# Patient Record
Sex: Female | Born: 1955
Health system: Southern US, Community
[De-identification: ages and names within clinical notes are randomized; demographics above are authoritative.]

## PROBLEM LIST (undated history)

## (undated) DIAGNOSIS — E119 Type 2 diabetes mellitus without complications: Secondary | ICD-10-CM

## (undated) DIAGNOSIS — I10 Essential (primary) hypertension: Secondary | ICD-10-CM

## (undated) DIAGNOSIS — M5431 Sciatica, right side: Secondary | ICD-10-CM

## (undated) DIAGNOSIS — N3090 Cystitis, unspecified without hematuria: Secondary | ICD-10-CM

## (undated) HISTORY — DX: Cystitis, unspecified without hematuria: N30.90

## (undated) HISTORY — DX: Sciatica, right side: M54.31

## (undated) HISTORY — DX: Essential (primary) hypertension: I10

## (undated) HISTORY — PX: CATARACT EXTRACTION, BILATERAL: SHX1313

## (undated) HISTORY — PX: TONSILLECTOMY AND ADENOIDECTOMY: SUR1326

---
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
## 1998-06-25 HISTORY — PX: APPENDECTOMY: SHX54

## 1998-06-25 HISTORY — PX: CHOLECYSTECTOMY: SHX55

## 2012-09-04 DIAGNOSIS — Z9102 Food additives allergy status: Secondary | ICD-10-CM

## 2012-09-04 DIAGNOSIS — Z91010 Allergy to peanuts: Secondary | ICD-10-CM | POA: Insufficient documentation

## 2012-09-04 DIAGNOSIS — K9049 Malabsorption due to intolerance, not elsewhere classified: Secondary | ICD-10-CM | POA: Insufficient documentation

## 2012-09-04 HISTORY — DX: Food additives allergy status: Z91.02

## 2012-09-04 HISTORY — DX: Allergy to peanuts: Z91.010

## 2012-10-16 DIAGNOSIS — E559 Vitamin D deficiency, unspecified: Secondary | ICD-10-CM | POA: Insufficient documentation

## 2013-10-28 DIAGNOSIS — M2550 Pain in unspecified joint: Secondary | ICD-10-CM | POA: Insufficient documentation

## 2013-10-28 DIAGNOSIS — I6529 Occlusion and stenosis of unspecified carotid artery: Secondary | ICD-10-CM | POA: Insufficient documentation

## 2013-10-28 HISTORY — DX: Occlusion and stenosis of unspecified carotid artery: I65.29

## 2014-05-12 DIAGNOSIS — G629 Polyneuropathy, unspecified: Secondary | ICD-10-CM | POA: Insufficient documentation

## 2014-05-12 DIAGNOSIS — T452X1A Poisoning by vitamins, accidental (unintentional), initial encounter: Secondary | ICD-10-CM | POA: Insufficient documentation

## 2014-05-12 HISTORY — DX: Polyneuropathy, unspecified: G62.9

## 2014-05-12 HISTORY — DX: Poisoning by vitamins, accidental (unintentional), initial encounter: T45.2X1A

## 2016-08-04 DIAGNOSIS — J209 Acute bronchitis, unspecified: Secondary | ICD-10-CM | POA: Diagnosis not present

## 2016-08-14 DIAGNOSIS — F0789 Other personality and behavioral disorders due to known physiological condition: Secondary | ICD-10-CM | POA: Diagnosis not present

## 2016-08-14 DIAGNOSIS — E782 Mixed hyperlipidemia: Secondary | ICD-10-CM | POA: Diagnosis not present

## 2016-08-14 DIAGNOSIS — I10 Essential (primary) hypertension: Secondary | ICD-10-CM | POA: Diagnosis not present

## 2016-08-14 DIAGNOSIS — E109 Type 1 diabetes mellitus without complications: Secondary | ICD-10-CM | POA: Diagnosis not present

## 2016-08-17 DIAGNOSIS — E559 Vitamin D deficiency, unspecified: Secondary | ICD-10-CM | POA: Diagnosis not present

## 2016-08-17 DIAGNOSIS — I10 Essential (primary) hypertension: Secondary | ICD-10-CM | POA: Diagnosis not present

## 2016-08-17 DIAGNOSIS — E119 Type 2 diabetes mellitus without complications: Secondary | ICD-10-CM | POA: Diagnosis not present

## 2016-08-17 DIAGNOSIS — E782 Mixed hyperlipidemia: Secondary | ICD-10-CM | POA: Diagnosis not present

## 2016-08-27 DIAGNOSIS — E876 Hypokalemia: Secondary | ICD-10-CM | POA: Diagnosis not present

## 2016-08-27 DIAGNOSIS — E119 Type 2 diabetes mellitus without complications: Secondary | ICD-10-CM | POA: Diagnosis not present

## 2016-08-27 DIAGNOSIS — J018 Other acute sinusitis: Secondary | ICD-10-CM | POA: Diagnosis not present

## 2016-08-27 DIAGNOSIS — J4541 Moderate persistent asthma with (acute) exacerbation: Secondary | ICD-10-CM | POA: Diagnosis not present

## 2016-08-28 DIAGNOSIS — J4541 Moderate persistent asthma with (acute) exacerbation: Secondary | ICD-10-CM | POA: Diagnosis not present

## 2016-10-01 DIAGNOSIS — G243 Spasmodic torticollis: Secondary | ICD-10-CM | POA: Diagnosis not present

## 2016-12-14 DIAGNOSIS — I10 Essential (primary) hypertension: Secondary | ICD-10-CM | POA: Diagnosis not present

## 2016-12-14 DIAGNOSIS — E119 Type 2 diabetes mellitus without complications: Secondary | ICD-10-CM | POA: Diagnosis not present

## 2016-12-14 DIAGNOSIS — Y800 Diagnostic and monitoring physical medicine devices associated with adverse incidents: Secondary | ICD-10-CM | POA: Diagnosis not present

## 2016-12-14 DIAGNOSIS — E782 Mixed hyperlipidemia: Secondary | ICD-10-CM | POA: Diagnosis not present

## 2017-01-07 DIAGNOSIS — Z882 Allergy status to sulfonamides status: Secondary | ICD-10-CM | POA: Diagnosis not present

## 2017-01-07 DIAGNOSIS — E119 Type 2 diabetes mellitus without complications: Secondary | ICD-10-CM | POA: Diagnosis not present

## 2017-01-07 DIAGNOSIS — G243 Spasmodic torticollis: Secondary | ICD-10-CM | POA: Diagnosis not present

## 2017-01-07 DIAGNOSIS — G248 Other dystonia: Secondary | ICD-10-CM | POA: Diagnosis not present

## 2017-04-03 DIAGNOSIS — G243 Spasmodic torticollis: Secondary | ICD-10-CM | POA: Insufficient documentation

## 2017-04-03 HISTORY — DX: Spasmodic torticollis: G24.3

## 2017-04-15 DIAGNOSIS — G243 Spasmodic torticollis: Secondary | ICD-10-CM | POA: Diagnosis not present

## 2017-04-15 DIAGNOSIS — Z885 Allergy status to narcotic agent status: Secondary | ICD-10-CM | POA: Diagnosis not present

## 2017-04-15 DIAGNOSIS — E119 Type 2 diabetes mellitus without complications: Secondary | ICD-10-CM | POA: Diagnosis not present

## 2017-04-15 DIAGNOSIS — Z888 Allergy status to other drugs, medicaments and biological substances status: Secondary | ICD-10-CM | POA: Diagnosis not present

## 2017-04-15 DIAGNOSIS — I10 Essential (primary) hypertension: Secondary | ICD-10-CM | POA: Diagnosis not present

## 2017-04-15 DIAGNOSIS — Z880 Allergy status to penicillin: Secondary | ICD-10-CM | POA: Diagnosis not present

## 2017-04-15 DIAGNOSIS — J4541 Moderate persistent asthma with (acute) exacerbation: Secondary | ICD-10-CM | POA: Diagnosis not present

## 2017-04-15 DIAGNOSIS — Z79899 Other long term (current) drug therapy: Secondary | ICD-10-CM | POA: Diagnosis not present

## 2017-04-15 DIAGNOSIS — Z882 Allergy status to sulfonamides status: Secondary | ICD-10-CM | POA: Diagnosis not present

## 2017-04-15 DIAGNOSIS — Z91048 Other nonmedicinal substance allergy status: Secondary | ICD-10-CM | POA: Diagnosis not present

## 2017-04-15 DIAGNOSIS — Z794 Long term (current) use of insulin: Secondary | ICD-10-CM | POA: Diagnosis not present

## 2017-04-15 DIAGNOSIS — K219 Gastro-esophageal reflux disease without esophagitis: Secondary | ICD-10-CM | POA: Diagnosis not present

## 2017-04-15 DIAGNOSIS — E782 Mixed hyperlipidemia: Secondary | ICD-10-CM | POA: Diagnosis not present

## 2017-04-30 DIAGNOSIS — Z0000 Encounter for general adult medical examination without abnormal findings: Secondary | ICD-10-CM | POA: Diagnosis not present

## 2017-04-30 DIAGNOSIS — Z6830 Body mass index (BMI) 30.0-30.9, adult: Secondary | ICD-10-CM | POA: Diagnosis not present

## 2017-04-30 DIAGNOSIS — Z124 Encounter for screening for malignant neoplasm of cervix: Secondary | ICD-10-CM | POA: Diagnosis not present

## 2017-05-09 DIAGNOSIS — Z1231 Encounter for screening mammogram for malignant neoplasm of breast: Secondary | ICD-10-CM | POA: Diagnosis not present

## 2017-05-24 DIAGNOSIS — J4541 Moderate persistent asthma with (acute) exacerbation: Secondary | ICD-10-CM | POA: Diagnosis not present

## 2017-05-27 DIAGNOSIS — H26493 Other secondary cataract, bilateral: Secondary | ICD-10-CM | POA: Diagnosis not present

## 2017-05-27 DIAGNOSIS — H40003 Preglaucoma, unspecified, bilateral: Secondary | ICD-10-CM | POA: Diagnosis not present

## 2017-05-27 DIAGNOSIS — H04123 Dry eye syndrome of bilateral lacrimal glands: Secondary | ICD-10-CM | POA: Diagnosis not present

## 2017-05-27 DIAGNOSIS — E119 Type 2 diabetes mellitus without complications: Secondary | ICD-10-CM | POA: Diagnosis not present

## 2017-07-17 DIAGNOSIS — H40053 Ocular hypertension, bilateral: Secondary | ICD-10-CM | POA: Diagnosis not present

## 2017-07-29 DIAGNOSIS — G243 Spasmodic torticollis: Secondary | ICD-10-CM | POA: Diagnosis not present

## 2017-08-29 DIAGNOSIS — H40053 Ocular hypertension, bilateral: Secondary | ICD-10-CM | POA: Diagnosis not present

## 2017-10-10 DIAGNOSIS — H40053 Ocular hypertension, bilateral: Secondary | ICD-10-CM | POA: Diagnosis not present

## 2017-11-04 DIAGNOSIS — G243 Spasmodic torticollis: Secondary | ICD-10-CM | POA: Diagnosis not present

## 2018-02-10 DIAGNOSIS — E119 Type 2 diabetes mellitus without complications: Secondary | ICD-10-CM | POA: Diagnosis not present

## 2018-02-10 DIAGNOSIS — Z880 Allergy status to penicillin: Secondary | ICD-10-CM | POA: Diagnosis not present

## 2018-02-10 DIAGNOSIS — Z882 Allergy status to sulfonamides status: Secondary | ICD-10-CM | POA: Diagnosis not present

## 2018-02-10 DIAGNOSIS — G243 Spasmodic torticollis: Secondary | ICD-10-CM | POA: Diagnosis not present

## 2018-02-21 LAB — FECAL OCCULT BLOOD, IMMUNOCHEMICAL: IFOBT: NEGATIVE

## 2018-02-25 DIAGNOSIS — Z0000 Encounter for general adult medical examination without abnormal findings: Secondary | ICD-10-CM | POA: Diagnosis not present

## 2018-04-02 DIAGNOSIS — N3001 Acute cystitis with hematuria: Secondary | ICD-10-CM | POA: Diagnosis not present

## 2018-04-16 DIAGNOSIS — H40053 Ocular hypertension, bilateral: Secondary | ICD-10-CM | POA: Diagnosis not present

## 2018-04-17 DIAGNOSIS — N3000 Acute cystitis without hematuria: Secondary | ICD-10-CM | POA: Diagnosis not present

## 2018-05-01 DIAGNOSIS — I10 Essential (primary) hypertension: Secondary | ICD-10-CM | POA: Diagnosis not present

## 2018-05-01 DIAGNOSIS — J4541 Moderate persistent asthma with (acute) exacerbation: Secondary | ICD-10-CM | POA: Diagnosis not present

## 2018-05-01 DIAGNOSIS — E1169 Type 2 diabetes mellitus with other specified complication: Secondary | ICD-10-CM | POA: Diagnosis not present

## 2018-05-01 DIAGNOSIS — R790 Abnormal level of blood mineral: Secondary | ICD-10-CM | POA: Diagnosis not present

## 2018-05-01 DIAGNOSIS — Z6831 Body mass index (BMI) 31.0-31.9, adult: Secondary | ICD-10-CM | POA: Diagnosis not present

## 2018-05-01 DIAGNOSIS — E782 Mixed hyperlipidemia: Secondary | ICD-10-CM | POA: Diagnosis not present

## 2018-05-01 DIAGNOSIS — Z0000 Encounter for general adult medical examination without abnormal findings: Secondary | ICD-10-CM | POA: Diagnosis not present

## 2018-05-06 DIAGNOSIS — E119 Type 2 diabetes mellitus without complications: Secondary | ICD-10-CM | POA: Diagnosis not present

## 2018-05-12 DIAGNOSIS — G243 Spasmodic torticollis: Secondary | ICD-10-CM | POA: Diagnosis not present

## 2018-05-12 DIAGNOSIS — Z888 Allergy status to other drugs, medicaments and biological substances status: Secondary | ICD-10-CM | POA: Diagnosis not present

## 2018-05-12 DIAGNOSIS — K219 Gastro-esophageal reflux disease without esophagitis: Secondary | ICD-10-CM | POA: Diagnosis not present

## 2018-05-12 DIAGNOSIS — E119 Type 2 diabetes mellitus without complications: Secondary | ICD-10-CM | POA: Diagnosis not present

## 2018-05-12 DIAGNOSIS — Z880 Allergy status to penicillin: Secondary | ICD-10-CM | POA: Diagnosis not present

## 2018-05-12 DIAGNOSIS — Z882 Allergy status to sulfonamides status: Secondary | ICD-10-CM | POA: Diagnosis not present

## 2018-05-19 DIAGNOSIS — N3001 Acute cystitis with hematuria: Secondary | ICD-10-CM | POA: Diagnosis not present

## 2018-05-19 DIAGNOSIS — N3000 Acute cystitis without hematuria: Secondary | ICD-10-CM | POA: Diagnosis not present

## 2018-05-26 DIAGNOSIS — Z1231 Encounter for screening mammogram for malignant neoplasm of breast: Secondary | ICD-10-CM | POA: Diagnosis not present

## 2018-06-04 DIAGNOSIS — E119 Type 2 diabetes mellitus without complications: Secondary | ICD-10-CM | POA: Diagnosis not present

## 2018-06-20 DIAGNOSIS — N3000 Acute cystitis without hematuria: Secondary | ICD-10-CM | POA: Diagnosis not present

## 2018-06-20 DIAGNOSIS — N3001 Acute cystitis with hematuria: Secondary | ICD-10-CM | POA: Diagnosis not present

## 2018-06-30 DIAGNOSIS — E119 Type 2 diabetes mellitus without complications: Secondary | ICD-10-CM | POA: Diagnosis not present

## 2018-07-28 DIAGNOSIS — E119 Type 2 diabetes mellitus without complications: Secondary | ICD-10-CM | POA: Diagnosis not present

## 2018-07-29 DIAGNOSIS — N3000 Acute cystitis without hematuria: Secondary | ICD-10-CM | POA: Diagnosis not present

## 2018-08-06 DIAGNOSIS — B373 Candidiasis of vulva and vagina: Secondary | ICD-10-CM | POA: Diagnosis not present

## 2018-08-06 DIAGNOSIS — Z889 Allergy status to unspecified drugs, medicaments and biological substances status: Secondary | ICD-10-CM | POA: Diagnosis not present

## 2018-08-06 DIAGNOSIS — N390 Urinary tract infection, site not specified: Secondary | ICD-10-CM | POA: Diagnosis not present

## 2018-08-06 DIAGNOSIS — Z79899 Other long term (current) drug therapy: Secondary | ICD-10-CM | POA: Diagnosis not present

## 2018-08-08 DIAGNOSIS — M5431 Sciatica, right side: Secondary | ICD-10-CM | POA: Diagnosis not present

## 2018-08-14 DIAGNOSIS — M5431 Sciatica, right side: Secondary | ICD-10-CM | POA: Diagnosis not present

## 2018-08-18 DIAGNOSIS — Z881 Allergy status to other antibiotic agents status: Secondary | ICD-10-CM | POA: Diagnosis not present

## 2018-08-18 DIAGNOSIS — Z884 Allergy status to anesthetic agent status: Secondary | ICD-10-CM | POA: Diagnosis not present

## 2018-08-18 DIAGNOSIS — Z888 Allergy status to other drugs, medicaments and biological substances status: Secondary | ICD-10-CM | POA: Diagnosis not present

## 2018-08-18 DIAGNOSIS — Z880 Allergy status to penicillin: Secondary | ICD-10-CM | POA: Diagnosis not present

## 2018-08-18 DIAGNOSIS — E119 Type 2 diabetes mellitus without complications: Secondary | ICD-10-CM | POA: Diagnosis not present

## 2018-08-18 DIAGNOSIS — Z882 Allergy status to sulfonamides status: Secondary | ICD-10-CM | POA: Diagnosis not present

## 2018-08-18 DIAGNOSIS — G243 Spasmodic torticollis: Secondary | ICD-10-CM | POA: Diagnosis not present

## 2018-08-20 DIAGNOSIS — M545 Low back pain: Secondary | ICD-10-CM | POA: Diagnosis not present

## 2018-08-20 DIAGNOSIS — M5441 Lumbago with sciatica, right side: Secondary | ICD-10-CM | POA: Diagnosis not present

## 2018-08-21 DIAGNOSIS — E119 Type 2 diabetes mellitus without complications: Secondary | ICD-10-CM | POA: Diagnosis not present

## 2018-08-25 DIAGNOSIS — M62838 Other muscle spasm: Secondary | ICD-10-CM | POA: Diagnosis not present

## 2018-08-25 DIAGNOSIS — M5441 Lumbago with sciatica, right side: Secondary | ICD-10-CM | POA: Diagnosis not present

## 2018-08-25 DIAGNOSIS — M545 Low back pain: Secondary | ICD-10-CM | POA: Diagnosis not present

## 2018-09-02 DIAGNOSIS — N390 Urinary tract infection, site not specified: Secondary | ICD-10-CM | POA: Diagnosis not present

## 2018-09-02 DIAGNOSIS — B373 Candidiasis of vulva and vagina: Secondary | ICD-10-CM | POA: Diagnosis not present

## 2018-09-03 DIAGNOSIS — M5441 Lumbago with sciatica, right side: Secondary | ICD-10-CM | POA: Diagnosis not present

## 2018-09-03 DIAGNOSIS — M545 Low back pain: Secondary | ICD-10-CM | POA: Diagnosis not present

## 2018-09-09 DIAGNOSIS — F0789 Other personality and behavioral disorders due to known physiological condition: Secondary | ICD-10-CM | POA: Diagnosis not present

## 2018-09-09 DIAGNOSIS — E782 Mixed hyperlipidemia: Secondary | ICD-10-CM | POA: Diagnosis not present

## 2018-09-09 DIAGNOSIS — I10 Essential (primary) hypertension: Secondary | ICD-10-CM | POA: Diagnosis not present

## 2018-09-09 DIAGNOSIS — E1169 Type 2 diabetes mellitus with other specified complication: Secondary | ICD-10-CM | POA: Diagnosis not present

## 2018-09-10 DIAGNOSIS — M545 Low back pain: Secondary | ICD-10-CM | POA: Diagnosis not present

## 2018-09-10 DIAGNOSIS — M5441 Lumbago with sciatica, right side: Secondary | ICD-10-CM | POA: Diagnosis not present

## 2018-09-15 DIAGNOSIS — E119 Type 2 diabetes mellitus without complications: Secondary | ICD-10-CM | POA: Diagnosis not present

## 2018-10-11 DIAGNOSIS — E119 Type 2 diabetes mellitus without complications: Secondary | ICD-10-CM | POA: Diagnosis not present

## 2018-10-27 DIAGNOSIS — Z889 Allergy status to unspecified drugs, medicaments and biological substances status: Secondary | ICD-10-CM | POA: Diagnosis not present

## 2018-10-27 DIAGNOSIS — N390 Urinary tract infection, site not specified: Secondary | ICD-10-CM | POA: Diagnosis not present

## 2018-11-12 DIAGNOSIS — E119 Type 2 diabetes mellitus without complications: Secondary | ICD-10-CM | POA: Diagnosis not present

## 2018-11-13 DIAGNOSIS — E119 Type 2 diabetes mellitus without complications: Secondary | ICD-10-CM | POA: Diagnosis not present

## 2018-11-24 DIAGNOSIS — E119 Type 2 diabetes mellitus without complications: Secondary | ICD-10-CM | POA: Diagnosis not present

## 2018-11-24 DIAGNOSIS — G243 Spasmodic torticollis: Secondary | ICD-10-CM | POA: Diagnosis not present

## 2018-11-24 DIAGNOSIS — K219 Gastro-esophageal reflux disease without esophagitis: Secondary | ICD-10-CM | POA: Diagnosis not present

## 2018-12-04 DIAGNOSIS — N390 Urinary tract infection, site not specified: Secondary | ICD-10-CM | POA: Diagnosis not present

## 2018-12-04 DIAGNOSIS — B373 Candidiasis of vulva and vagina: Secondary | ICD-10-CM | POA: Diagnosis not present

## 2018-12-08 DIAGNOSIS — E119 Type 2 diabetes mellitus without complications: Secondary | ICD-10-CM | POA: Diagnosis not present

## 2019-01-03 DIAGNOSIS — E119 Type 2 diabetes mellitus without complications: Secondary | ICD-10-CM | POA: Diagnosis not present

## 2019-01-05 DIAGNOSIS — N3000 Acute cystitis without hematuria: Secondary | ICD-10-CM | POA: Diagnosis not present

## 2019-01-05 DIAGNOSIS — N3001 Acute cystitis with hematuria: Secondary | ICD-10-CM | POA: Diagnosis not present

## 2019-01-05 DIAGNOSIS — B3741 Candidal cystitis and urethritis: Secondary | ICD-10-CM | POA: Diagnosis not present

## 2019-01-22 DIAGNOSIS — H40053 Ocular hypertension, bilateral: Secondary | ICD-10-CM | POA: Diagnosis not present

## 2019-02-05 DIAGNOSIS — E119 Type 2 diabetes mellitus without complications: Secondary | ICD-10-CM | POA: Diagnosis not present

## 2019-03-09 DIAGNOSIS — Z880 Allergy status to penicillin: Secondary | ICD-10-CM | POA: Diagnosis not present

## 2019-03-09 DIAGNOSIS — E119 Type 2 diabetes mellitus without complications: Secondary | ICD-10-CM | POA: Diagnosis not present

## 2019-03-09 DIAGNOSIS — G243 Spasmodic torticollis: Secondary | ICD-10-CM | POA: Diagnosis not present

## 2019-03-09 DIAGNOSIS — Z882 Allergy status to sulfonamides status: Secondary | ICD-10-CM | POA: Diagnosis not present

## 2019-03-12 DIAGNOSIS — E119 Type 2 diabetes mellitus without complications: Secondary | ICD-10-CM | POA: Diagnosis not present

## 2019-03-19 DIAGNOSIS — E1169 Type 2 diabetes mellitus with other specified complication: Secondary | ICD-10-CM | POA: Diagnosis not present

## 2019-03-19 DIAGNOSIS — I10 Essential (primary) hypertension: Secondary | ICD-10-CM | POA: Diagnosis not present

## 2019-03-19 DIAGNOSIS — J4540 Moderate persistent asthma, uncomplicated: Secondary | ICD-10-CM | POA: Diagnosis not present

## 2019-03-19 DIAGNOSIS — Z1159 Encounter for screening for other viral diseases: Secondary | ICD-10-CM | POA: Diagnosis not present

## 2019-05-29 DIAGNOSIS — E119 Type 2 diabetes mellitus without complications: Secondary | ICD-10-CM | POA: Diagnosis not present

## 2019-06-08 DIAGNOSIS — G243 Spasmodic torticollis: Secondary | ICD-10-CM | POA: Diagnosis not present

## 2019-06-08 DIAGNOSIS — K219 Gastro-esophageal reflux disease without esophagitis: Secondary | ICD-10-CM | POA: Diagnosis not present

## 2019-06-08 DIAGNOSIS — E119 Type 2 diabetes mellitus without complications: Secondary | ICD-10-CM | POA: Diagnosis not present

## 2019-06-09 DIAGNOSIS — Z1231 Encounter for screening mammogram for malignant neoplasm of breast: Secondary | ICD-10-CM | POA: Diagnosis not present

## 2019-06-09 LAB — HM MAMMOGRAPHY

## 2019-06-15 DIAGNOSIS — R05 Cough: Secondary | ICD-10-CM | POA: Diagnosis not present

## 2019-06-15 DIAGNOSIS — Z20828 Contact with and (suspected) exposure to other viral communicable diseases: Secondary | ICD-10-CM | POA: Diagnosis not present

## 2019-06-15 DIAGNOSIS — R509 Fever, unspecified: Secondary | ICD-10-CM | POA: Diagnosis not present

## 2019-06-24 DIAGNOSIS — J018 Other acute sinusitis: Secondary | ICD-10-CM | POA: Diagnosis not present

## 2019-06-24 DIAGNOSIS — Z1159 Encounter for screening for other viral diseases: Secondary | ICD-10-CM | POA: Diagnosis not present

## 2019-06-24 DIAGNOSIS — R197 Diarrhea, unspecified: Secondary | ICD-10-CM | POA: Diagnosis not present

## 2019-06-24 DIAGNOSIS — Z6830 Body mass index (BMI) 30.0-30.9, adult: Secondary | ICD-10-CM | POA: Diagnosis not present

## 2019-06-29 DIAGNOSIS — E119 Type 2 diabetes mellitus without complications: Secondary | ICD-10-CM | POA: Diagnosis not present

## 2019-07-29 DIAGNOSIS — H40053 Ocular hypertension, bilateral: Secondary | ICD-10-CM | POA: Diagnosis not present

## 2019-09-14 DIAGNOSIS — G243 Spasmodic torticollis: Secondary | ICD-10-CM | POA: Diagnosis not present

## 2019-09-16 ENCOUNTER — Ambulatory Visit: Payer: Medicare Other | Admitting: Legal Medicine

## 2019-09-16 ENCOUNTER — Encounter: Payer: Self-pay | Admitting: Legal Medicine

## 2019-09-16 ENCOUNTER — Other Ambulatory Visit: Payer: Self-pay

## 2019-09-16 DIAGNOSIS — J4540 Moderate persistent asthma, uncomplicated: Secondary | ICD-10-CM

## 2019-09-16 DIAGNOSIS — E1121 Type 2 diabetes mellitus with diabetic nephropathy: Secondary | ICD-10-CM | POA: Insufficient documentation

## 2019-09-16 DIAGNOSIS — I10 Essential (primary) hypertension: Secondary | ICD-10-CM | POA: Diagnosis not present

## 2019-09-16 DIAGNOSIS — Z794 Long term (current) use of insulin: Secondary | ICD-10-CM | POA: Diagnosis not present

## 2019-09-16 DIAGNOSIS — E1169 Type 2 diabetes mellitus with other specified complication: Secondary | ICD-10-CM | POA: Diagnosis not present

## 2019-09-16 HISTORY — DX: Type 2 diabetes mellitus with other specified complication: E11.69

## 2019-09-16 HISTORY — DX: Essential (primary) hypertension: I10

## 2019-09-16 HISTORY — DX: Moderate persistent asthma, uncomplicated: J45.40

## 2019-09-16 NOTE — Assessment & Plan Note (Signed)
This patient has asthma moderate and is on albuterol.  Patient is not having a flair.  Chronic medicines include albuterol. Addition new medicines none.  Asthma action plan is in place.  

## 2019-09-16 NOTE — Assessment & Plan Note (Signed)

## 2019-09-16 NOTE — Assessment & Plan Note (Signed)

## 2019-09-16 NOTE — Progress Notes (Signed)
Established Patient Office Visit  Subjective:  Patient ID: Suzanne Hardin, female    DOB: 1956-06-09  Age: 64 y.o. MRN: [id number]  CC:  Chief Complaint  Patient presents with  . Hypertension  . Diabetes    HPI Suzanne Hardin presents for Chronic visit.  Patient presents for follow up of hypertension.  Patient tolerating diet well with side effects.  Patient was diagnosed with hypertension 2015 so has been treated for hypertension for 5 years.Patient is working on maintaining diet and exercise regimen and follows up as directed. Complication include none.  Patient present with type 2 diabetes.  Specifically, this is type 2, insulin dependent requiring diabetes, complicated by hypertension.  Compliance with treatment has been good; patient take medicines as directed, maintains diet and exercise regimen, follows up as directed, and is keeping glucose diary.  Date of  diagnosis 2015.  Depression screen has been performed.Tobacco screen nonsmoker. Current medicines for diabetes toujeo.  Patient is on none for renal protection and none for cholesterol control.  Patient performs foot exams daily and last ophthalmologic exam was I year ago.  He refuses ACE and statins.  Past Medical History:  Diagnosis Date  . Cystitis   . Diabetes mellitus without complication ([place])   . Essential hypertension 09/16/2019  . Hypertension   . Moderate persistent asthma 09/16/2019  . Sciatica of right side   . Type 2 diabetes mellitus with other specified complication ([place]) 12/29/2374    History reviewed. No pertinent surgical history.  History reviewed. No pertinent family history.  Social History   Socioeconomic History  . Marital status: Divorced    Spouse name: Not on file  . Number of children: Not on file  . Years of education: Not on file  . Highest education level: Not on file  Occupational History  . Not on file  Tobacco Use  . Smoking status: Never Smoker  . Smokeless tobacco: Never  Used  Substance and Sexual Activity  . Alcohol use: Never  . Drug use: Never  . Sexual activity: Not on file  Other Topics Concern  . Not on file  Social History Narrative  . Not on file   Social Determinants of Health   Financial Resource Strain:   . Difficulty of Paying Living Expenses:   Food Insecurity:   . Worried About Charity fundraiser in the Last Year:   . Arboriculturist in the Last Year:   Transportation Needs:   . Film/video editor (Medical):   Marland Kitchen Lack of Transportation (Non-Medical):   Physical Activity:   . Days of Exercise per Week:   . Minutes of Exercise per Session:   Stress:   . Feeling of Stress :   Social Connections:   . Frequency of Communication with Friends and Family:   . Frequency of Social Gatherings with Friends and Family:   . Attends Religious Services:   . Active Member of Clubs or Organizations:   . Attends Archivist Meetings:   Marland Kitchen Marital Status:   Intimate Partner Violence:   . Fear of Current or Ex-Partner:   . Emotionally Abused:   Marland Kitchen Physically Abused:   . Sexually Abused:     Outpatient Medications Prior to Visit  Medication Sig Dispense Refill  . baclofen (LIORESAL) 10 MG tablet Take 10 mg by mouth 3 (three) times daily.    Marland Kitchen glucose blood (CONTOUR NEXT TEST) test strip 1 each by Other route as needed for other.  Use as instructed    . insulin aspart (NOVOLOG) 100 UNIT/ML injection Inject into the skin 3 (three) times daily before meals. Sliding scale    . Lancets MISC by Does not apply route.    Nelva Nay SOLOSTAR 300 UNIT/ML Solostar Pen SMARTSIG:20 Unit(s) SUB-Q Twice Daily    . cyclobenzaprine (FLEXERIL) 10 MG tablet Take 10 mg by mouth 3 (three) times daily as needed for muscle spasms.    Marland Kitchen losartan (COZAAR) 100 MG tablet Take 100 mg by mouth daily.     No facility-administered medications prior to visit.    Allergies  Allergen Reactions  . Cymbalta [Duloxetine Hcl]   . Darvon [Propoxyphene]   .  Gentamicin Sulfate   . Hctz [Hydrochlorothiazide]   . Humalog [Insulin Lispro]   . Keflex [Cephalexin]   . Lasix [Furosemide]   . Macrodantin [Nitrofurantoin]   . Mucinex [Guaifenesin Er]   . Peanut-Containing Drug Products   . Penicillins   . Sulfa Antibiotics   . Tessalon Perles [Benzonatate]   . Travatan [Travoprost]   . Tylenol [Acetaminophen]     ROS Review of Systems  Constitutional: Negative.   HENT: Negative.   Eyes: Negative.   Respiratory: Negative.   Cardiovascular: Negative.   Gastrointestinal: Negative.   Endocrine: Negative.   Genitourinary: Negative.   Musculoskeletal: Negative.   Neurological: Negative.   Hematological: Negative.   Psychiatric/Behavioral: Negative.       Objective:    Physical Exam  Constitutional: She is oriented to person, place, and time. She appears well-developed and well-nourished.  HENT:  Head: Normocephalic and atraumatic.  Eyes: Pupils are equal, round, and reactive to light. Conjunctivae and EOM are normal.  Cardiovascular: Normal rate, regular rhythm and normal heart sounds.  Pulmonary/Chest: Breath sounds normal.  Abdominal: Soft. Bowel sounds are normal.  Musculoskeletal:        General: Normal range of motion.     Cervical back: Normal range of motion and neck supple.  Neurological: She is alert and oriented to person, place, and time. She has normal reflexes.  Skin: Skin is warm and dry.  Psychiatric: She has a normal mood and affect.  Vitals reviewed.  Diabetic Foot Exam - Simple   Simple Foot Form Diabetic Foot exam was performed with the following findings: Yes 09/16/2019  8:17 AM  Visual Inspection No deformities, no ulcerations, no other skin breakdown bilaterally: Yes Sensation Testing Intact to touch and monofilament testing bilaterally: Yes Pulse Check Posterior Tibialis and Dorsalis pulse intact bilaterally: Yes Comments      BP 128/72   Pulse 84   Temp 98.2 F (36.8 C)   Resp 16   Ht '5\' 6"'   (1.676 m)   Wt 199 lb 9.6 oz (90.5 kg)   SpO2 96%   BMI 32.22 kg/m  Wt Readings from Last 3 Encounters:  09/16/19 199 lb 9.6 oz (90.5 kg)     Health Maintenance Due  Topic Date Due  . HEMOGLOBIN A1C  Never done  . Hepatitis C Screening  Never done  . PNEUMOCOCCAL POLYSACCHARIDE VACCINE AGE 46-64 HIGH RISK  Never done  . OPHTHALMOLOGY EXAM  Never done  . URINE MICROALBUMIN  Never done  . HIV Screening  Never done  . TETANUS/TDAP  Never done  . PAP SMEAR-Modifier  Never done  . MAMMOGRAM  Never done  . COLONOSCOPY  Never done  . INFLUENZA VACCINE  Never done    There are no preventive care reminders to display for this patient.  No results found for: TSH No results found for: WBC, HGB, HCT, MCV, PLT No results found for: NA, K, CHLORIDE, CO2, GLUCOSE, BUN, CREATININE, BILITOT, ALKPHOS, AST, ALT, PROT, ALBUMIN, CALCIUM, ANIONGAP, EGFR, GFR No results found for: CHOL No results found for: HDL No results found for: LDLCALC No results found for: TRIG No results found for: CHOLHDL No results found for: HGBA1C    Assessment & Plan:   Problem List Items Addressed This Visit      Cardiovascular and Mediastinum   Essential hypertension    .An individual care plan was established and reinforced today.  The patient's status was assessed using clinical findings on exam and labs or diagnostic tests. The patient's success at meeting treatment goals on disease specific evidence-based guidelines and found to be well controlled. SELF MANAGEMENT: The patient and I together assessed ways to personally work towards obtaining the recommended goals. RECOMMENDATIONS: avoid decongestants found in common cold remedies, decrease consumption of alcohol, perform routine monitoring of BP with home BP cuff, exercise, reduction of dietary salt, take medicines as prescribed, try not to miss doses and quit smoking.  Regular exercise and maintaining a healthy weight is needed.  Stress reduction may help. A  CLINICAL SUMMARY including written plan identify barriers to care unique to individual due to social or financial issues.  We attempt to mutually creat solutions for individual and family understanding.      Relevant Orders   CBC with Differential   Comprehensive metabolic panel     Respiratory   Moderate persistent asthma    This patient has asthma moderate and is on albuterol.  Patient is not having a flair.  Chronic medicines include albuterol. Addition new medicines none.  Asthma action plan is in place.         Endocrine   Type 2 diabetes mellitus with other specified complication [facility])    An individual care plan was established and reinforced today.  The patient's status was assessed using clinical findings on exam, labs and diagnostic testing. Patient success at meeting goals based on disease specific evidence-based guidelines and found to be good controlled. Medications were assessed and patient's understanding of the medical issues , including barriers were assessed. Recommend adherence to a diabetic diet, a graduated exercise program, HgbA1c level is checked quarterly, and urine microalbumin performed yearly .  Annual mono-filament sensation testing performed. Lower blood pressure and control hyperlipidemia is important. Get annual eye exams and annual flu shots and smoking cessation discussed.  Self management goals were discussed.      Relevant Medications   TOUJEO SOLOSTAR 300 UNIT/ML Solostar Pen   insulin aspart (NOVOLOG) 100 UNIT/ML injection   Other Relevant Orders   Lipid Panel   Hemoglobin A1c      No orders of the defined types were placed in this encounter.   Follow-up: Return in about 4 months (around 01/16/2020) for fasting.    Reinaldo Meeker, MD

## 2019-09-17 ENCOUNTER — Other Ambulatory Visit: Payer: Self-pay | Admitting: Legal Medicine

## 2019-09-17 LAB — CBC WITH DIFFERENTIAL/PLATELET
Basophils Absolute: 0 10*3/uL (ref 0.0–0.2)
Basos: 1 %
EOS (ABSOLUTE): 0.2 10*3/uL (ref 0.0–0.4)
Eos: 3 %
Hematocrit: 40.4 % (ref 34.0–46.6)
Hemoglobin: 13.1 g/dL (ref 11.1–15.9)
Immature Grans (Abs): 0 10*3/uL (ref 0.0–0.1)
Immature Granulocytes: 0 %
Lymphocytes Absolute: 2.6 10*3/uL (ref 0.7–3.1)
Lymphs: 33 %
MCH: 27 pg (ref 26.6–33.0)
MCHC: 32.4 g/dL (ref 31.5–35.7)
MCV: 83 fL (ref 79–97)
Monocytes Absolute: 0.4 10*3/uL (ref 0.1–0.9)
Monocytes: 5 %
Neutrophils Absolute: 4.6 10*3/uL (ref 1.4–7.0)
Neutrophils: 58 %
Platelets: 334 10*3/uL (ref 150–450)
RBC: 4.85 x10E6/uL (ref 3.77–5.28)
RDW: 12.6 % (ref 11.7–15.4)
WBC: 7.9 10*3/uL (ref 3.4–10.8)

## 2019-09-17 LAB — COMPREHENSIVE METABOLIC PANEL
ALT: 20 IU/L (ref 0–32)
AST: 19 IU/L (ref 0–40)
Albumin/Globulin Ratio: 2 (ref 1.2–2.2)
Albumin: 4.4 g/dL (ref 3.8–4.8)
Alkaline Phosphatase: 101 IU/L (ref 39–117)
BUN/Creatinine Ratio: 10 — ABNORMAL LOW (ref 12–28)
BUN: 9 mg/dL (ref 8–27)
Bilirubin Total: 0.6 mg/dL (ref 0.0–1.2)
CO2: 24 mmol/L (ref 20–29)
Calcium: 9.9 mg/dL (ref 8.7–10.3)
Chloride: 104 mmol/L (ref 96–106)
Creatinine, Ser: 0.92 mg/dL (ref 0.57–1.00)
GFR calc Af Amer: 77 mL/min/{1.73_m2} (ref >59)
GFR calc non Af Amer: 67 mL/min/{1.73_m2} (ref >59)
Globulin, Total: 2.2 g/dL (ref 1.5–4.5)
Glucose: 160 mg/dL — ABNORMAL HIGH (ref 65–99)
Potassium: 3.8 mmol/L (ref 3.5–5.2)
Sodium: 144 mmol/L (ref 134–144)
Total Protein: 6.6 g/dL (ref 6.0–8.5)

## 2019-09-17 LAB — LIPID PANEL
Chol/HDL Ratio: 3.5 ratio (ref 0.0–4.4)
Cholesterol, Total: 174 mg/dL (ref 100–199)
HDL: 50 mg/dL (ref >39)
LDL Chol Calc (NIH): 101 mg/dL — ABNORMAL HIGH (ref 0–99)
Triglycerides: 132 mg/dL (ref 0–149)
VLDL Cholesterol Cal: 23 mg/dL (ref 5–40)

## 2019-09-17 LAB — HEMOGLOBIN A1C
Est. average glucose Bld gHb Est-mCnc: 220 mg/dL
Hgb A1c MFr Bld: 9.3 % — ABNORMAL HIGH (ref 4.8–5.6)

## 2019-09-17 LAB — CARDIOVASCULAR RISK ASSESSMENT

## 2019-09-17 NOTE — Progress Notes (Signed)
CBC norma, Glucose 160, kidney tests normal, liver tests normal, LDL 101, A1c 9.3 very high, need to increase to tuojeo to 25 units bid. lp

## 2019-09-19 ENCOUNTER — Encounter: Payer: Self-pay | Admitting: Legal Medicine

## 2019-09-29 ENCOUNTER — Other Ambulatory Visit: Payer: Self-pay

## 2019-09-29 MED ORDER — CONTOUR NEXT TEST VI STRP: 1.0000 | ORAL_STRIP | Freq: Two times a day (BID) | 2 refills | Status: DC

## 2019-12-21 DIAGNOSIS — G243 Spasmodic torticollis: Secondary | ICD-10-CM | POA: Diagnosis not present

## 2020-01-15 ENCOUNTER — Encounter: Payer: Self-pay | Admitting: Legal Medicine

## 2020-01-15 ENCOUNTER — Other Ambulatory Visit: Payer: Self-pay

## 2020-01-15 ENCOUNTER — Ambulatory Visit (INDEPENDENT_AMBULATORY_CARE_PROVIDER_SITE_OTHER): Payer: Medicare Other | Admitting: Legal Medicine

## 2020-01-15 ENCOUNTER — Other Ambulatory Visit: Payer: Self-pay | Admitting: Legal Medicine

## 2020-01-15 VITALS — BP 126/60 | HR 81 | Temp 97.5°F | Resp 17 | Ht 66.5 in | Wt 200.4 lb

## 2020-01-15 DIAGNOSIS — E559 Vitamin D deficiency, unspecified: Secondary | ICD-10-CM | POA: Diagnosis not present

## 2020-01-15 DIAGNOSIS — Z0000 Encounter for general adult medical examination without abnormal findings: Secondary | ICD-10-CM

## 2020-01-15 DIAGNOSIS — Z6831 Body mass index (BMI) 31.0-31.9, adult: Secondary | ICD-10-CM | POA: Insufficient documentation

## 2020-01-15 DIAGNOSIS — H40059 Ocular hypertension, unspecified eye: Secondary | ICD-10-CM | POA: Insufficient documentation

## 2020-01-15 DIAGNOSIS — E1169 Type 2 diabetes mellitus with other specified complication: Secondary | ICD-10-CM | POA: Diagnosis not present

## 2020-01-15 DIAGNOSIS — N3001 Acute cystitis with hematuria: Secondary | ICD-10-CM | POA: Diagnosis not present

## 2020-01-15 DIAGNOSIS — Z794 Long term (current) use of insulin: Secondary | ICD-10-CM | POA: Diagnosis not present

## 2020-01-15 DIAGNOSIS — I10 Essential (primary) hypertension: Secondary | ICD-10-CM | POA: Diagnosis not present

## 2020-01-15 DIAGNOSIS — Z124 Encounter for screening for malignant neoplasm of cervix: Secondary | ICD-10-CM | POA: Diagnosis not present

## 2020-01-15 LAB — POCT UA - MICROALBUMIN: Microalbumin Ur, POC: 80 mg/L

## 2020-01-15 LAB — POCT URINALYSIS DIP (CLINITEK)
Bilirubin, UA: NEGATIVE
Glucose, UA: NEGATIVE mg/dL
Ketones, POC UA: NEGATIVE mg/dL
Nitrite, UA: POSITIVE — AB
Spec Grav, UA: 1.025 (ref 1.010–1.025)
Urobilinogen, UA: 0.2 E.U./dL
pH, UA: 6 (ref 5.0–8.0)

## 2020-01-15 MED ORDER — CIPROFLOXACIN HCL 500 MG PO TABS: 500.0000 mg | ORAL_TABLET | Freq: Two times a day (BID) | ORAL | 0 refills | Status: DC

## 2020-01-15 MED ORDER — FLUCONAZOLE 150 MG PO TABS: 150.0000 mg | ORAL_TABLET | Freq: Once | ORAL | 0 refills | Status: AC

## 2020-01-15 NOTE — Progress Notes (Signed)
Subjective:  Patient ID: Suzanne Hardin, female    DOB: November 30, 1955  Age: 64 y.o. MRN: [id number]  Chief Complaint  Patient presents with  . Urinary Tract Infection    Patient has symptoms like UTI  . Annual Exam    HPI  Well Adult Physical: Patient here for a comprehensive physical exam.The patient reports no problems Do you take any herbs or supplements that were not prescribed by a doctor? yes Are you taking calcium supplements? yes Are you taking aspirin daily? no  Encounter for general adult medical examination without abnormal findings  Physical ("At Risk" items are starred): Patient's last physical exam was 1 year ago .  Weight: Appropriate for height BMI 31 Blood Pressure: Normal (BP less than 120/80) ;  Medical History: Patient history reviewed ; Family history reviewed ;  Allergies Reviewed: No change in current allergies ;  Medications Reviewed: Medications reviewed - no changes ;  Lipids: Normal lipid levels ;  Smoking: Life-long non-smoker ;  Physical Activity: Exercises at least 3 times per week ;  Alcohol/Drug Use: Is a non-drinker ; No illicit drug use ;  Patient is not afflicted from Stress Incontinence and Urge Incontinence  Safety: reviewed ; Patient wears a seat belt, has smoke detectors, has carbon monoxide detectors, practices appropriate gun safety, and wears sunscreen with extended sun exposure. Dental Care: biannual cleanings, brushes and flosses daily. Ophthalmology/Optometry: Annual visit.  Hearing loss: none Vision impairments: none Last PSA:    Office Visit from 01/15/2020 in [facility]  PHQ-2 Total Score 0              Social History   Socioeconomic History  . Marital status: Divorced    Spouse name: Not on file  . Number of children: Not on file  . Years of education: Not on file  . Highest education level: Not on file  Occupational History  . Not on file  Tobacco Use  . Smoking status: Never Smoker  . Smokeless tobacco:  Never Used  Substance and Sexual Activity  . Alcohol use: Never  . Drug use: Never  . Sexual activity: Not on file  Other Topics Concern  . Not on file  Social History Narrative  . Not on file   Social Determinants of Health   Financial Resource Strain:   . Difficulty of Paying Living Expenses:   Food Insecurity:   . Worried About Programme researcher, broadcasting/film/video in the Last Year:   . Barista in the Last Year:   Transportation Needs:   . Freight forwarder (Medical):   Marland Kitchen Lack of Transportation (Non-Medical):   Physical Activity:   . Days of Exercise per Week:   . Minutes of Exercise per Session:   Stress:   . Feeling of Stress :   Social Connections:   . Frequency of Communication with Friends and Family:   . Frequency of Social Gatherings with Friends and Family:   . Attends Religious Services:   . Active Member of Clubs or Organizations:   . Attends Banker Meetings:   Marland Kitchen Marital Status:    Past Medical History:  Diagnosis Date  . 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 Cystitis   . Diabetes mellitus without complication ([facility])   . Essential hypertension 09/16/2019  . Hypertension   . Moderate persistent asthma 09/16/2019  . Sciatica of right side   . Type 2 diabetes mellitus with other specified complication ([facility]) 09/16/2019   Past Surgical  History:  Procedure Laterality Date  . APPENDECTOMY  2000  . CATARACT EXTRACTION, BILATERAL    . CHOLECYSTECTOMY  2000  . TONSILLECTOMY AND ADENOIDECTOMY      History reviewed. No pertinent family history. Social History   Socioeconomic History  . Marital status: Divorced    Spouse name: Not on file  . Number of children: Not on file  . Years of education: Not on file  . Highest education level: Not on file  Occupational History  . Not on file  Tobacco Use  . Smoking status: Never Smoker  . Smokeless tobacco: Never Used  Substance and Sexual Activity  . Alcohol use: Never  . Drug use: Never  . Sexual activity:  Not on file  Other Topics Concern  . Not on file  Social History Narrative  . Not on file   Social Determinants of Health   Financial Resource Strain:   . Difficulty of Paying Living Expenses:   Food Insecurity:   . Worried About Programme researcher, broadcasting/film/video in the Last Year:   . Barista in the Last Year:   Transportation Needs:   . Freight forwarder (Medical):   Marland Kitchen Lack of Transportation (Non-Medical):   Physical Activity:   . Days of Exercise per Week:   . Minutes of Exercise per Session:   Stress:   . Feeling of Stress :   Social Connections:   . Frequency of Communication with Friends and Family:   . Frequency of Social Gatherings with Friends and Family:   . Attends Religious Services:   . Active Member of Clubs or Organizations:   . Attends Banker Meetings:   Marland Kitchen Marital Status:    Review of Systems  Constitutional: Negative.   HENT: Negative.   Eyes: Negative.   Respiratory: Negative.   Cardiovascular: Negative.   Gastrointestinal: Negative.   Genitourinary: Negative.   Musculoskeletal: Negative.   Skin: Negative.   Neurological: Negative.   Psychiatric/Behavioral: Negative.      Objective:  BP (!) 126/60 (BP Location: Right Arm, Patient Position: Sitting)   Pulse 81   Temp (!) 97.5 F (36.4 C) (Temporal)   Resp 17   Ht 5' 6.5" (1.689 m)   Wt 200 lb 6.4 oz (90.9 kg)   SpO2 99%   BMI 31.86 kg/m   BP/Weight 01/15/2020 09/16/2019  Systolic BP 126 128  Diastolic BP 60 72  Wt. (Lbs) 200.4 199.6  BMI 31.86 32.22    Physical Exam Vitals reviewed. Exam conducted with a chaperone present.  Constitutional:      Appearance: Normal appearance.  HENT:     Head: Normocephalic and atraumatic.     Right Ear: Tympanic membrane, ear canal and external ear normal.     Left Ear: Tympanic membrane, ear canal and external ear normal.     Nose: Nose normal.     Mouth/Throat:     Mouth: Mucous membranes are moist.     Pharynx: Oropharyngeal  exudate present.  Eyes:     Extraocular Movements: Extraocular movements intact.     Pupils: Pupils are equal, round, and reactive to light.  Cardiovascular:     Rate and Rhythm: Normal rate and regular rhythm.     Pulses: Normal pulses.     Heart sounds: Normal heart sounds.  Pulmonary:     Effort: Pulmonary effort is normal.     Breath sounds: Normal breath sounds.  Abdominal:     General: Abdomen is flat.  Bowel sounds are normal.     Palpations: Abdomen is soft.  Genitourinary:    Exam position: Lithotomy position.     Pubic Area: No rash.      Tanner stage (genital): 5.     Labia:        Right: No rash, tenderness or lesion.        Left: No rash, tenderness, lesion or injury.      Urethra: No prolapse, urethral pain, urethral swelling or urethral lesion.     Comments: PAP performed, uterus anteverted adnexa nonpalpable Musculoskeletal:        General: Normal range of motion.     Cervical back: Normal range of motion and neck supple.  Lymphadenopathy:     Lower Body: No right inguinal adenopathy. No left inguinal adenopathy.  Skin:    General: Skin is warm and dry.     Capillary Refill: Capillary refill takes less than 2 seconds.  Neurological:     General: No focal deficit present.     Mental Status: She is alert and oriented to person, place, and time. Mental status is at baseline.  Psychiatric:        Mood and Affect: Mood normal.        Behavior: Behavior normal.        Thought Content: Thought content normal.        Judgment: Judgment normal.     Lab Results  Component Value Date   WBC 7.3 01/15/2020   HGB 11.7 01/15/2020   HCT 36.9 01/15/2020   PLT 351 01/15/2020   GLUCOSE 166 (H) 01/15/2020   CHOL 209 (H) 01/15/2020   TRIG 109 01/15/2020   HDL 58 01/15/2020   LDLCALC 132 (H) 01/15/2020   ALT 16 01/15/2020   AST 19 01/15/2020   NA 141 01/15/2020   K 4.2 01/15/2020   CL 103 01/15/2020   CREATININE 0.89 01/15/2020   BUN 13 01/15/2020   CO2 27  01/15/2020   TSH 1.760 01/15/2020   HGBA1C 9.3 (H) 09/16/2019   MICROALBUR 80 01/15/2020      Assessment & Plan:  1. Essential hypertension - CBC with Differential/Platelet - Comprehensive metabolic panel An individual hypertension care plan was established and reinforced today.  The patient's status was assessed using clinical findings on exam and labs or diagnostic tests. The patient's success at meeting treatment goals on disease specific evidence-based guidelines and found to be well controlled. SELF MANAGEMENT: The patient and I together assessed ways to personally work towards obtaining the recommended goals. RECOMMENDATIONS: avoid decongestants found in common cold remedies, decrease consumption of alcohol, perform routine monitoring of BP with home BP cuff, exercise, reduction of dietary salt, take medicines as prescribed, try not to miss doses and quit smoking.  Regular exercise and maintaining a healthy weight is needed.  Stress reduction may help. A CLINICAL SUMMARY including written plan identify barriers to care unique to individual due to social or financial issues.  We attempt to mutually creat solutions for individual and family understanding.  2. Type 2 diabetes mellitus with other specified complication, with long-term current use of insulin (HCC) - POCT UA - Microalbumin An individual care plan for diabetes was established and reinforced today.  The patient's status was assessed using clinical findings on exam, labs and diagnostic testing. Patient success at meeting goals based on disease specific evidence-based guidelines and found to be good controlled. Medications were assessed and patient's understanding of the medical issues , including barriers were assessed.  Recommend adherence to a diabetic diet, a graduated exercise program, HgbA1c level is checked quarterly, and urine microalbumin performed yearly .  Annual mono-filament sensation testing performed. Lower blood  pressure and control hyperlipidemia is important. Get annual eye exams and annual flu shots and smoking cessation discussed.  Self management goals were discussed.  3. Acute cystitis with hematuria - POCT URINALYSIS DIP (CLINITEK) - Urine Culture Treat with cipro with diflucan   4. Annual physical exam - TSH - Lipid Panel AN INDIVIDUAL CARE PLAN for physical was established and reinforced today.  The patient's status was assessed using clinical findings on exam, labs, and other diagnostic testing. Patient's success at meeting treatment goals based on disease specific evidence-bassed guidelines and found to be in good control. RECOMMENDATIONS include maintaining present medicines and treatment. Information of health maintenance was given  5. Screening for cervical cancer - IGP,CtNgTv,Apt HPV,rfx16/18,45 Pelvic normal postmenopausal exam  6. BMI 31.0-31.9,adult An individualize plan was formulated for obesity using patient history and physical exam to encourage weight loss.  An evidence based program was formulated.  Patient is to cut portion size with meals and to plan physical exercise 3 days a week at least 20 minutes.  Weight watchers and other programs are helpful.  Planned amount of weight loss 10 lbs.  7. Vitamin D deficiency - Vitamin D, 25-hydroxy Check vitamin D level    Body mass index is 31.86 kg/m.   These are the goals we discussed: Goals   None    Weight loss  This is a list of the screening recommended for you and due dates:  Health Maintenance  Topic Date Due  .  Hepatitis C: One time screening is recommended by Center for Disease Control  (CDC) for  adults born from [birth year] through 1965.   Never done  . Pneumococcal vaccine  Never done  . Eye exam for diabetics  Never done  . COVID-19 Vaccine (1) Never done  . HIV Screening  Never done  . Tetanus Vaccine  Never done  . Pap Smear  Never done  . Colon Cancer Screening  06/25/2016  . Flu Shot  01/24/2020  .  Hemoglobin A1C  03/18/2020  . Complete foot exam   09/15/2020  . Urine Protein Check  01/14/2021  . Mammogram  06/08/2021     AN INDIVIDUALIZED CARE PLAN: was established or reinforced today.   SELF MANAGEMENT: The patient and I together assessed ways to personally work towards obtaining the recommended goals  Support needs The patient and/or family needs were assessed and services were offered if appropriate.  Meds ordered this encounter  Medications  . fluconazole (DIFLUCAN) 150 MG tablet    Sig: Take 1 tablet (150 mg total) by mouth once for 1 dose.    Dispense:  3 tablet    Refill:  0  . ciprofloxacin (CIPRO) 500 MG tablet    Sig: Take 1 tablet (500 mg total) by mouth 2 (two) times daily.    Dispense:  20 tablet    Refill:  0    Follow-up: Return in about 6 months (around 07/17/2020) for fasting.  An After Visit Summary was printed and given to the patient.  Brent BullaLawrence Vermelle [place] [facility] [phone]

## 2020-01-16 LAB — LIPID PANEL
Chol/HDL Ratio: 3.6 ratio (ref 0.0–4.4)
Cholesterol, Total: 209 mg/dL — ABNORMAL HIGH (ref 100–199)
HDL: 58 mg/dL (ref >39)
LDL Chol Calc (NIH): 132 mg/dL — ABNORMAL HIGH (ref 0–99)
Triglycerides: 109 mg/dL (ref 0–149)
VLDL Cholesterol Cal: 19 mg/dL (ref 5–40)

## 2020-01-16 LAB — CBC WITH DIFFERENTIAL/PLATELET
Basophils Absolute: 0.1 10*3/uL (ref 0.0–0.2)
Basos: 1 %
EOS (ABSOLUTE): 0.2 10*3/uL (ref 0.0–0.4)
Eos: 2 %
Hematocrit: 36.9 % (ref 34.0–46.6)
Hemoglobin: 11.7 g/dL (ref 11.1–15.9)
Immature Grans (Abs): 0 10*3/uL (ref 0.0–0.1)
Immature Granulocytes: 0 %
Lymphocytes Absolute: 2.4 10*3/uL (ref 0.7–3.1)
Lymphs: 33 %
MCH: 25.1 pg — ABNORMAL LOW (ref 26.6–33.0)
MCHC: 31.7 g/dL (ref 31.5–35.7)
MCV: 79 fL (ref 79–97)
Monocytes Absolute: 0.3 10*3/uL (ref 0.1–0.9)
Monocytes: 5 %
Neutrophils Absolute: 4.4 10*3/uL (ref 1.4–7.0)
Neutrophils: 59 %
Platelets: 351 10*3/uL (ref 150–450)
RBC: 4.66 x10E6/uL (ref 3.77–5.28)
RDW: 14.8 % (ref 11.7–15.4)
WBC: 7.3 10*3/uL (ref 3.4–10.8)

## 2020-01-16 LAB — COMPREHENSIVE METABOLIC PANEL
ALT: 16 IU/L (ref 0–32)
AST: 19 IU/L (ref 0–40)
Albumin/Globulin Ratio: 2.1 (ref 1.2–2.2)
Albumin: 4.5 g/dL (ref 3.8–4.8)
Alkaline Phosphatase: 101 IU/L (ref 48–121)
BUN/Creatinine Ratio: 15 (ref 12–28)
BUN: 13 mg/dL (ref 8–27)
Bilirubin Total: 0.4 mg/dL (ref 0.0–1.2)
CO2: 27 mmol/L (ref 20–29)
Calcium: 9.5 mg/dL (ref 8.7–10.3)
Chloride: 103 mmol/L (ref 96–106)
Creatinine, Ser: 0.89 mg/dL (ref 0.57–1.00)
GFR calc Af Amer: 80 mL/min/{1.73_m2} (ref >59)
GFR calc non Af Amer: 70 mL/min/{1.73_m2} (ref >59)
Globulin, Total: 2.1 g/dL (ref 1.5–4.5)
Glucose: 166 mg/dL — ABNORMAL HIGH (ref 65–99)
Potassium: 4.2 mmol/L (ref 3.5–5.2)
Sodium: 141 mmol/L (ref 134–144)
Total Protein: 6.6 g/dL (ref 6.0–8.5)

## 2020-01-16 LAB — TSH: TSH: 1.76 u[IU]/mL (ref 0.450–4.500)

## 2020-01-16 LAB — VITAMIN D 25 HYDROXY (VIT D DEFICIENCY, FRACTURES): Vit D, 25-Hydroxy: 28.6 ng/mL — ABNORMAL LOW (ref 30.0–100.0)

## 2020-01-16 LAB — CARDIOVASCULAR RISK ASSESSMENT

## 2020-01-16 NOTE — Progress Notes (Signed)
Vitamin d low, CBC normal, glucose 166, kidneys and liver tests normal. TSH 1.76 normal, Choesterol LDL 132 high. Take vitamin D 2000 iu qd day lp

## 2020-01-18 ENCOUNTER — Other Ambulatory Visit: Payer: Self-pay | Admitting: Legal Medicine

## 2020-01-18 LAB — URINE CULTURE

## 2020-01-18 NOTE — Progress Notes (Signed)
Urine culture KLebsiella pneumonae sensitive to cipro lp

## 2020-01-19 LAB — IGP,CTNGTV,APT HPV,RFX16/18,45
Chlamydia, Nuc. Acid Amp: NEGATIVE
Gonococcus, Nuc. Acid Amp: NEGATIVE
HPV Aptima: NEGATIVE
PAP Smear Comment: 0
Trich vag by NAA: NEGATIVE

## 2020-01-20 ENCOUNTER — Other Ambulatory Visit: Payer: Self-pay

## 2020-01-20 DIAGNOSIS — E1169 Type 2 diabetes mellitus with other specified complication: Secondary | ICD-10-CM

## 2020-01-20 DIAGNOSIS — Z794 Long term (current) use of insulin: Secondary | ICD-10-CM

## 2020-01-20 MED ORDER — TOUJEO SOLOSTAR 300 UNIT/ML ~~LOC~~ SOPN: 23.0000 [IU] | PEN_INJECTOR | Freq: Every day | SUBCUTANEOUS | 6 refills | Status: AC

## 2020-01-21 NOTE — Progress Notes (Signed)
PAP normal lp

## 2020-02-02 DIAGNOSIS — L720 Epidermal cyst: Secondary | ICD-10-CM | POA: Diagnosis not present

## 2020-02-02 DIAGNOSIS — D485 Neoplasm of uncertain behavior of skin: Secondary | ICD-10-CM | POA: Diagnosis not present

## 2020-02-24 ENCOUNTER — Telehealth: Payer: Self-pay

## 2020-02-24 NOTE — Telephone Encounter (Signed)
Applying for Approval for tier adjustment (reduction) on copay for TETRACYCLINE as it is the only one brand pt says she can use due to it is free of the binder that she is allergic to.   CIPRO has been resistant on e coli.  Pt states [employer] will be calling Dr. Sedalia Muta concerning this. Trying to get approval so that it will already be approved for future prescriptions of Tetracycline and can take Cipro when it is appropriate as per the diagnosis. Pt calling to give heads up.

## 2020-02-24 NOTE — Telephone Encounter (Signed)
Can you help this patient? lp

## 2020-02-25 NOTE — Telephone Encounter (Signed)
Forwarded patient's information to scheduling to verify benefits and reach out.

## 2020-03-16 ENCOUNTER — Other Ambulatory Visit: Payer: Self-pay

## 2020-03-16 DIAGNOSIS — I10 Essential (primary) hypertension: Secondary | ICD-10-CM

## 2020-03-16 DIAGNOSIS — Z794 Long term (current) use of insulin: Secondary | ICD-10-CM

## 2020-03-16 DIAGNOSIS — E1169 Type 2 diabetes mellitus with other specified complication: Secondary | ICD-10-CM

## 2020-03-17 ENCOUNTER — Telehealth: Payer: Self-pay | Admitting: Legal Medicine

## 2020-03-17 ENCOUNTER — Other Ambulatory Visit: Payer: Self-pay

## 2020-03-17 ENCOUNTER — Other Ambulatory Visit: Payer: Self-pay | Admitting: Legal Medicine

## 2020-03-17 NOTE — Chronic Care Management (AMB) (Signed)
  Chronic Care Management   Note  03/17/2020 Name: Suzanne Hardin MRN: [id number] DOB: January 08, 1956  Suzanne Hardin is a 64 y.o. year old female who is a primary care patient of Abigail Miyamoto, MD. I reached out to [name] by phone today in response to a referral sent by Ms. Dwyane Luo Frerking's PCP, Abigail Miyamoto, MD.   [name] was given information about Chronic Care Management services today including:  1. CCM service includes personalized support from designated clinical staff supervised by her physician, including individualized plan of care and coordination with other care providers 2. 24/7 contact phone numbers for assistance for urgent and routine care needs. 3. Service will only be billed when office clinical staff spend 20 minutes or more in a month to coordinate care. 4. Only one practitioner may furnish and bill the service in a calendar month. 5. The patient may stop CCM services at any time (effective at the end of the month) by phone call to the office staff.   Patient agreed to services and verbal consent obtained.   Follow up plan:   Aggie Hacker  Upstream Scheduler

## 2020-03-28 DIAGNOSIS — Z888 Allergy status to other drugs, medicaments and biological substances status: Secondary | ICD-10-CM | POA: Diagnosis not present

## 2020-03-28 DIAGNOSIS — Z884 Allergy status to anesthetic agent status: Secondary | ICD-10-CM | POA: Diagnosis not present

## 2020-03-28 DIAGNOSIS — G243 Spasmodic torticollis: Secondary | ICD-10-CM | POA: Diagnosis not present

## 2020-03-28 DIAGNOSIS — Z91010 Allergy to peanuts: Secondary | ICD-10-CM | POA: Diagnosis not present

## 2020-03-28 DIAGNOSIS — Z9102 Food additives allergy status: Secondary | ICD-10-CM | POA: Diagnosis not present

## 2020-03-28 DIAGNOSIS — K219 Gastro-esophageal reflux disease without esophagitis: Secondary | ICD-10-CM | POA: Diagnosis not present

## 2020-03-28 DIAGNOSIS — Z882 Allergy status to sulfonamides status: Secondary | ICD-10-CM | POA: Diagnosis not present

## 2020-03-28 DIAGNOSIS — Z880 Allergy status to penicillin: Secondary | ICD-10-CM | POA: Diagnosis not present

## 2020-03-28 DIAGNOSIS — E119 Type 2 diabetes mellitus without complications: Secondary | ICD-10-CM | POA: Diagnosis not present

## 2020-03-28 DIAGNOSIS — Z885 Allergy status to narcotic agent status: Secondary | ICD-10-CM | POA: Diagnosis not present

## 2020-04-04 NOTE — Chronic Care Management (AMB) (Signed)
Chronic Care Management Pharmacy  Name: Suzanne Hardin  MRN: [id number] DOB: 1955/07/25  Chief Complaint/ HPI  Suzanne Hardin,  64 y.o. , female presents for their Initial CCM visit with the clinical pharmacist via telephone due to COVID-19 Pandemic.  PCP : Abigail Miyamoto, MD  Their chronic conditions include: hypertension, carotid artery occlusion, moderate persistent asthma, type 2 diabetes, neuropathy, vitamin D deficiency.   Office Visits: 01/15/2020 -UTI: Urine culture klebsiella pneumoniae sensitive to cipro. Vitamin d low, CBC normal, glucose 166, kidneys and liver tests normal. TSH 1.76 normal, Choesterol LDL 132 high. Take vitamin D 2000 iu qd day   Consult Visit: 03/28/2020 - Neurology - botox injection for cervical dystonia. 12/21/2019 - Neurology - botox injection for cervical dystonia.  Medications: Outpatient Encounter Medications as of 04/06/2020  Medication Sig  . albuterol (VENTOLIN HFA) 108 (90 Base) MCG/ACT inhaler Inhale 2 puffs into the lungs every 6 (six) hours as needed.   . Alpha-Lipoic Acid 300 MG CAPS Take 300 mg by mouth daily.  . baclofen (LIORESAL) 10 MG tablet Take 10 mg by mouth 3 (three) times daily.  . Cholecalciferol (VITAMIN D) 50 MCG (2000 UT) tablet Take 2,000 Units by mouth daily.  . Cyanocobalamin 1000 MCG TBCR Take 1 tablet by mouth daily.  Marland Kitchen glucose blood (CONTOUR NEXT TEST) test strip 1 each by Other route 2 (two) times daily. Use as instructed  . hydrOXYzine (ATARAX) 10 MG/5ML syrup Take by mouth.  Marland Kitchen ipratropium-albuterol (DUONEB) 0.5-2.5 (3) MG/3ML SOLN Inhale 3 mLs into the lungs every 6 (six) hours as needed.   . Lancets MISC by Does not apply route.  Marland Kitchen NOVOLOG FLEXPEN 100 UNIT/ML FlexPen Inject into the skin daily. Patient uses slidding scale. Maximum 30 units uses only blood sugar >250mg /dl.  Using 8-10 units when sugars are high lately.  . pyridOXINE (VITAMIN B-6) 50 MG tablet Take 25 mg by mouth daily. Takes the p5pb6  version 25 mg daily  . Sodium Chloride-Sodium Bicarb (NETI POT SINUS WASH NA) Place into the nose as needed.  [name] SOLOSTAR 300 UNIT/ML Solostar Pen Inject 23 Units into the skin daily. (Patient taking differently: Inject 25 Units into the skin daily. )  . TRIAMCINOLONE ACETONIDE, TOP, 0.05 % OINT Apply topically daily as needed. Uses as needed for rash, bug bites or on gums before dentist.  . zinc gluconate 50 MG tablet Take 50 mg by mouth daily. Takes Monday-Friday.  . ciprofloxacin (CIPRO) 500 MG tablet Take 1 tablet (500 mg total) by mouth 2 (two) times daily. (Patient not taking: Reported on 04/06/2020)  . glucose blood test strip 1 each by Other route 3 (three) times daily as needed. Use as instructed (Patient not taking: Reported on 04/06/2020)  . hydrOXYzine (VISTARIL) 50 MG capsule Take by mouth. (Patient not taking: Reported on 04/06/2020)   No facility-administered encounter medications on file as of 04/06/2020.   Allergies  Allergen Reactions  . Dextromethorphan-Guaifenesin Itching  . Hydrocodone Other (See Comments)    Inflammatory Inflammatory Other reaction(s): Other (See Comments) Inflammatory Inflammatory   . Lidocaine Swelling  . Other Rash, Other (See Comments) and Swelling    Neurological damage fragances fragances  Neurological damage Fragances; YELLOW NUMBER 5;  Yellow #5 Yellow #5 Yellow #5 Yellow #5  High eye pressure High eye pressure High eye pressure   . Sodium Laureth Sulfate Dermatitis, Hives, Itching, Other (See Comments), Rash and Swelling  . Sulfa Antibiotics Hives  . Sulfites Shortness Of Breath  Other reaction(s): Shortness of Breath Other reaction(s): Shortness of Breath   . Red Dye   . Articaine-Epinephrine Swelling  . Cymbalta [Duloxetine Hcl]   . Darvon [Propoxyphene]   . Duloxetine   . Gentamicin Sulfate   . Hctz [Hydrochlorothiazide]   . Humalog [Insulin Lispro]   . Keflex [Cephalexin]   . Lasix [Furosemide]   .  Lisinopril-Hydrochlorothiazide   . Loratadine   . Macrodantin [Nitrofurantoin]   . Metformin   . Mucinex [Guaifenesin Er]   . Peanut-Containing Drug Products   . Penicillins   . Pioglitazone   . Tessalon Perles [Benzonatate]   . Travatan [Travoprost]   . Tylenol [Acetaminophen]   . Propofol Other (See Comments) and Nausea And Vomiting  . Sulfamethoxazole-Trimethoprim Rash   SDOH Screenings   Alcohol Screen:   . Last Alcohol Screening Score (AUDIT): Not on file  Depression (PHQ2-9): Low Risk   . PHQ-2 Score: 0  Financial Resource Strain:   . Difficulty of Paying Living Expenses: Not on file  Food Insecurity:   . Worried About Programme researcher, broadcasting/film/videounning Out of Food in the Last Year: Not on file  . Ran Out of Food in the Last Year: Not on file  Housing:   . Last Housing Risk Score: Not on file  Physical Activity:   . Days of Exercise per Week: Not on file  . Minutes of Exercise per Session: Not on file  Social Connections:   . Frequency of Communication with Friends and Family: Not on file  . Frequency of Social Gatherings with Friends and Family: Not on file  . Attends Religious Services: Not on file  . Active Member of Clubs or Organizations: Not on file  . Attends BankerClub or Organization Meetings: Not on file  . Marital Status: Not on file  Stress:   . Feeling of Stress : Not on file  Tobacco Use: Low Risk   . Smoking Tobacco Use: Never Smoker  . Smokeless Tobacco Use: Never Used  Transportation Needs:   . Freight forwarderLack of Transportation (Medical): Not on file  . Lack of Transportation (Non-Medical): Not on file    Current Diagnosis/Assessment:  Goals Addressed            This Visit's Progress   . Pharmacy Care Plan       CARE PLAN ENTRY (see longitudinal plan of care for additional care plan information)  Current Barriers:  . Chronic Disease Management support, education, and care coordination needs related to Hypertension and Diabetes   Hypertension BP Readings from Last 3  Encounters:  01/15/20 (!) 126/60  09/16/19 128/72   . Pharmacist Clinical Goal(s): o Over the next 90 days, patient will work with PharmD and providers to maintain BP goal <130/80 . Current regimen:  o Diet and Exercise . Interventions: o Keep up the good work with returning to exercise.  o Discussed portion control and weight loss benefites.  . Patient self care activities - Over the next 90 days, patient will: o Ensure daily salt intake < 2300 mg/day  Diabetes Lab Results  Component Value Date/Time   HGBA1C 9.3 (H) 09/16/2019 08:24 AM   . Pharmacist Clinical Goal(s): o Over the next 90 days, patient will work with PharmD and providers to achieve A1c goal <7% . Current regimen:  o Novolog 8-10 units sliding scale as needed o Toujeo 300 units/ml 25 units daily . Interventions: o Discussed option of Trulicity. Will coordinate with Dr. Marina GoodellPerry to see if appropriate.  o Reviewed inactive ingredients of  Trulicity to determine tolerability.  . Patient self care activities - Over the next 90 days, patient will: o Check blood sugar 3-4 times daily, document, and provide at future appointments o Contact provider with any episodes of hypoglycemia  Medication management . Pharmacist Clinical Goal(s): o Over the next 90 days, patient will work with PharmD and providers to maintain optimal medication adherence . Current pharmacy: [facility] . Interventions o Comprehensive medication review performed. o Continue current medication management strategy . Patient self care activities - Over the next 90 days, patient will: o Focus on medication adherence by pill box o Take medications as prescribed o Report any questions or concerns to PharmD and/or provider(s)  Initial goal documentation         Asthma     Eosinophil count:  No results found for: EOSPCT%                               Eos (Absolute):  Lab Results  Component Value Date/Time   EOSABS 0.2 01/15/2020 09:07 AM     Tobacco Status:  Social History   Tobacco Use  Smoking Status Never Smoker  Smokeless Tobacco Never Used    Patient has failed these meds in past: none reported  Patient is currently controlled on the following medications:   Albuterol hfa inhaler prn  Duoneb 0.5-2.5 mg/78ml prn Using maintenance inhaler regularly? No Frequency of rescue inhaler use:  infrequently   We discussed:  proper inhaler technique. Asthma improved 10 months after last vaccine. Rarely uses inhaler or nebulizer. If she gets a cold or infection, she has to use inhalers then only.   Plan  Continue current medications   Diabetes   Recent Relevant Labs: Lab Results  Component Value Date/Time   HGBA1C 9.3 (H) 09/16/2019 08:24 AM   MICROALBUR 80 01/15/2020 08:28 AM    Checking BG: Four times daily  Recent FBG Readings: 125-225 mg/dL Recent pre-meal BG readings: 98, 103 Recent 2hr PP BG readings:  129, 140, 160  Patient has failed these meds in past: metformin Patient is currently uncontrolled on the following medications:   Novolog - up to 30 units sliding scale  Tojeo Solostar 300 units/ml 25 units into the skin daily  Blood sugar testing strip and lencets tid prn  Last diabetic Foot exam: No results found for: HMDIABEYEEXA  Last diabetic Eye exam: No results found for: HMDIABFOOTEX   We discussed: diet and exercise extensively.   Reviewed ingredient in Trulicity or GLP-1.  Patient is interested in a trial of Trulicity if Dr. Marina Goodell approves. She does not like the way she feels rapid acting insulin.   Using generic Novolog from [facility] for cost savings right now. She couldn't afford her rapid insulin for a while and her A1C is less. Patient does not feel well with blood sugars in the 80s.   Diet: working to get back to smaller portions. Hopes to get weight back to 160 lbs.   Exercise: Patient enjoys the pool for exercise daily. COVID has made this trickier over the past 18 months.. She  gained 20 lbs over the past year. Monday through Friday she is in the pool for >1 hour again now. Pool exercise is the only form of exercise she tolerates.   Plan  Continue current medications and control with diet and exercise  and  Hypertension   BP today is:  <130/80  Office blood pressures are  BP Readings  from Last 3 Encounters:  01/15/20 (!) 126/60  09/16/19 128/72    Patient has failed these meds in the past: lisinopril, valsartan, losartan Patient is currently controlled on the following medications:  Diet and Exercise  Patient home BP readings are ranging: well controlled.   We discussed diet and exercise extensively  Plan  Continue control with diet and exercise     Health Maintenance   Patient is currently controlled on the following medications:  . hydroxyzine 10 mg/5 ml syrup - uses for food allergies if benadryl . Vitamin B12 1000 mcg daily  . Baclofen 10 mg tid prn - muscle pain and spasm before baclofen . Vitamin D 2000 units daily . P5P Vitamin B6 - supplementation . Alpha lipoic acid 300 mg daily - supplementation  We discussed:   Patient has to be very careful with medications and diet due to multiple allergies. Sulfites in medications cause a reaction. Lanolin also created a problem in many supplements. She can no longer find an Epi-pen on the market without sulfite. She has to be careful with preservatives in foods. She can't tolerate sulfates.   Plan  Continue current medications  Vaccines   Reviewed and discussed patient's vaccination history.    Patient cannot take a flu shot due to allergic reaction. 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 She cannot take any vaccines due to this.   Immunization History  Administered Date(s) Administered  . Influenza-Unspecified 04/09/2013    Plan  Patient cannot tolerate any vaccines due to severe reaction to flu shot in the past.   Medication Management   Pt uses [facility] pharmacy for  all medications Uses pill box? Yes Pt endorses good compliance  We discussed: Current pharmacy is preferred with insurance plan and patient is satisfied with pharmacy services  Plan  Continue current medication management strategy    Follow up: 1 month phone visit

## 2020-04-06 ENCOUNTER — Ambulatory Visit: Payer: Medicare Other

## 2020-04-06 ENCOUNTER — Other Ambulatory Visit: Payer: Self-pay | Admitting: Legal Medicine

## 2020-04-06 ENCOUNTER — Other Ambulatory Visit: Payer: Self-pay

## 2020-04-06 DIAGNOSIS — E1169 Type 2 diabetes mellitus with other specified complication: Secondary | ICD-10-CM

## 2020-04-06 MED ORDER — TRULICITY 0.75 MG/0.5ML ~~LOC~~ SOAJ: 0.7500 mg | SUBCUTANEOUS | 6 refills | Status: DC

## 2020-04-06 NOTE — Progress Notes (Signed)
Trulicity is not covered but ozempic, victoza etc is tear one.  I called in trulity though lp

## 2020-04-06 NOTE — Patient Instructions (Addendum)
Visit Information  Thank you for your time discussing your medications. I look forward to working with you to achieve your health care goals. Below is a summary of what we talked about during our visit.   Goals Addressed            This Visit's Progress   . Pharmacy Care Plan       CARE PLAN ENTRY (see longitudinal plan of care for additional care plan information)  Current Barriers:  . Chronic Disease Management support, education, and care coordination needs related to Hypertension and Diabetes   Hypertension BP Readings from Last 3 Encounters:  01/15/20 (!) 126/60  09/16/19 128/72   . Pharmacist Clinical Goal(s): o Over the next 90 days, patient will work with PharmD and providers to maintain BP goal <130/80 . Current regimen:  o Diet and Exercise . Interventions: o Keep up the good work with returning to exercise.  o Discussed portion control and weight loss benefites.  . Patient self care activities - Over the next 90 days, patient will: o Ensure daily salt intake < 2300 mg/day  Diabetes Lab Results  Component Value Date/Time   HGBA1C 9.3 (H) 09/16/2019 08:24 AM   . Pharmacist Clinical Goal(s): o Over the next 90 days, patient will work with PharmD and providers to achieve A1c goal <7% . Current regimen:  o Novolog 8-10 units sliding scale as needed o Toujeo 300 units/ml 25 units daily . Interventions: o Discussed option of Trulicity. Will coordinate with Dr. Marina Goodell to see if appropriate.  o Reviewed inactive ingredients of Trulicity to determine tolerability.  . Patient self care activities - Over the next 90 days, patient will: o Check blood sugar 3-4 times daily, document, and provide at future appointments o Contact provider with any episodes of hypoglycemia  Medication management . Pharmacist Clinical Goal(s): o Over the next 90 days, patient will work with PharmD and providers to maintain optimal medication adherence . Current pharmacy:  [facility] . Interventions o Comprehensive medication review performed. o Continue current medication management strategy . Patient self care activities - Over the next 90 days, patient will: o Focus on medication adherence by pill box o Take medications as prescribed o Report any questions or concerns to PharmD and/or provider(s)  Initial goal documentation        Ms. Basinski was given information about Chronic Care Management services today including:  1. CCM service includes personalized support from designated clinical staff supervised by her physician, including individualized plan of care and coordination with other care providers 2. 24/7 contact phone numbers for assistance for urgent and routine care needs. 3. Standard insurance, coinsurance, copays and deductibles apply for chronic care management only during months in which we provide at least 20 minutes of these services. Most insurances cover these services at 100%, however patients may be responsible for any copay, coinsurance and/or deductible if applicable. This service may help you avoid the need for more expensive face-to-face services. 4. Only one practitioner may furnish and bill the service in a calendar month. 5. The patient may stop CCM services at any time (effective at the end of the month) by phone call to the office staff.  Patient agreed to services and verbal consent obtained.   Print copy of patient instructions provided.  Telephone follow up appointment with pharmacy team member scheduled for:04/2020  Juliane Lack, PharmD Clinical Pharmacist [facility] [phone] (office) [phone] (mobile) Dulaglutide injection What is this medicine? DULAGLUTIDE (DOO la GLOO tide)  is used to improve blood sugar control in adults with type 2 diabetes. This medicine may be used with other oral diabetes medicines. This drug may also reduce the risk of heart attack or stroke if you have type 2 diabetes and  risk factors for heart disease. This medicine may be used for other purposes; ask your health care provider or pharmacist if you have questions. COMMON BRAND NAME(S): Trulicity What should I tell my health care provider before I take this medicine? They need to know if you have any of these conditions:  endocrine tumors (MEN 2) or if someone in your family had these tumors  eye disease, vision problems  history of pancreatitis  kidney disease  liver disease  stomach problems  thyroid cancer or if someone in your family had thyroid cancer  an unusual or allergic reaction to dulaglutide, other medicines, foods, dyes, or preservatives  pregnant or trying to get pregnant  breast-feeding How should I use this medicine? This medicine is for injection under the skin of your upper leg (thigh), stomach area, or upper arm. It is usually given once every week (every 7 days). You will be taught how to prepare and give this medicine. Use exactly as directed. Take your medicine at regular intervals. Do not take it more often than directed. If you use this medicine with insulin, you should inject this medicine and the insulin separately. Do not mix them together. Do not give the injections right next to each other. Change (rotate) injection sites with each injection. It is important that you put your used needles and syringes in a special sharps container. Do not put them in a trash can. If you do not have a sharps container, call your pharmacist or healthcare provider to get one. A special MedGuide will be given to you by the pharmacist with each prescription and refill. Be sure to read this information carefully each time. This drug comes with INSTRUCTIONS FOR USE. Ask your pharmacist for directions on how to use this drug. Read the information carefully. Talk to your pharmacist or health care provider if you have questions. Talk to your pediatrician regarding the use of this medicine in children.  Special care may be needed. Overdosage: If you think you have taken too much of this medicine contact a poison control center or emergency room at once. NOTE: This medicine is only for you. Do not share this medicine with others. What if I miss a dose? If you miss a dose, take it as soon as you can within 3 days after the missed dose. Then take your next dose at your regular weekly time. If it has been longer than 3 days after the missed dose, do not take the missed dose. Take the next dose at your regular time. Do not take double or extra doses. If you have questions about a missed dose, contact your health care provider for advice. What may interact with this medicine?  other medicines for diabetes Many medications may cause changes in blood sugar, these include:  alcohol containing beverages  antiviral medicines for HIV or AIDS  aspirin and aspirin-like drugs  certain medicines for blood pressure, heart disease, irregular heart beat  chromium  diuretics  female hormones, such as estrogens or progestins, birth control pills  fenofibrate  gemfibrozil  isoniazid  lanreotide  female hormones or anabolic steroids  MAOIs like Carbex, Eldepryl, Marplan, Nardil, and Parnate  medicines for weight loss  medicines for allergies, asthma, cold, or cough  medicines for depression, anxiety, or psychotic disturbances  niacin  nicotine  NSAIDs, medicines for pain and inflammation, like ibuprofen or naproxen  octreotide  pasireotide  pentamidine  phenytoin  probenecid  quinolone antibiotics such as ciprofloxacin, levofloxacin, ofloxacin  some herbal dietary supplements  steroid medicines such as prednisone or cortisone  sulfamethoxazole; trimethoprim  thyroid hormones Some medications can hide the warning symptoms of low blood sugar (hypoglycemia). You may need to monitor your blood sugar more closely if you are taking one of these medications. These  include:  beta-blockers, often used for high blood pressure or heart problems (examples include atenolol, metoprolol, propranolol)  clonidine  guanethidine  reserpine This list may not describe all possible interactions. Give your health care provider a list of all the medicines, herbs, non-prescription drugs, or dietary supplements you use. Also tell them if you smoke, drink alcohol, or use illegal drugs. Some items may interact with your medicine. What should I watch for while using this medicine? Visit your doctor or health care professional for regular checks on your progress. Drink plenty of fluids while taking this medicine. Check with your doctor or health care professional if you get an attack of severe diarrhea, nausea, and vomiting. The loss of too much body fluid can make it dangerous for you to take this medicine. A test called the HbA1C (A1C) will be monitored. This is a simple blood test. It measures your blood sugar control over the last 2 to 3 months. You will receive this test every 3 to 6 months. Learn how to check your blood sugar. Learn the symptoms of low and high blood sugar and how to manage them. Always carry a quick-source of sugar with you in case you have symptoms of low blood sugar. Examples include hard sugar candy or glucose tablets. Make sure others know that you can choke if you eat or drink when you develop serious symptoms of low blood sugar, such as seizures or unconsciousness. They must get medical help at once. Tell your doctor or health care professional if you have high blood sugar. You might need to change the dose of your medicine. If you are sick or exercising more than usual, you might need to change the dose of your medicine. Do not skip meals. Ask your doctor or health care professional if you should avoid alcohol. Many nonprescription cough and cold products contain sugar or alcohol. These can affect blood sugar. Pens should never be shared. Even if the  needle is changed, sharing may result in passing of viruses like hepatitis or HIV. Wear a medical ID bracelet or chain, and carry a card that describes your disease and details of your medicine and dosage times. What side effects may I notice from receiving this medicine? Side effects that you should report to your doctor or health care professional as soon as possible:  allergic reactions like skin rash, itching or hives, swelling of the face, lips, or tongue  breathing problems  changes in vision  diarrhea that continues or is severe  lump or swelling on the neck  severe nausea  signs and symptoms of infection like fever or chills; cough; sore throat; pain or trouble passing urine  signs and symptoms of low blood sugar such as feeling anxious, confusion, dizziness, increased hunger, unusually weak or tired, sweating, shakiness, cold, irritable, headache, blurred vision, fast heartbeat, loss of consciousness  signs and symptoms of kidney injury like trouble passing urine or change in the amount of urine  trouble  swallowing  unusual stomach upset or pain  vomiting Side effects that usually do not require medical attention (report to your doctor or health care professional if they continue or are bothersome):  diarrhea  loss of appetite  nausea  pain, redness, or irritation at site where injected  stomach upset This list may not describe all possible side effects. Call your doctor for medical advice about side effects. You may report side effects to FDA at 1-800-FDA-1088. Where should I keep my medicine? Keep out of the reach of children. Store unopened pens in a refrigerator between 2 and 8 degrees C (36 and 46 degrees F). Do not freeze or use if the medicine has been frozen. Protect from light and excessive heat. Store in the carton until use. Each single-dose pen can be kept at room temperature, not to exceed 30 degrees C (86 degrees F) for a total of 14 days, if needed.  Throw away any unused medicine after the expiration date on the label. NOTE: This sheet is a summary. It may not cover all possible information. If you have questions about this medicine, talk to your doctor, pharmacist, or health care provider.  2020 Elsevier/Gold Standard (2019-02-24 09:34:53)

## 2020-04-12 ENCOUNTER — Other Ambulatory Visit: Payer: Self-pay

## 2020-04-12 ENCOUNTER — Ambulatory Visit: Payer: Medicare Other

## 2020-04-12 DIAGNOSIS — E1169 Type 2 diabetes mellitus with other specified complication: Secondary | ICD-10-CM

## 2020-04-12 DIAGNOSIS — Z794 Long term (current) use of insulin: Secondary | ICD-10-CM

## 2020-04-12 NOTE — Chronic Care Management (AMB) (Signed)
Patient completed patient assistance documentation in office for Novolog, Toujeo and Trulicity. Patient supplies proof of income for the applications. Pharmacist will submit via fax today.   Patient received sample for Trulicity in office and administered first dose. Due to multiple allergies, patient waited in office for 30 minutes after injecting. Patient denies any side effects and has her dye-free benadryl with her.   Patient will begin injecting Trulicity 0.75 mg weekly on Tuesdays. She understands to contact pharmacist with any questions or concerns.   Juliane Lack, PharmD, [facility] Clinical Pharmacist [facility] [phone] (office) [phone] (mobile)

## 2020-04-13 ENCOUNTER — Telehealth: Payer: Self-pay

## 2020-04-13 NOTE — Chronic Care Management (AMB) (Signed)
Patient is approved for Trulicity through [employer] Patient assistance 04/13/2020 - 06/24/2020.   Medication will be shipping to patient's home. Patient is aware that company will be reaching out to schedule delivery.   Pharmacist will coordinate 2022 application renewal with patient for continuation of therapy without cost limitations.   Juliane Lack, PharmD, [facility] Clinical Pharmacist [facility] [phone] (office) [phone] (mobile)

## 2020-04-24 ENCOUNTER — Telehealth: Payer: Self-pay

## 2020-04-24 NOTE — Chronic Care Management (AMB) (Signed)
    Chronic Care Management Pharmacy Assistant   Name: Suzanne Hardin  MRN: [id number] DOB: 1955-07-16  Reason for Encounter: Medication Review/Adherence Review  PCP : Abigail Miyamoto, MD  Medication Adherence Review performed, no insurance data available. Medications: Outpatient Encounter Medications as of 04/24/2020  Medication Sig  . albuterol (VENTOLIN HFA) 108 (90 Base) MCG/ACT inhaler Inhale 2 puffs into the lungs every 6 (six) hours as needed.   . Alpha-Lipoic Acid 300 MG CAPS Take 300 mg by mouth daily.  . baclofen (LIORESAL) 10 MG tablet Take 10 mg by mouth 3 (three) times daily.  . Cholecalciferol (VITAMIN D) 50 MCG (2000 UT) tablet Take 2,000 Units by mouth daily.  . ciprofloxacin (CIPRO) 500 MG tablet Take 1 tablet (500 mg total) by mouth 2 (two) times daily. (Patient not taking: Reported on 04/06/2020)  . Cyanocobalamin 1000 MCG TBCR Take 1 tablet by mouth daily.  . Dulaglutide (TRULICITY) 0.75 MG/0.5ML SOPN Inject 0.75 mg into the skin once a week.  Marland Kitchen glucose blood (CONTOUR NEXT TEST) test strip 1 each by Other route 2 (two) times daily. Use as instructed  . glucose blood test strip 1 each by Other route 3 (three) times daily as needed. Use as instructed (Patient not taking: Reported on 04/06/2020)  . hydrOXYzine (ATARAX) 10 MG/5ML syrup Take by mouth.  . hydrOXYzine (VISTARIL) 50 MG capsule Take by mouth. (Patient not taking: Reported on 04/06/2020)  . ipratropium-albuterol (DUONEB) 0.5-2.5 (3) MG/3ML SOLN Inhale 3 mLs into the lungs every 6 (six) hours as needed.   . Lancets MISC by Does not apply route.  Marland Kitchen NOVOLOG FLEXPEN 100 UNIT/ML FlexPen Inject into the skin daily. Patient uses slidding scale. Maximum 30 units uses only blood sugar >250mg /dl.  Using 8-10 units when sugars are high lately.  . pyridOXINE (VITAMIN B-6) 50 MG tablet Take 25 mg by mouth daily. Takes the p5pb6 version 25 mg daily  . Sodium Chloride-Sodium Bicarb (NETI POT SINUS WASH NA) Place  into the nose as needed.  [name] SOLOSTAR 300 UNIT/ML Solostar Pen Inject 23 Units into the skin daily. (Patient taking differently: Inject 25 Units into the skin daily. )  . TRIAMCINOLONE ACETONIDE, TOP, 0.05 % OINT Apply topically daily as needed. Uses as needed for rash, bug bites or on gums before dentist.  . zinc gluconate 50 MG tablet Take 50 mg by mouth daily. Takes Monday-Friday.   No facility-administered encounter medications on file as of 04/24/2020.    Current Diagnosis: Patient Active Problem List   Diagnosis Date Noted  . 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 BMI 31.0-31.9,adult 01/15/2020  . Hypertension 09/16/2019  . Type 2 diabetes mellitus with other specified complication (HCC) 09/16/2019  . Moderate persistent asthma 09/16/2019  . Neuropathy, peripheral 05/12/2014  . 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 Carotid artery occlusion 10/28/2013  . Arthralgia 10/28/2013  . Vitamin D deficiency 10/16/2012  . Food intolerance 09/04/2012  . Allergy history, peanuts 09/04/2012  . 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

## 2020-04-28 ENCOUNTER — Telehealth: Payer: Self-pay

## 2020-04-28 NOTE — Chronic Care Management (AMB) (Signed)
Chronic Care Management Pharmacy Assistant    Name: Suzanne Hardin  MRN: [id number] DOB: March 16, 1956  Reason for Encounter: Medication Review -   PCP : Abigail Miyamoto, MD  Allergies:   Allergies  Allergen Reactions  . Dextromethorphan-Guaifenesin Itching  . Hydrocodone Other (See Comments)    Inflammatory Inflammatory Other reaction(s): Other (See Comments) Inflammatory Inflammatory   . Lidocaine Swelling  . Other Rash, Other (See Comments) and Swelling    Neurological damage fragances fragances  Neurological damage Fragances; YELLOW NUMBER 5;  Yellow #5 Yellow #5 Yellow #5 Yellow #5  High eye pressure High eye pressure High eye pressure   . Sodium Laureth Sulfate Dermatitis, Hives, Itching, Other (See Comments), Rash and Swelling  . Sulfa Antibiotics Hives  . Sulfites Shortness Of Breath    Other reaction(s): Shortness of Breath Other reaction(s): Shortness of Breath   . Red Dye   . Articaine-Epinephrine Swelling  . Cymbalta [Duloxetine Hcl]   . Darvon [Propoxyphene]   . Duloxetine   . Gentamicin Sulfate   . Hctz [Hydrochlorothiazide]   . Humalog [Insulin Lispro]   . Keflex [Cephalexin]   . Lasix [Furosemide]   . Lisinopril-Hydrochlorothiazide   . Loratadine   . Macrodantin [Nitrofurantoin]   . Metformin   . Mucinex [Guaifenesin Er]   . Peanut-Containing Drug Products   . Penicillins   . Pioglitazone   . Tessalon Perles [Benzonatate]   . Travatan [Travoprost]   . Tylenol [Acetaminophen]   . Propofol Other (See Comments) and Nausea And Vomiting  . Sulfamethoxazole-Trimethoprim Rash    Medications: Outpatient Encounter Medications as of 04/28/2020  Medication Sig  . albuterol (VENTOLIN HFA) 108 (90 Base) MCG/ACT inhaler Inhale 2 puffs into the lungs every 6 (six) hours as needed.   . Alpha-Lipoic Acid 300 MG CAPS Take 300 mg by mouth daily.  . baclofen (LIORESAL) 10 MG tablet Take 10 mg by mouth 3 (three) times daily.  .  Cholecalciferol (VITAMIN D) 50 MCG (2000 UT) tablet Take 2,000 Units by mouth daily.  . ciprofloxacin (CIPRO) 500 MG tablet Take 1 tablet (500 mg total) by mouth 2 (two) times daily. (Patient not taking: Reported on 04/06/2020)  . Cyanocobalamin 1000 MCG TBCR Take 1 tablet by mouth daily.  . Dulaglutide (TRULICITY) 0.75 MG/0.5ML SOPN Inject 0.75 mg into the skin once a week.  Marland Kitchen glucose blood (CONTOUR NEXT TEST) test strip 1 each by Other route 2 (two) times daily. Use as instructed  . glucose blood test strip 1 each by Other route 3 (three) times daily as needed. Use as instructed (Patient not taking: Reported on 04/06/2020)  . hydrOXYzine (ATARAX) 10 MG/5ML syrup Take by mouth.  . hydrOXYzine (VISTARIL) 50 MG capsule Take by mouth. (Patient not taking: Reported on 04/06/2020)  . ipratropium-albuterol (DUONEB) 0.5-2.5 (3) MG/3ML SOLN Inhale 3 mLs into the lungs every 6 (six) hours as needed.   . Lancets MISC by Does not apply route.  Marland Kitchen NOVOLOG FLEXPEN 100 UNIT/ML FlexPen Inject into the skin daily. Patient uses slidding scale. Maximum 30 units uses only blood sugar >250mg /dl.  Using 8-10 units when sugars are high lately.  . pyridOXINE (VITAMIN B-6) 50 MG tablet Take 25 mg by mouth daily. Takes the p5pb6 version 25 mg daily  . Sodium Chloride-Sodium Bicarb (NETI POT SINUS WASH NA) Place into the nose as needed.  [name] SOLOSTAR 300 UNIT/ML Solostar Pen Inject 23 Units into the skin daily. (Patient taking differently: Inject 25 Units into the skin  daily. )  . TRIAMCINOLONE ACETONIDE, TOP, 0.05 % OINT Apply topically daily as needed. Uses as needed for rash, bug bites or on gums before dentist.  . zinc gluconate 50 MG tablet Take 50 mg by mouth daily. Takes Monday-Friday.   No facility-administered encounter medications on file as of 04/28/2020.    Current Diagnosis: Patient Active Problem List   Diagnosis Date Noted  . 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 BMI 31.0-31.9,adult 01/15/2020  .  Hypertension 09/16/2019  . Type 2 diabetes mellitus with other specified complication (HCC) 09/16/2019  . Moderate persistent asthma 09/16/2019  . Neuropathy, peripheral 05/12/2014  . 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 Carotid artery occlusion 10/28/2013  . Arthralgia 10/28/2013  . Vitamin D deficiency 10/16/2012  . Food intolerance 09/04/2012  . Allergy history, peanuts 09/04/2012  . 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 [address] [place] ,[place] [address]  Called patient to let her know that address was now correct with [facility].  Levada Dy Brown,CPP. Notified   Jon Gills, [facility] Clinical Pharmacist Assistant [phone]

## 2020-05-06 ENCOUNTER — Other Ambulatory Visit: Payer: Self-pay

## 2020-05-06 ENCOUNTER — Ambulatory Visit: Payer: Medicare Other

## 2020-05-06 DIAGNOSIS — I10 Essential (primary) hypertension: Secondary | ICD-10-CM

## 2020-05-06 DIAGNOSIS — E1169 Type 2 diabetes mellitus with other specified complication: Secondary | ICD-10-CM

## 2020-05-06 DIAGNOSIS — Z794 Long term (current) use of insulin: Secondary | ICD-10-CM

## 2020-05-06 NOTE — Patient Instructions (Signed)
Visit Information  Goals Addressed            This Visit's Progress    Pharmacy Care Plan       CARE PLAN ENTRY (see longitudinal plan of care for additional care plan information)  Current Barriers:   Chronic Disease Management support, education, and care coordination needs related to Hypertension and Diabetes   Hypertension BP Readings from Last 3 Encounters:  01/15/20 (!) 126/60  09/16/19 128/72    Pharmacist Clinical Goal(s): o Over the next 90 days, patient will work with PharmD and providers to maintain BP goal <130/80  Current regimen:  o Diet and Exercise  Interventions: o Keep up the good work with returning to exercise.  o Discussed portion control and weight loss benefites.   Patient self care activities - Over the next 90 days, patient will: o Ensure daily salt intake < 2300 mg/day  Diabetes Lab Results  Component Value Date/Time   HGBA1C 9.3 (H) 09/16/2019 08:24 AM    Pharmacist Clinical Goal(s): o Over the next 90 days, patient will work with PharmD and providers to achieve A1c goal <7%  Current regimen:  o Novolog 8-10 units sliding scale as needed - not having to use currently o Toujeo 300 units/ml 23 units twice daily o Trulicity 0.75 mg weekly  Interventions: o Discussed patient's upcoming blood work and visit.  o Reviewed improved blood sugar numbers.  o Patient assistance applications for Toujeo, Trulicity and [employer] for 2022.  o Reviewed inactive ingredients of Trulicity to determine tolerability.   Patient self care activities - Over the next 90 days, patient will: o Check blood sugar 3-4 times daily, document, and provide at future appointments o Contact provider with any episodes of hypoglycemia  Medication management  Pharmacist Clinical Goal(s): o Over the next 90 days, patient will work with PharmD and providers to maintain optimal medication adherence  Current pharmacy: [facility]  Interventions o Comprehensive  medication review performed. o Continue current medication management strategy  Patient self care activities - Over the next 90 days, patient will: o Focus on medication adherence by pill box o Take medications as prescribed o Report any questions or concerns to PharmD and/or provider(s)  Please see past updates related to this goal by clicking on the "Past Updates" button in the selected goal         Patient verbalized understanding of instructions.   Telephone follow up appointment with pharmacy team member scheduled for: 05/18/2020  Juliane Lack, PharmD, [facility] Clinical Pharmacist [facility] [phone] (office) [phone] (mobile)   DASH Eating Plan DASH stands for "Dietary Approaches to Stop Hypertension." The DASH eating plan is a healthy eating plan that has been shown to reduce high blood pressure (hypertension). It may also reduce your risk for type 2 diabetes, heart disease, and stroke. The DASH eating plan may also help with weight loss. What are tips for following this plan?  General guidelines  Avoid eating more than 2,300 mg (milligrams) of salt (sodium) a day. If you have hypertension, you may need to reduce your sodium intake to 1,500 mg a day.  Limit alcohol intake to no more than 1 drink a day for nonpregnant women and 2 drinks a day for men. One drink equals 12 oz of beer, 5 oz of wine, or 1 oz of hard liquor.  Work with your health care provider to maintain a healthy body weight or to lose weight. Ask what an ideal weight is for you.  Get at least 30 minutes of exercise that causes your heart to beat faster (aerobic exercise) most days of the week. Activities may include walking, swimming, or biking.  Work with your health care provider or diet and nutrition specialist (dietitian) to adjust your eating plan to your individual calorie needs. Reading food labels   Check food labels for the amount of sodium per serving. Choose foods with  less than 5 percent of the Daily Value of sodium. Generally, foods with less than 300 mg of sodium per serving fit into this eating plan.  To find whole grains, look for the word "whole" as the first word in the ingredient list. Shopping  Buy products labeled as "low-sodium" or "no salt added."  Buy fresh foods. Avoid canned foods and premade or frozen meals. Cooking  Avoid adding salt when cooking. Use salt-free seasonings or herbs instead of table salt or sea salt. Check with your health care provider or pharmacist before using salt substitutes.  Do not fry foods. Cook foods using healthy methods such as baking, boiling, grilling, and broiling instead.  Cook with heart-healthy oils, such as olive, canola, soybean, or sunflower oil. Meal planning  Eat a balanced diet that includes: ? 5 or more servings of fruits and vegetables each day. At each meal, try to fill half of your plate with fruits and vegetables. ? Up to 6-8 servings of whole grains each day. ? Less than 6 oz of lean meat, poultry, or fish each day. A 3-oz serving of meat is about the same size as a deck of cards. One egg equals 1 oz. ? 2 servings of low-fat dairy each day. ? A serving of nuts, seeds, or beans 5 times each week. ? Heart-healthy fats. Healthy fats called Omega-3 fatty acids are found in foods such as flaxseeds and coldwater fish, like sardines, salmon, and mackerel.  Limit how much you eat of the following: ? Canned or prepackaged foods. ? Food that is high in trans fat, such as fried foods. ? Food that is high in saturated fat, such as fatty meat. ? Sweets, desserts, sugary drinks, and other foods with added sugar. ? Full-fat dairy products.  Do not salt foods before eating.  Try to eat at least 2 vegetarian meals each week.  Eat more home-cooked food and less restaurant, buffet, and fast food.  When eating at a restaurant, ask that your food be prepared with less salt or no salt, if possible. What  foods are recommended? The items listed may not be a complete list. Talk with your dietitian about what dietary choices are best for you. Grains Whole-grain or whole-wheat bread. Whole-grain or whole-wheat pasta. Suzanne Hardin rice. Suzanne Hardin. Bulgur. Whole-grain and low-sodium cereals. Pita bread. Low-fat, low-sodium crackers. Whole-wheat flour tortillas. Vegetables Fresh or frozen vegetables (raw, steamed, roasted, or grilled). Low-sodium or reduced-sodium tomato and vegetable juice. Low-sodium or reduced-sodium tomato sauce and tomato paste. Low-sodium or reduced-sodium canned vegetables. Fruits All fresh, dried, or frozen fruit. Canned fruit in natural juice (without added sugar). Meat and other protein foods Skinless chicken or [place]. Ground chicken or [place]. Pork with fat trimmed off. Fish and seafood. Egg whites. Dried beans, peas, or lentils. Unsalted nuts, nut butters, and seeds. Unsalted canned beans. Lean cuts of beef with fat trimmed off. Low-sodium, lean deli meat. Dairy Low-fat (1%) or fat-free (skim) milk. Fat-free, low-fat, or reduced-fat cheeses. Nonfat, low-sodium ricotta or cottage cheese. Low-fat or nonfat yogurt. Low-fat, low-sodium cheese. Fats and oils Soft margarine without  trans fats. Vegetable oil. Low-fat, reduced-fat, or light mayonnaise and salad dressings (reduced-sodium). Canola, safflower, olive, soybean, and sunflower oils. Avocado. Seasoning and other foods Herbs. Spices. Seasoning mixes without salt. Unsalted popcorn and pretzels. Fat-free sweets. What foods are not recommended? The items listed may not be a complete list. Talk with your dietitian about what dietary choices are best for you. Grains Baked goods made with fat, such as croissants, muffins, or some breads. Dry pasta or rice meal packs. Vegetables Creamed or fried vegetables. Vegetables in a cheese sauce. Regular canned vegetables (not low-sodium or reduced-sodium). Regular canned tomato sauce and  paste (not low-sodium or reduced-sodium). Regular tomato and vegetable juice (not low-sodium or reduced-sodium). Suzanne Hardin. Olives. Fruits Canned fruit in a light or heavy syrup. Fried fruit. Fruit in cream or butter sauce. Meat and other protein foods Fatty cuts of meat. Ribs. Fried meat. Suzanne Hardin. Sausage. Bologna and other processed lunch meats. Salami. Fatback. Hotdogs. Bratwurst. Salted nuts and seeds. Canned beans with added salt. Canned or smoked fish. Whole eggs or egg yolks. Chicken or [place] with skin. Dairy Whole or 2% milk, cream, and half-and-half. Whole or full-fat cream cheese. Whole-fat or sweetened yogurt. Full-fat cheese. Nondairy creamers. Whipped toppings. Processed cheese and cheese spreads. Fats and oils Butter. Stick margarine. Lard. Shortening. Ghee. Bacon fat. Tropical oils, such as coconut, palm kernel, or palm oil. Seasoning and other foods Salted popcorn and pretzels. Onion salt, garlic salt, seasoned salt, table salt, and sea salt. Worcestershire sauce. Tartar sauce. Barbecue sauce. Teriyaki sauce. Soy sauce, including reduced-sodium. Steak sauce. Canned and packaged gravies. Fish sauce. Oyster sauce. Cocktail sauce. Horseradish that you find on the shelf. Ketchup. Mustard. Meat flavorings and tenderizers. Bouillon cubes. Hot sauce and Tabasco sauce. Premade or packaged marinades. Premade or packaged taco seasonings. Relishes. Regular salad dressings. Where to find more information:  National Heart, Lung, and Blood Institute: PopSteam.is  American Heart Association: www.heart.org Summary  The DASH eating plan is a healthy eating plan that has been shown to reduce high blood pressure (hypertension). It may also reduce your risk for type 2 diabetes, heart disease, and stroke.  With the DASH eating plan, you should limit salt (sodium) intake to 2,300 mg a day. If you have hypertension, you may need to reduce your sodium intake to 1,500 mg a day.  When on the DASH  eating plan, aim to eat more fresh fruits and vegetables, whole grains, lean proteins, low-fat dairy, and heart-healthy fats.  Work with your health care provider or diet and nutrition specialist (dietitian) to adjust your eating plan to your individual calorie needs. This information is not intended to replace advice given to you by your health care provider. Make sure you discuss any questions you have with your health care provider. Document Revised: 05/24/2017 Document Reviewed: 06/04/2016 Elsevier Patient Education  2020 [employer].

## 2020-05-06 NOTE — Chronic Care Management (AMB) (Signed)
Chronic Care Management Pharmacy  Name: Suzanne Hardin  MRN: [id number] DOB: 1956/06/24  Chief Complaint/ HPI  Suzanne Hardin,  64 y.o. , female presents for their Follow-Up CCM visit with the clinical pharmacist via telephone due to COVID-19 Pandemic.  PCP : Suzanne Miyamoto, MD  Their chronic conditions include: hypertension, carotid artery occlusion, moderate persistent asthma, type 2 diabetes, neuropathy, vitamin D deficiency.   Office Visits: 01/15/2020 -UTI: Urine culture klebsiella pneumoniae sensitive to cipro. Vitamin d low, CBC normal, glucose 166, kidneys and liver tests normal. TSH 1.76 normal, Choesterol LDL 132 high. Take vitamin D 2000 iu qd day   Consult Visit: 03/28/2020 - Neurology - botox injection for cervical dystonia. 12/21/2019 - Neurology - botox injection for cervical dystonia.  Medications: Outpatient Encounter Medications as of 05/06/2020  Medication Sig  . albuterol (VENTOLIN HFA) 108 (90 Base) MCG/ACT inhaler Inhale 2 puffs into the lungs every 6 (six) hours as needed.   . Alpha-Lipoic Acid 300 MG CAPS Take 300 mg by mouth daily.  . baclofen (LIORESAL) 10 MG tablet Take 10 mg by mouth 3 (three) times daily.  . Cholecalciferol (VITAMIN D) 50 MCG (2000 UT) tablet Take 2,000 Units by mouth daily.  . Cyanocobalamin 1000 MCG TBCR Take 1 tablet by mouth daily.  . Dulaglutide (TRULICITY) 0.75 MG/0.5ML SOPN Inject 0.75 mg into the skin once a week.  Marland Kitchen glucose blood (CONTOUR NEXT TEST) test strip 1 each by Other route 2 (two) times daily. Use as instructed  . glucose blood test strip 1 each by Other route 3 (three) times daily as needed. Use as instructed  . hydrOXYzine (ATARAX) 10 MG/5ML syrup Take by mouth.  Marland Kitchen ipratropium-albuterol (DUONEB) 0.5-2.5 (3) MG/3ML SOLN Inhale 3 mLs into the lungs every 6 (six) hours as needed.   . Lancets MISC by Does not apply route.  . pyridOXINE (VITAMIN B-6) 50 MG tablet Take 25 mg by mouth daily. Takes the p5pb6  version 25 mg daily  . Sodium Chloride-Sodium Bicarb (NETI POT SINUS WASH NA) Place into the nose as needed.  Suzanne Hardin SOLOSTAR 300 UNIT/ML Solostar Pen Inject 23 Units into the skin daily. (Patient taking differently: Inject 23 Units into the skin in the morning and at bedtime. )  . TRIAMCINOLONE ACETONIDE, TOP, 0.05 % OINT Apply topically daily as needed. Uses as needed for rash, bug bites or on gums before dentist.  . zinc gluconate 50 MG tablet Take 50 mg by mouth daily. Takes Monday-Friday.  . ciprofloxacin (CIPRO) 500 MG tablet Take 1 tablet (500 mg total) by mouth 2 (two) times daily. (Patient not taking: Reported on 04/06/2020)  . hydrOXYzine (VISTARIL) 50 MG capsule Take by mouth. (Patient not taking: Reported on 04/06/2020)  . NOVOLOG FLEXPEN 100 UNIT/ML FlexPen Inject into the skin daily. Patient uses slidding scale. Maximum 30 units uses only blood sugar >250mg /dl.  Using 8-10 units when sugars are high lately. (Patient not taking: Reported on 05/06/2020)   No facility-administered encounter medications on file as of 05/06/2020.   Allergies  Allergen Reactions  . Dextromethorphan-Guaifenesin Itching  . Hydrocodone Other (See Comments)    Inflammatory Inflammatory Other reaction(s): Other (See Comments) Inflammatory Inflammatory   . Lidocaine Swelling  . Other Rash, Other (See Comments) and Swelling    Neurological damage fragances fragances  Neurological damage Fragances; YELLOW NUMBER 5;  Yellow #5 Yellow #5 Yellow #5 Yellow #5  High eye pressure High eye pressure High eye pressure   . Sodium Laureth Sulfate  Dermatitis, Hives, Itching, Other (See Comments), Rash and Swelling  . Sulfa Antibiotics Hives  . Sulfites Shortness Of Breath    Other reaction(s): Shortness of Breath Other reaction(s): Shortness of Breath   . Red Dye   . Articaine-Epinephrine Swelling  . Cymbalta [Duloxetine Hcl]   . Darvon [Propoxyphene]   . Duloxetine   . Gentamicin Sulfate     . Hctz [Hydrochlorothiazide]   . Humalog [Insulin Lispro]   . Keflex [Cephalexin]   . Lasix [Furosemide]   . Lisinopril-Hydrochlorothiazide   . Loratadine   . Macrodantin [Nitrofurantoin]   . Metformin   . Mucinex [Guaifenesin Er]   . Peanut-Containing Drug Products   . Penicillins   . Pioglitazone   . Tessalon Perles [Benzonatate]   . Travatan [Travoprost]   . Tylenol [Acetaminophen]   . Propofol Other (See Comments) and Nausea And Vomiting  . Sulfamethoxazole-Trimethoprim Rash   SDOH Screenings   Alcohol Screen:   . Last Alcohol Screening Score (AUDIT): Not on file  Depression (PHQ2-9): Low Risk   . PHQ-2 Score: 0  Financial Resource Strain:   . Difficulty of Paying Living Expenses: Not on file  Food Insecurity:   . Worried About Programme researcher, broadcasting/film/videounning Out of Food in the Last Year: Not on file  . Ran Out of Food in the Last Year: Not on file  Housing:   . Last Housing Risk Score: Not on file  Physical Activity:   . Days of Exercise per Week: Not on file  . Minutes of Exercise per Session: Not on file  Social Connections:   . Frequency of Communication with Friends and Family: Not on file  . Frequency of Social Gatherings with Friends and Family: Not on file  . Attends Religious Services: Not on file  . Active Member of Clubs or Organizations: Not on file  . Attends BankerClub or Organization Meetings: Not on file  . Marital Status: Not on file  Stress:   . Feeling of Stress : Not on file  Tobacco Use: Low Risk   . Smoking Tobacco Use: Never Smoker  . Smokeless Tobacco Use: Never Used  Transportation Needs:   . Freight forwarderLack of Transportation (Medical): Not on file  . Lack of Transportation (Non-Medical): Not on file    Current Diagnosis/Assessment:  Goals Addressed            This Visit's Progress   . Pharmacy Care Plan       CARE PLAN ENTRY (see longitudinal plan of care for additional care plan information)  Current Barriers:  . Chronic Disease Management support, education,  and care coordination needs related to Hypertension and Diabetes   Hypertension BP Readings from Last 3 Encounters:  01/15/20 (!) 126/60  09/16/19 128/72   . Pharmacist Clinical Goal(s): o Over the next 90 days, patient will work with PharmD and providers to maintain BP goal <130/80 . Current regimen:  o Diet and Exercise . Interventions: o Keep up the good work with returning to exercise.  o Discussed portion control and weight loss benefites.  . Patient self care activities - Over the next 90 days, patient will: o Ensure daily salt intake < 2300 mg/day  Diabetes Lab Results  Component Value Date/Time   HGBA1C 9.3 (H) 09/16/2019 08:24 AM   . Pharmacist Clinical Goal(s): o Over the next 90 days, patient will work with PharmD and providers to achieve A1c goal <7% . Current regimen:  o Novolog 8-10 units sliding scale as needed - not having to use  currently o Toujeo 300 units/ml 23 units twice daily o Trulicity 0.75 mg weekly . Interventions: o Discussed patient's upcoming blood work and visit.  o Reviewed improved blood sugar numbers.  o Patient assistance applications for Toujeo, Trulicity and [employer] for 2022.  o Reviewed inactive ingredients of Trulicity to determine tolerability.  . Patient self care activities - Over the next 90 days, patient will: o Check blood sugar 3-4 times daily, document, and provide at future appointments o Contact provider with any episodes of hypoglycemia  Medication management . Pharmacist Clinical Goal(s): o Over the next 90 days, patient will work with PharmD and providers to maintain optimal medication adherence . Current pharmacy: [facility] . Interventions o Comprehensive medication review performed. o Continue current medication management strategy . Patient self care activities - Over the next 90 days, patient will: o Focus on medication adherence by pill box o Take medications as prescribed o Report any questions or concerns  to PharmD and/or provider(s)  Please see past updates related to this goal by clicking on the "Past Updates" button in the selected goal          Asthma     Eosinophil count:  No results found for: EOSPCT%                               Eos (Absolute):  Lab Results  Component Value Date/Time   EOSABS 0.2 01/15/2020 09:07 AM    Tobacco Status:  Social History   Tobacco Use  Smoking Status Never Smoker  Smokeless Tobacco Never Used    Patient has failed these meds in past: none reported  Patient is currently controlled on the following medications:   Albuterol hfa inhaler prn  Duoneb 0.5-2.5 mg/46ml prn Using maintenance inhaler regularly? No Frequency of rescue inhaler use:  infrequently   We discussed:  proper inhaler technique. Asthma improved 10 months after last vaccine. Rarely uses inhaler or nebulizer. If she gets a cold or infection, she has to use inhalers then only.   Plan  Continue current medications   Diabetes   Recent Relevant Labs: Lab Results  Component Value Date/Time   HGBA1C 9.3 (H) 09/16/2019 08:24 AM   MICROALBUR 80 01/15/2020 08:28 AM    Checking BG: Four times daily  Recent FBG Readings: ~100 mg/dL  Patient has failed these meds in past: metformin Patient is currently uncontrolled on the following medications:   Novolog - up to 30 units sliding scale - not using due to good blood sugar readings  Tojeo Solostar 300 units/ml 25 units into the skin twice daily  Trulicity 0.75 mg weekly   Blood sugar testing strip and lancets tid prn  Last diabetic Foot exam: No results found for: HMDIABEYEEXA  Last diabetic Eye exam: No results found for: HMDIABFOOTEX   We discussed: diet and exercise extensively. Patient continues daily pool exercises. Reports feeling great. Patient has her son staying with her right now and is cooking/eating a little more.   Tolerating Trulicity well and reports beginning stomach upset symptoms improved. Blood  sugars have improved and patient stopped meal time insulin due to blood sugars being at goal. Patient see Dr. Marina Goodell 05/17/2020 for updated blood work. Patient is appreciative of receiving medication through PAP. We will coordinate renewal for 2022.   Patient would like to try 0.75 mg of Trulicity for 3-4 months before increasing dose. Patient doesn't think her upcoming A1C will be at goal  due to only being on Trulicity 1 month. She reports prior to beginning Trulicity blood sugar readings had increased to 200 mg/dL at time. Plan  Continue current medications and control with diet and exercise  and  Hypertension   BP today is:  <130/80  Office blood pressures are  BP Readings from Last 3 Encounters:  01/15/20 (!) 126/60  09/16/19 128/72    Patient has failed these meds in the past: lisinopril, valsartan, losartan Patient is currently controlled on the following medications:  Diet and Exercise  Patient home BP readings are ranging: well controlled.   We discussed diet and exercise extensively  Plan  Continue control with diet and exercise     Health Maintenance   Patient is currently controlled on the following medications:  . hydroxyzine 10 mg/5 ml syrup - uses for food allergies if benadryl . Vitamin B12 1000 mcg daily  . Baclofen 10 mg tid prn - muscle pain and spasm before baclofen . Vitamin D 2000 units daily . P5P Vitamin B6 - supplementation . Alpha lipoic acid 300 mg daily - supplementation  We discussed:   Patient has to be very careful with medications and diet due to multiple allergies. Sulfites in medications cause a reaction. Lanolin also created a problem in many supplements. She can no longer find an Epi-pen on the market without sulfite. She has to be careful with preservatives in foods. She can't tolerate sulfates.   Plan  Continue current medications  Vaccines   Reviewed and discussed patient's vaccination history.    Patient cannot take a flu shot  due to allergic reaction. 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 She cannot take any vaccines due to this.   Immunization History  Administered Date(s) Administered  . Influenza-Unspecified 04/09/2013    Plan  Patient cannot tolerate any vaccines due to severe reaction to flu shot in the past.   Medication Management   Pt uses [facility] for all medications Uses pill box? Yes Pt endorses good compliance  We discussed: Current pharmacy is preferred with insurance plan and patient is satisfied with pharmacy services  Plan  Continue current medication management strategy    Follow up: 2 week phone visit to review labs and discuss reducing insulin dose

## 2020-05-16 NOTE — Chronic Care Management (AMB) (Signed)
Chronic Care Management Pharmacy  Name: Suzanne Hardin  MRN: [id number] DOB: [birth year]  Chief Complaint/ HPI  Suzanne Hardin,  64 y.o. , female presents for their Follow-Up CCM visit with the clinical pharmacist via telephone due to COVID-19 Pandemic.  PCP : Abigail Miyamoto, MD  Their chronic conditions include: hypertension, carotid artery occlusion, moderate persistent asthma, type 2 diabetes, neuropathy, vitamin D deficiency.   Office Visits: 01/15/2020 -UTI: Urine culture klebsiella pneumoniae sensitive to cipro. Vitamin d low, CBC normal, glucose 166, kidneys and liver tests normal. TSH 1.76 normal, Choesterol LDL 132 high. Take vitamin D 2000 iu qd day   Consult Visit: 03/28/2020 - Neurology - botox injection for cervical dystonia. 12/21/2019 - Neurology - botox injection for cervical dystonia.  Medications: Outpatient Encounter Medications as of 05/18/2020  Medication Sig  . Dulaglutide (TRULICITY) 0.75 MG/0.5ML SOPN Inject 0.75 mg into the skin once a week.  Marland Kitchen albuterol (VENTOLIN HFA) 108 (90 Base) MCG/ACT inhaler Inhale 2 puffs into the lungs every 6 (six) hours as needed.   . Alpha-Lipoic Acid 300 MG CAPS Take 300 mg by mouth daily.  . baclofen (LIORESAL) 10 MG tablet Take 10 mg by mouth 3 (three) times daily.  . Cholecalciferol (VITAMIN D) 50 MCG (2000 UT) tablet Take 2,000 Units by mouth daily.  . ciprofloxacin (CIPRO) 500 MG tablet Take 1 tablet (500 mg total) by mouth 2 (two) times daily.  . Cyanocobalamin 1000 MCG TBCR Take 1 tablet by mouth daily.  . D-Mannose POWD Take by mouth.  Marland Kitchen glucose blood (CONTOUR NEXT TEST) test strip 1 each by Other route 2 (two) times daily. Use as instructed  . glucose blood test strip 1 each by Other route 3 (three) times daily as needed. Use as instructed  . hydrOXYzine (ATARAX) 10 MG/5ML syrup Take by mouth.  . hydrOXYzine (VISTARIL) 50 MG capsule Take by mouth.   Marland Kitchen ipratropium-albuterol (DUONEB) 0.5-2.5 (3) MG/3ML SOLN  Inhale 3 mLs into the lungs every 6 (six) hours as needed.   . Lancets MISC by Does not apply route.  Marland Kitchen NOVOLOG FLEXPEN 100 UNIT/ML FlexPen Inject into the skin daily. Patient uses slidding scale. Maximum 30 units uses only blood sugar >250mg /dl.  Using 8-10 units when sugars are high lately. (Patient not taking: Reported on 05/18/2020)  . pyridOXINE (VITAMIN B-6) 50 MG tablet Take 25 mg by mouth daily. Takes the p5pb6 version 25 mg daily  . Sodium Chloride-Sodium Bicarb (NETI POT SINUS WASH NA) Place into the nose as needed.  [name] SOLOSTAR 300 UNIT/ML Solostar Pen Inject 23 Units into the skin daily. (Patient taking differently: Inject 23 Units into the skin in the morning and at bedtime. )  . TRIAMCINOLONE ACETONIDE, TOP, 0.05 % OINT Apply topically daily as needed. Uses as needed for rash, bug bites or on gums before dentist.  . Zinc 25 MG TABS Take by mouth.  . zinc gluconate 50 MG tablet Take 50 mg by mouth daily. Takes Monday-Friday.  . [DISCONTINUED] ciprofloxacin (CIPRO) 500 MG tablet Take 1 tablet (500 mg total) by mouth 2 (two) times daily.   No facility-administered encounter medications on file as of 05/18/2020.   Allergies  Allergen Reactions  . Dextromethorphan-Guaifenesin Itching  . Hydrocodone Other (See Comments)    Inflammatory Inflammatory Other reaction(s): Other (See Comments) Inflammatory Inflammatory   . Lidocaine Swelling  . Other Rash, Other (See Comments) and Swelling    Neurological damage fragances fragances  Neurological damage Fragances; YELLOW NUMBER 5;  Yellow #5 Yellow #5 Yellow #5 Yellow #5  High eye pressure High eye pressure High eye pressure   . Sodium Laureth Sulfate Dermatitis, Hives, Itching, Other (See Comments), Rash and Swelling  . Sulfa Antibiotics Hives  . Sulfites Shortness Of Breath    Other reaction(s): Shortness of Breath Other reaction(s): Shortness of Breath   . Red Dye   . Articaine-Epinephrine Swelling  .  Cymbalta [Duloxetine Hcl]   . Darvon [Propoxyphene]   . Duloxetine   . Gentamicin Sulfate   . Hctz [Hydrochlorothiazide]   . Humalog [Insulin Lispro]   . Keflex [Cephalexin]   . Lasix [Furosemide]   . Lisinopril-Hydrochlorothiazide   . Loratadine   . Macrodantin [Nitrofurantoin]   . Metformin   . Mucinex [Guaifenesin Er]   . Peanut-Containing Drug Products   . Penicillins   . Pioglitazone   . Tessalon Perles [Benzonatate]   . Travatan [Travoprost]   . Tylenol [Acetaminophen]   . Propofol Other (See Comments) and Nausea And Vomiting  . Sulfamethoxazole-Trimethoprim Rash   SDOH Screenings   Alcohol Screen:   . Last Alcohol Screening Score (AUDIT): Not on file  Depression (PHQ2-9): Low Risk   . PHQ-2 Score: 0  Financial Resource Strain:   . Difficulty of Paying Living Expenses: Not on file  Food Insecurity:   . Worried About Programme researcher, broadcasting/film/video in the Last Year: Not on file  . Ran Out of Food in the Last Year: Not on file  Housing:   . Last Housing Risk Score: Not on file  Physical Activity:   . Days of Exercise per Week: Not on file  . Minutes of Exercise per Session: Not on file  Social Connections:   . Frequency of Communication with Friends and Family: Not on file  . Frequency of Social Gatherings with Friends and Family: Not on file  . Attends Religious Services: Not on file  . Active Member of Clubs or Organizations: Not on file  . Attends Banker Meetings: Not on file  . Marital Status: Not on file  Stress:   . Feeling of Stress : Not on file  Tobacco Use: Low Risk   . Smoking Tobacco Use: Never Smoker  . Smokeless Tobacco Use: Never Used  Transportation Needs:   . Freight forwarder (Medical): Not on file  . Lack of Transportation (Non-Medical): Not on file    Current Diagnosis/Assessment:  Goals Addressed            This Visit's Progress   . Pharmacy Care Plan       CARE PLAN ENTRY (see longitudinal plan of care for additional  care plan information)  Current Barriers:  . Chronic Disease Management support, education, and care coordination needs related to Hypertension and Diabetes   Hypertension BP Readings from Last 3 Encounters:  05/17/20 120/88  01/15/20 (!) 126/60  09/16/19 128/72   . Pharmacist Clinical Goal(s): o Over the next 90 days, patient will work with PharmD and providers to maintain BP goal <130/80 . Current regimen:  o Diet and Exercise . Interventions: o Keep up the good work with returning to exercise.  o Discussed portion control and weight loss benefites.  . Patient self care activities - Over the next 90 days, patient will: o Ensure daily salt intake < 2300 mg/day  Diabetes Lab Results  Component Value Date/Time   HGBA1C 7.5 (H) 05/17/2020 09:21 AM   HGBA1C 9.3 (H) 09/16/2019 08:24 AM   . Pharmacist Clinical Goal(s): o  Over the next 90 days, patient will work with PharmD and providers to achieve A1c goal <7% . Current regimen:  o Novolog 8-10 units sliding scale as needed - not having to use currently o Toujeo 300 units/ml 23 units twice daily o Trulicity 0.75 mg weekly . Interventions: o Discussed patient's blood work and visit.  o Reviewed improved blood sugar numbers.  o Patient assistance applications for Toujeo, Trulicity and [employer] for 2022.  Marland Kitchen Patient self care activities - Over the next 90 days, patient will: o Check blood sugar 3-4 times daily, document, and provide at future appointments o Contact provider with any episodes of hypoglycemia  Medication management . Pharmacist Clinical Goal(s): o Over the next 90 days, patient will work with PharmD and providers to maintain optimal medication adherence . Current pharmacy: [facility] . Interventions o Comprehensive medication review performed. o Continue current medication management strategy . Patient self care activities - Over the next 90 days, patient will: o Focus on medication adherence by pill  box o Take medications as prescribed o Report any questions or concerns to PharmD and/or provider(s)  Please see past updates related to this goal by clicking on the "Past Updates" button in the selected goal          Diabetes   Recent Relevant Labs: Lab Results  Component Value Date/Time   HGBA1C 7.5 (H) 05/17/2020 09:21 AM   HGBA1C 9.3 (H) 09/16/2019 08:24 AM   MICROALBUR 80 01/15/2020 08:28 AM    Checking BG: Four times daily  Recent FBG Readings: ~100 mg/dL  Patient has failed these meds in past: metformin Patient is currently uncontrolled on the following medications:   Novolog - up to 30 units sliding scale - not using due to good blood sugar readings  Tojeo Solostar 300 units/ml 22-23 units into the skin twice daily  Trulicity 0.75 mg weekly   Blood sugar testing strip and lancets tid prn  Last diabetic Foot exam: 04/2020 Last diabetic Eye exam: 08/2019 - Dr. Valda Favia for pressure next visit 07/2020  We discussed: diet and exercise extensively. Patient continues daily pool exercises. Reports feeling great. Patient has her son staying with her right now and is cooking/eating a little more.   Patient is ready to renew patient assistance for 2022. Pleased with A1C results from yesterday's blood work. She would like to Trulicity 0.75 mg weekly dose for now. A1C improved in 6 weeks to 7.5%.   Plan  Continue current medications and control with diet and exercise  and  Hypertension   BP today is:  <130/80  Office blood pressures are  BP Readings from Last 3 Encounters:  05/17/20 120/88  01/15/20 (!) 126/60  09/16/19 128/72    Patient has failed these meds in the past: lisinopril, valsartan, losartan Patient is currently controlled on the following medications:  Diet and Exercise  Patient home BP readings are ranging: well controlled.   We discussed diet and exercise extensively  Plan  Continue control with diet and exercise    Medication Management    Pt uses [facility] pharmacy for all medications Uses pill box? Yes Pt endorses good compliance  We discussed: Current pharmacy is preferred with insurance plan and patient is satisfied with pharmacy services  Plan  Continue current medication management strategy    Follow up: 2 months

## 2020-05-17 ENCOUNTER — Ambulatory Visit: Payer: Medicare Other | Admitting: Legal Medicine

## 2020-05-17 ENCOUNTER — Other Ambulatory Visit: Payer: Self-pay

## 2020-05-17 ENCOUNTER — Encounter: Payer: Self-pay | Admitting: Legal Medicine

## 2020-05-17 VITALS — BP 120/88 | HR 73 | Temp 97.8°F | Resp 16 | Ht 66.3 in | Wt 198.0 lb

## 2020-05-17 DIAGNOSIS — I10 Essential (primary) hypertension: Secondary | ICD-10-CM | POA: Diagnosis not present

## 2020-05-17 DIAGNOSIS — J4540 Moderate persistent asthma, uncomplicated: Secondary | ICD-10-CM | POA: Diagnosis not present

## 2020-05-17 DIAGNOSIS — N3001 Acute cystitis with hematuria: Secondary | ICD-10-CM

## 2020-05-17 DIAGNOSIS — Z794 Long term (current) use of insulin: Secondary | ICD-10-CM | POA: Diagnosis not present

## 2020-05-17 DIAGNOSIS — E1169 Type 2 diabetes mellitus with other specified complication: Secondary | ICD-10-CM | POA: Diagnosis not present

## 2020-05-17 DIAGNOSIS — Z6831 Body mass index (BMI) 31.0-31.9, adult: Secondary | ICD-10-CM

## 2020-05-17 LAB — POCT URINALYSIS DIP (CLINITEK)
Bilirubin, UA: NEGATIVE
Glucose, UA: NEGATIVE mg/dL
Ketones, POC UA: NEGATIVE mg/dL
Nitrite, UA: POSITIVE — AB
POC PROTEIN,UA: 30 — AB
Spec Grav, UA: >=1.03 — AB (ref 1.010–1.025)
Urobilinogen, UA: 0.2 E.U./dL
pH, UA: 5.5 (ref 5.0–8.0)

## 2020-05-17 MED ORDER — CIPROFLOXACIN HCL 500 MG PO TABS: 500.0000 mg | ORAL_TABLET | Freq: Two times a day (BID) | ORAL | 0 refills | Status: DC

## 2020-05-17 MED ORDER — FLUCONAZOLE 150 MG PO TABS: 150.0000 mg | ORAL_TABLET | Freq: Once | ORAL | 0 refills | Status: AC

## 2020-05-17 NOTE — Progress Notes (Signed)
Subjective:  Patient ID: Suzanne Hardin, female    DOB: 1956/01/15  Age: 64 y.o. MRN: [id number]  Chief Complaint  Patient presents with  . Hypertension  . Diabetes    HPI: chronic visit  Patient present with type 2 diabetes.  Specifically, this is type 2, insulin requiring diabetes, complicated by hypertension and hypercholesterolemia.  Compliance with treatment has been good; patient take medicines as directed, maintains diet and exercise regimen, follows up as directed, and is keeping glucose diary.  Date of  diagnosis 2010.  Depression screen has been performed.Tobacco screen nonsmoker. Current medicines for diabetes toujeo 22 units twice a day and trulicity.  Patient is on none for renal protection and none- patient refuses for cholesterol control.  Patient performs foot exams daily and last ophthalmologic exam was yes.  Patient presents for follow up of hypertension.  Patient tolerating none well with side effects.  Patient was diagnosed with hypertension 2010 so has been treated for hypertension for 10 years.Patient is working on maintaining diet and exercise regimen and follows up as directed. Complication include none.   Current Outpatient Medications on File Prior to Visit  Medication Sig Dispense Refill  . albuterol (VENTOLIN HFA) 108 (90 Base) MCG/ACT inhaler Inhale 2 puffs into the lungs every 6 (six) hours as needed.     . Alpha-Lipoic Acid 300 MG CAPS Take 300 mg by mouth daily.    . baclofen (LIORESAL) 10 MG tablet Take 10 mg by mouth 3 (three) times daily.    . Cholecalciferol (VITAMIN D) 50 MCG (2000 UT) tablet Take 2,000 Units by mouth daily.    . Cyanocobalamin 1000 MCG TBCR Take 1 tablet by mouth daily.    . D-Mannose POWD Take by mouth.    . Dulaglutide (TRULICITY) 0.75 MG/0.5ML SOPN Inject 0.75 mg into the skin once a week. 2 mL 6  . glucose blood (CONTOUR NEXT TEST) test strip 1 each by Other route 2 (two) times daily. Use as instructed 100 each 2  . glucose blood  test strip 1 each by Other route 3 (three) times daily as needed. Use as instructed    . hydrOXYzine (ATARAX) 10 MG/5ML syrup Take by mouth.    . hydrOXYzine (VISTARIL) 50 MG capsule Take by mouth.     Marland Kitchen ipratropium-albuterol (DUONEB) 0.5-2.5 (3) MG/3ML SOLN Inhale 3 mLs into the lungs every 6 (six) hours as needed.     . Lancets MISC by Does not apply route.    Marland Kitchen NOVOLOG FLEXPEN 100 UNIT/ML FlexPen Inject into the skin daily. Patient uses slidding scale. Maximum 30 units uses only blood sugar >250mg /dl.  Using 8-10 units when sugars are high lately. (Patient not taking: Reported on 05/18/2020)    . pyridOXINE (VITAMIN B-6) 50 MG tablet Take 25 mg by mouth daily. Takes the p5pb6 version 25 mg daily    . Sodium Chloride-Sodium Bicarb (NETI POT SINUS WASH NA) Place into the nose as needed.    [name] SOLOSTAR 300 UNIT/ML Solostar Pen Inject 23 Units into the skin daily. (Patient taking differently: Inject 23 Units into the skin in the morning and at bedtime. ) 6 pen 6  . TRIAMCINOLONE ACETONIDE, TOP, 0.05 % OINT Apply topically daily as needed. Uses as needed for rash, bug bites or on gums before dentist.    . Zinc 25 MG TABS Take by mouth.    . zinc gluconate 50 MG tablet Take 50 mg by mouth daily. Takes Monday-Friday.     No  current facility-administered medications on file prior to visit.   Past Medical History:  Diagnosis Date  . 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 Cystitis   . Essential hypertension 09/16/2019  . Hypertension   . Moderate persistent asthma 09/16/2019  . Sciatica of right side   . Type 2 diabetes mellitus with other specified complication (HCC) 09/16/2019   Past Surgical History:  Procedure Laterality Date  . APPENDECTOMY  2000  . CATARACT EXTRACTION, BILATERAL    . CHOLECYSTECTOMY  2000  . TONSILLECTOMY AND ADENOIDECTOMY      Family History  Problem Relation Age of Onset  . Ulcers Father   . Peripheral Artery Disease Father    Social History   Socioeconomic  History  . Marital status: Divorced    Spouse name: Not on file  . Number of children: 2  . Years of education: Not on file  . Highest education level: Not on file  Occupational History  . Occupation: Disabled  Tobacco Use  . Smoking status: Never Smoker  . Smokeless tobacco: Never Used  Substance and Sexual Activity  . Alcohol use: Never  . Drug use: Never  . Sexual activity: Yes    Partners: Male  Other Topics Concern  . Not on file  Social History Narrative  . Not on file   Social Determinants of Health   Financial Resource Strain:   . Difficulty of Paying Living Expenses: Not on file  Food Insecurity:   . Worried About Programme researcher, broadcasting/film/video in the Last Year: Not on file  . Ran Out of Food in the Last Year: Not on file  Transportation Needs:   . Lack of Transportation (Medical): Not on file  . Lack of Transportation (Non-Medical): Not on file  Physical Activity:   . Days of Exercise per Week: Not on file  . Minutes of Exercise per Session: Not on file  Stress:   . Feeling of Stress : Not on file  Social Connections:   . Frequency of Communication with Friends and Family: Not on file  . Frequency of Social Gatherings with Friends and Family: Not on file  . Attends Religious Services: Not on file  . Active Member of Clubs or Organizations: Not on file  . Attends Banker Meetings: Not on file  . Marital Status: Not on file    Review of Systems  Constitutional: Negative for activity change and appetite change.  HENT: Negative for congestion, sinus pressure and sinus pain.   Eyes: Negative for visual disturbance.  Respiratory: Negative for cough, choking, chest tightness and shortness of breath.   Cardiovascular: Negative for chest pain, palpitations and leg swelling.  Gastrointestinal: Negative.   Endocrine: Negative for polyuria.  Genitourinary: Positive for dysuria. Negative for difficulty urinating.  Musculoskeletal: Negative.   Neurological:  Negative.   Psychiatric/Behavioral: Negative.      Objective:  BP 120/88   Pulse 73   Temp 97.8 F (36.6 C)   Resp 16   Ht 5' 6.3" (1.684 m)   Wt 198 lb (89.8 kg)   SpO2 99%   BMI 31.67 kg/m   BP/Weight 05/17/2020 01/15/2020 09/16/2019  Systolic BP 120 126 128  Diastolic BP 88 60 72  Wt. (Lbs) 198 200.4 199.6  BMI 31.67 31.86 32.22    Physical Exam Vitals reviewed.  Constitutional:      Appearance: Normal appearance. She is normal weight.  HENT:     Head: Normocephalic.     Right Ear: Tympanic membrane, ear  canal and external ear normal.     Left Ear: Tympanic membrane, ear canal and external ear normal.     Mouth/Throat:     Mouth: Mucous membranes are moist.     Pharynx: Oropharynx is clear.  Eyes:     Extraocular Movements: Extraocular movements intact.     Conjunctiva/sclera: Conjunctivae normal.     Pupils: Pupils are equal, round, and reactive to light.  Cardiovascular:     Rate and Rhythm: Normal rate and regular rhythm.     Pulses: Normal pulses.     Heart sounds: Normal heart sounds.  Pulmonary:     Effort: Pulmonary effort is normal.     Breath sounds: Normal breath sounds.  Abdominal:     General: Abdomen is flat. Bowel sounds are normal.     Palpations: Abdomen is soft.  Musculoskeletal:        General: Normal range of motion.     Cervical back: Normal range of motion and neck supple.  Skin:    General: Skin is warm.     Capillary Refill: Capillary refill takes less than 2 seconds.  Neurological:     General: No focal deficit present.     Mental Status: She is alert and oriented to person, place, and time. Mental status is at baseline.  Psychiatric:        Mood and Affect: Mood normal.        Thought Content: Thought content normal.        Judgment: Judgment normal.     Diabetic Foot Exam - Simple   Simple Foot Form Diabetic Foot exam was performed with the following findings: Yes 05/17/2020  9:15 AM  Visual Inspection No deformities, no  ulcerations, no other skin breakdown bilaterally: Yes Sensation Testing Intact to touch and monofilament testing bilaterally: Yes Pulse Check Posterior Tibialis and Dorsalis pulse intact bilaterally: Yes Comments      Lab Results  Component Value Date   WBC 8.3 05/17/2020   HGB 13.3 05/17/2020   HCT 39.3 05/17/2020   PLT 319 05/17/2020   GLUCOSE 94 05/17/2020   CHOL 182 05/17/2020   TRIG 58 05/17/2020   HDL 57 05/17/2020   LDLCALC 114 (H) 05/17/2020   ALT 16 05/17/2020   AST 25 05/17/2020   NA 144 05/17/2020   K 4.1 05/17/2020   CL 106 05/17/2020   CREATININE 0.91 05/17/2020   BUN 11 05/17/2020   CO2 25 05/17/2020   TSH 1.760 01/15/2020   HGBA1C 7.5 (H) 05/17/2020   MICROALBUR 80 01/15/2020      Assessment & Plan:   1. Acute cystitis with hematuria - POCT URINALYSIS DIP (CLINITEK) - Urine Culture Patient has cystitis, and we will treat with antibiotics  2. Primary hypertension - CBC with Differential/Platelet - Comprehensive metabolic panel An individual hypertension care plan was established and reinforced today.  The patient's status was assessed using clinical findings on exam and labs or diagnostic tests. The patient's success at meeting treatment goals on disease specific evidence-based guidelines and found to be well controlled. SELF MANAGEMENT: The patient and I together assessed ways to personally work towards obtaining the recommended goals. RECOMMENDATIONS: avoid decongestants found in common cold remedies, decrease consumption of alcohol, perform routine monitoring of BP with home BP cuff, exercise, reduction of dietary salt, take medicines as prescribed, try not to miss doses and quit smoking.  Regular exercise and maintaining a healthy weight is needed.  Stress reduction may help. A CLINICAL SUMMARY including written  plan identify barriers to care unique to individual due to social or financial issues.  We attempt to mutually creat solutions for individual  and family understanding.  3. Moderate persistent asthma, unspecified whether complicated This patient has asthma mild and is on albuterol.  Patient is not having a flair.  Chronic medicines include duoneb. Addition new medicines none.  Asthma action plan is in place.   4. Type 2 diabetes mellitus with other specified complication, with long-term current use of insulin (HCC) - Hemoglobin A1c - Lipid panel An individual care plan for diabetes was established and reinforced today.  The patient's status was assessed using clinical findings on exam, labs and diagnostic testing. Patient success at meeting goals based on disease specific evidence-based guidelines and found to be good controlled. Medications were assessed and patient's understanding of the medical issues , including barriers were assessed. Recommend adherence to a diabetic diet, a graduated exercise program, HgbA1c level is checked quarterly, and urine microalbumin performed yearly .  Annual mono-filament sensation testing performed. Lower blood pressure and control hyperlipidemia is important. Get annual eye exams and annual flu shots and smoking cessation discussed.  Self management goals were discussed.  5. BMI 31.0-31.9,adult An individualize plan was formulated for obesity using patient history and physical exam to encourage weight loss.  An evidence based program was formulated.  Patient is to cut portion size with meals and to plan physical exercise 3 days a week at least 20 minutes.  Weight watchers and other programs are helpful.  Planned amount of weight loss 5 lbs.    Meds ordered this encounter  Medications  . ciprofloxacin (CIPRO) 500 MG tablet    Sig: Take 1 tablet (500 mg total) by mouth 2 (two) times daily.    Dispense:  20 tablet    Refill:  0  . fluconazole (DIFLUCAN) 150 MG tablet    Sig: Take 1 tablet (150 mg total) by mouth once for 1 dose.    Dispense:  1 tablet    Refill:  0    Orders Placed This Encounter   Procedures  . Urine Culture  . CBC with Differential/Platelet  . Comprehensive metabolic panel  . Hemoglobin A1c  . Lipid panel  . Cardiovascular Risk Assessment  . POCT URINALYSIS DIP (CLINITEK)      I spent 30 minutes dedicated to the care of this patient on the date of this encounter to include face-to-face time with the patient, as well as: extensive discussion on immunizations and her reluctance to be immunized.  Follow-up: Return in about 3 months (around 08/17/2020) for fasting.  An After Visit Summary was printed and given to the patient.  Brent Bulla, MD [facility] [phone]

## 2020-05-18 ENCOUNTER — Ambulatory Visit: Payer: Medicare Other

## 2020-05-18 DIAGNOSIS — E1169 Type 2 diabetes mellitus with other specified complication: Secondary | ICD-10-CM

## 2020-05-18 DIAGNOSIS — I10 Essential (primary) hypertension: Secondary | ICD-10-CM

## 2020-05-18 DIAGNOSIS — Z794 Long term (current) use of insulin: Secondary | ICD-10-CM

## 2020-05-18 LAB — COMPREHENSIVE METABOLIC PANEL
ALT: 16 IU/L (ref 0–32)
AST: 25 IU/L (ref 0–40)
Albumin/Globulin Ratio: 2.2 (ref 1.2–2.2)
Albumin: 4.4 g/dL (ref 3.8–4.8)
Alkaline Phosphatase: 105 IU/L (ref 44–121)
BUN/Creatinine Ratio: 12 (ref 12–28)
BUN: 11 mg/dL (ref 8–27)
Bilirubin Total: 0.6 mg/dL (ref 0.0–1.2)
CO2: 25 mmol/L (ref 20–29)
Calcium: 9.5 mg/dL (ref 8.7–10.3)
Chloride: 106 mmol/L (ref 96–106)
Creatinine, Ser: 0.91 mg/dL (ref 0.57–1.00)
GFR calc Af Amer: 78 mL/min/{1.73_m2} (ref >59)
GFR calc non Af Amer: 67 mL/min/{1.73_m2} (ref >59)
Globulin, Total: 2 g/dL (ref 1.5–4.5)
Glucose: 94 mg/dL (ref 65–99)
Potassium: 4.1 mmol/L (ref 3.5–5.2)
Sodium: 144 mmol/L (ref 134–144)
Total Protein: 6.4 g/dL (ref 6.0–8.5)

## 2020-05-18 LAB — CBC WITH DIFFERENTIAL/PLATELET
Basophils Absolute: 0.1 10*3/uL (ref 0.0–0.2)
Basos: 1 %
EOS (ABSOLUTE): 0.1 10*3/uL (ref 0.0–0.4)
Eos: 2 %
Hematocrit: 39.3 % (ref 34.0–46.6)
Hemoglobin: 13.3 g/dL (ref 11.1–15.9)
Immature Grans (Abs): 0 10*3/uL (ref 0.0–0.1)
Immature Granulocytes: 0 %
Lymphocytes Absolute: 2.9 10*3/uL (ref 0.7–3.1)
Lymphs: 35 %
MCH: 27.9 pg (ref 26.6–33.0)
MCHC: 33.8 g/dL (ref 31.5–35.7)
MCV: 83 fL (ref 79–97)
Monocytes Absolute: 0.4 10*3/uL (ref 0.1–0.9)
Monocytes: 5 %
Neutrophils Absolute: 4.8 10*3/uL (ref 1.4–7.0)
Neutrophils: 57 %
Platelets: 319 10*3/uL (ref 150–450)
RBC: 4.76 x10E6/uL (ref 3.77–5.28)
RDW: 16.4 % — ABNORMAL HIGH (ref 11.7–15.4)
WBC: 8.3 10*3/uL (ref 3.4–10.8)

## 2020-05-18 LAB — CARDIOVASCULAR RISK ASSESSMENT

## 2020-05-18 LAB — LIPID PANEL
Chol/HDL Ratio: 3.2 ratio (ref 0.0–4.4)
Cholesterol, Total: 182 mg/dL (ref 100–199)
HDL: 57 mg/dL (ref >39)
LDL Chol Calc (NIH): 114 mg/dL — ABNORMAL HIGH (ref 0–99)
Triglycerides: 58 mg/dL (ref 0–149)
VLDL Cholesterol Cal: 11 mg/dL (ref 5–40)

## 2020-05-18 LAB — HEMOGLOBIN A1C
Est. average glucose Bld gHb Est-mCnc: 169 mg/dL
Hgb A1c MFr Bld: 7.5 % — ABNORMAL HIGH (ref 4.8–5.6)

## 2020-05-18 NOTE — Progress Notes (Signed)
Cbc normal, kidney and liver tests normal, A1c 7.5 stable, LDL cholesterol 114  lp

## 2020-05-20 LAB — URINE CULTURE

## 2020-05-20 NOTE — Progress Notes (Signed)
Culture shows klebsiella pneumonia sensitive to cipro lp

## 2020-05-20 NOTE — Progress Notes (Signed)
Urine culture shows Klebsiella pneumonia- sensitive to cipro lp

## 2020-05-23 ENCOUNTER — Telehealth: Payer: Self-pay

## 2020-05-23 ENCOUNTER — Other Ambulatory Visit: Payer: Self-pay | Admitting: Legal Medicine

## 2020-05-23 MED ORDER — FLUCONAZOLE 150 MG PO TABS: 150.0000 mg | ORAL_TABLET | Freq: Once | ORAL | 0 refills | Status: AC

## 2020-05-23 NOTE — Telephone Encounter (Signed)
Patient called and states she has completed her Cipro for UTI and now she is needing medication for a yeast infection. Patient states she is itching vaginally, in her throat, and her ears. Please advise.

## 2020-05-24 ENCOUNTER — Other Ambulatory Visit: Payer: Self-pay | Admitting: Legal Medicine

## 2020-05-24 NOTE — Patient Instructions (Addendum)
Visit Information  Goals Addressed            This Visit's Progress   . Pharmacy Care Plan       CARE PLAN ENTRY (see longitudinal plan of care for additional care plan information)  Current Barriers:  . Chronic Disease Management support, education, and care coordination needs related to Hypertension and Diabetes   Hypertension BP Readings from Last 3 Encounters:  05/17/20 120/88  01/15/20 (!) 126/60  09/16/19 128/72   . Pharmacist Clinical Goal(s): o Over the next 90 days, patient will work with PharmD and providers to maintain BP goal <130/80 . Current regimen:  o Diet and Exercise . Interventions: o Keep up the good work with returning to exercise.  o Discussed portion control and weight loss benefites.  . Patient self care activities - Over the next 90 days, patient will: o Ensure daily salt intake < 2300 mg/day  Diabetes Lab Results  Component Value Date/Time   HGBA1C 7.5 (H) 05/17/2020 09:21 AM   HGBA1C 9.3 (H) 09/16/2019 08:24 AM   . Pharmacist Clinical Goal(s): o Over the next 90 days, patient will work with PharmD and providers to achieve A1c goal <7% . Current regimen:  o Novolog 8-10 units sliding scale as needed - not having to use currently o Toujeo 300 units/ml 23 units twice daily o Trulicity 0.75 mg weekly . Interventions: o Discussed patient's blood work and visit.  o Reviewed improved blood sugar numbers.  o Patient assistance applications for Toujeo, Trulicity and [employer] for 2022.  Marland Kitchen Patient self care activities - Over the next 90 days, patient will: o Check blood sugar 3-4 times daily, document, and provide at future appointments o Contact provider with any episodes of hypoglycemia  Medication management . Pharmacist Clinical Goal(s): o Over the next 90 days, patient will work with PharmD and providers to maintain optimal medication adherence . Current pharmacy: [facility] . Interventions o Comprehensive medication review  performed. o Continue current medication management strategy . Patient self care activities - Over the next 90 days, patient will: o Focus on medication adherence by pill box o Take medications as prescribed o Report any questions or concerns to PharmD and/or provider(s)  Please see past updates related to this goal by clicking on the "Past Updates" button in the selected goal         The patient verbalized understanding of instructions, educational materials, and care plan provided today and declined offer to receive copy of patient instructions, educational materials, and care plan.   Telephone follow up appointment with pharmacy team member scheduled for: January 2022  Juliane Lack, PharmD, [facility] Clinical Pharmacist [facility] [phone] (office) [phone] (mobile)   DASH Eating Plan DASH stands for "Dietary Approaches to Stop Hypertension." The DASH eating plan is a healthy eating plan that has been shown to reduce high blood pressure (hypertension). It may also reduce your risk for type 2 diabetes, heart disease, and stroke. The DASH eating plan may also help with weight loss. What are tips for following this plan?  General guidelines  Avoid eating more than 2,300 mg (milligrams) of salt (sodium) a day. If you have hypertension, you may need to reduce your sodium intake to 1,500 mg a day.  Limit alcohol intake to no more than 1 drink a day for nonpregnant women and 2 drinks a day for men. One drink equals 12 oz of beer, 5 oz of wine, or 1 oz of hard liquor.  Work  with your health care provider to maintain a healthy body weight or to lose weight. Ask what an ideal weight is for you.  Get at least 30 minutes of exercise that causes your heart to beat faster (aerobic exercise) most days of the week. Activities may include walking, swimming, or biking.  Work with your health care provider or diet and nutrition specialist (dietitian) to adjust your eating  plan to your individual calorie needs. Reading food labels   Check food labels for the amount of sodium per serving. Choose foods with less than 5 percent of the Daily Value of sodium. Generally, foods with less than 300 mg of sodium per serving fit into this eating plan.  To find whole grains, look for the word "whole" as the first word in the ingredient list. Shopping  Buy products labeled as "low-sodium" or "no salt added."  Buy fresh foods. Avoid canned foods and premade or frozen meals. Cooking  Avoid adding salt when cooking. Use salt-free seasonings or herbs instead of table salt or sea salt. Check with your health care provider or pharmacist before using salt substitutes.  Do not fry foods. Cook foods using healthy methods such as baking, boiling, grilling, and broiling instead.  Cook with heart-healthy oils, such as olive, canola, soybean, or sunflower oil. Meal planning  Eat a balanced diet that includes: ? 5 or more servings of fruits and vegetables each day. At each meal, try to fill half of your plate with fruits and vegetables. ? Up to 6-8 servings of whole grains each day. ? Less than 6 oz of lean meat, poultry, or fish each day. A 3-oz serving of meat is about the same size as a deck of cards. One egg equals 1 oz. ? 2 servings of low-fat dairy each day. ? A serving of nuts, seeds, or beans 5 times each week. ? Heart-healthy fats. Healthy fats called Omega-3 fatty acids are found in foods such as flaxseeds and coldwater fish, like sardines, salmon, and mackerel.  Limit how much you eat of the following: ? Canned or prepackaged foods. ? Food that is high in trans fat, such as fried foods. ? Food that is high in saturated fat, such as fatty meat. ? Sweets, desserts, sugary drinks, and other foods with added sugar. ? Full-fat dairy products.  Do not salt foods before eating.  Try to eat at least 2 vegetarian meals each week.  Eat more home-cooked food and less  restaurant, buffet, and fast food.  When eating at a restaurant, ask that your food be prepared with less salt or no salt, if possible. What foods are recommended? The items listed may not be a complete list. Talk with your dietitian about what dietary choices are best for you. Grains Whole-grain or whole-wheat bread. Whole-grain or whole-wheat pasta. Aydden Cumpian rice. Orpah Cobb. Bulgur. Whole-grain and low-sodium cereals. Pita bread. Low-fat, low-sodium crackers. Whole-wheat flour tortillas. Vegetables Fresh or frozen vegetables (raw, steamed, roasted, or grilled). Low-sodium or reduced-sodium tomato and vegetable juice. Low-sodium or reduced-sodium tomato sauce and tomato paste. Low-sodium or reduced-sodium canned vegetables. Fruits All fresh, dried, or frozen fruit. Canned fruit in natural juice (without added sugar). Meat and other protein foods Skinless chicken or [place]. Ground chicken or [place]. Pork with fat trimmed off. Fish and seafood. Egg whites. Dried beans, peas, or lentils. Unsalted nuts, nut butters, and seeds. Unsalted canned beans. Lean cuts of beef with fat trimmed off. Low-sodium, lean deli meat. Dairy Low-fat (1%) or fat-free (skim) milk.  Fat-free, low-fat, or reduced-fat cheeses. Nonfat, low-sodium ricotta or cottage cheese. Low-fat or nonfat yogurt. Low-fat, low-sodium cheese. Fats and oils Soft margarine without trans fats. Vegetable oil. Low-fat, reduced-fat, or light mayonnaise and salad dressings (reduced-sodium). Canola, safflower, olive, soybean, and sunflower oils. Avocado. Seasoning and other foods Herbs. Spices. Seasoning mixes without salt. Unsalted popcorn and pretzels. Fat-free sweets. What foods are not recommended? The items listed may not be a complete list. Talk with your dietitian about what dietary choices are best for you. Grains Baked goods made with fat, such as croissants, muffins, or some breads. Dry pasta or rice meal packs. Vegetables Creamed or  fried vegetables. Vegetables in a cheese sauce. Regular canned vegetables (not low-sodium or reduced-sodium). Regular canned tomato sauce and paste (not low-sodium or reduced-sodium). Regular tomato and vegetable juice (not low-sodium or reduced-sodium). Rosita Fire. Olives. Fruits Canned fruit in a light or heavy syrup. Fried fruit. Fruit in cream or butter sauce. Meat and other protein foods Fatty cuts of meat. Ribs. Fried meat. Tomasa Blase. Sausage. Bologna and other processed lunch meats. Salami. Fatback. Hotdogs. Bratwurst. Salted nuts and seeds. Canned beans with added salt. Canned or smoked fish. Whole eggs or egg yolks. Chicken or [place] with skin. Dairy Whole or 2% milk, cream, and half-and-half. Whole or full-fat cream cheese. Whole-fat or sweetened yogurt. Full-fat cheese. Nondairy creamers. Whipped toppings. Processed cheese and cheese spreads. Fats and oils Butter. Stick margarine. Lard. Shortening. Ghee. Bacon fat. Tropical oils, such as coconut, palm kernel, or palm oil. Seasoning and other foods Salted popcorn and pretzels. Onion salt, garlic salt, seasoned salt, table salt, and sea salt. Worcestershire sauce. Tartar sauce. Barbecue sauce. Teriyaki sauce. Soy sauce, including reduced-sodium. Steak sauce. Canned and packaged gravies. Fish sauce. Oyster sauce. Cocktail sauce. Horseradish that you find on the shelf. Ketchup. Mustard. Meat flavorings and tenderizers. Bouillon cubes. Hot sauce and Tabasco sauce. Premade or packaged marinades. Premade or packaged taco seasonings. Relishes. Regular salad dressings. Where to find more information:  National Heart, Lung, and Blood Institute: PopSteam.is  American Heart Association: www.heart.org Summary  The DASH eating plan is a healthy eating plan that has been shown to reduce high blood pressure (hypertension). It may also reduce your risk for type 2 diabetes, heart disease, and stroke.  With the DASH eating plan, you should limit salt  (sodium) intake to 2,300 mg a day. If you have hypertension, you may need to reduce your sodium intake to 1,500 mg a day.  When on the DASH eating plan, aim to eat more fresh fruits and vegetables, whole grains, lean proteins, low-fat dairy, and heart-healthy fats.  Work with your health care provider or diet and nutrition specialist (dietitian) to adjust your eating plan to your individual calorie needs. This information is not intended to replace advice given to you by your health care provider. Make sure you discuss any questions you have with your health care provider. Document Revised: 05/24/2017 Document Reviewed: 06/04/2016 Elsevier Patient Education  2020 [employer].

## 2020-06-01 ENCOUNTER — Telehealth (INDEPENDENT_AMBULATORY_CARE_PROVIDER_SITE_OTHER): Payer: Medicare Other | Admitting: Legal Medicine

## 2020-06-01 ENCOUNTER — Encounter: Payer: Self-pay | Admitting: Legal Medicine

## 2020-06-01 VITALS — BP 128/80 | HR 80 | Temp 98.6°F | Ht 66.3 in | Wt 194.0 lb

## 2020-06-01 DIAGNOSIS — J329 Chronic sinusitis, unspecified: Secondary | ICD-10-CM | POA: Insufficient documentation

## 2020-06-01 DIAGNOSIS — J018 Other acute sinusitis: Secondary | ICD-10-CM

## 2020-06-01 DIAGNOSIS — I10 Essential (primary) hypertension: Secondary | ICD-10-CM | POA: Diagnosis not present

## 2020-06-01 MED ORDER — AZITHROMYCIN 250 MG PO TABS: ORAL_TABLET | ORAL | 0 refills | Status: DC

## 2020-06-01 MED ORDER — FLUCONAZOLE 150 MG PO TABS: 150.0000 mg | ORAL_TABLET | Freq: Every day | ORAL | 0 refills | Status: DC

## 2020-06-01 NOTE — Progress Notes (Signed)
Virtual Visit via Telephone Note   This visit type was conducted due to national recommendations for restrictions regarding the COVID-19 Pandemic (e.g. social distancing) in an effort to limit this patient's exposure and mitigate transmission in our community.  Due to her co-morbid illnesses, this patient is at least at moderate risk for complications without adequate follow up.  This format is felt to be most appropriate for this patient at this time.  The patient did not have access to video technology/had technical difficulties with video requiring transitioning to audio format only (telephone).  All issues noted in this document were discussed and addressed.  No physical exam could be performed with this format.  Patient verbally consented to a telehealth visit.   Date:  06/01/2020   ID:  Suzanne Hardin, DOB 08-Jun-1956, MRN [id number]  Patient Location: Home Provider Location: Office/Clinic  PCP:  Abigail Miyamoto, MD   Evaluation Performed:  New Patient Evaluation  Chief Complaint:  Sore throat, sinus congestion  History of Present Illness:    Suzanne Hardin is a 64 y.o. female with Sore throat, sinus congestion for one week, some cough with yellow sputum.  No fever or chills  The patient does not have symptoms concerning for COVID-19 infection (fever, chills, cough, or new shortness of breath).    Past Medical History:  Diagnosis Date  . 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 Cystitis   . Essential hypertension 09/16/2019  . Hypertension   . Moderate persistent asthma 09/16/2019  . Sciatica of right side   . Type 2 diabetes mellitus with other specified complication (HCC) 09/16/2019    Past Surgical History:  Procedure Laterality Date  . APPENDECTOMY  2000  . CATARACT EXTRACTION, BILATERAL    . CHOLECYSTECTOMY  2000  . TONSILLECTOMY AND ADENOIDECTOMY      Family History  Problem Relation Age of Onset  . Ulcers Father   . Peripheral Artery Disease Father      Social History   Socioeconomic History  . Marital status: Divorced    Spouse name: Not on file  . Number of children: 2  . Years of education: Not on file  . Highest education level: Not on file  Occupational History  . Occupation: Disabled  Tobacco Use  . Smoking status: Never Smoker  . Smokeless tobacco: Never Used  Vaping Use  . Vaping Use: Never used  Substance and Sexual Activity  . Alcohol use: Never  . Drug use: Never  . Sexual activity: Yes    Partners: Male  Other Topics Concern  . Not on file  Social History Narrative  . Not on file   Social Determinants of Health   Financial Resource Strain:   . Difficulty of Paying Living Expenses: Not on file  Food Insecurity:   . Worried About Programme researcher, broadcasting/film/video in the Last Year: Not on file  . Ran Out of Food in the Last Year: Not on file  Transportation Needs:   . Lack of Transportation (Medical): Not on file  . Lack of Transportation (Non-Medical): Not on file  Physical Activity:   . Days of Exercise per Week: Not on file  . Minutes of Exercise per Session: Not on file  Stress:   . Feeling of Stress : Not on file  Social Connections:   . Frequency of Communication with Friends and Family: Not on file  . Frequency of Social Gatherings with Friends and Family: Not on file  . Attends Religious Services: Not on  file  . Active Member of Clubs or Organizations: Not on file  . Attends Banker Meetings: Not on file  . Marital Status: Not on file  Intimate Partner Violence:   . Fear of Current or Ex-Partner: Not on file  . Emotionally Abused: Not on file  . Physically Abused: Not on file  . Sexually Abused: Not on file    Outpatient Medications Prior to Visit  Medication Sig Dispense Refill  . albuterol (VENTOLIN HFA) 108 (90 Base) MCG/ACT inhaler Inhale 2 puffs into the lungs every 6 (six) hours as needed.     . Alpha-Lipoic Acid 300 MG CAPS Take 300 mg by mouth daily.    . baclofen (LIORESAL) 10  MG tablet Take 10 mg by mouth 3 (three) times daily.    . Cholecalciferol (VITAMIN D) 50 MCG (2000 UT) tablet Take 2,000 Units by mouth daily.    . Cyanocobalamin 1000 MCG TBCR Take 1 tablet by mouth daily.    . D-Mannose POWD Take by mouth.    . Dulaglutide (TRULICITY) 0.75 MG/0.5ML SOPN Inject 0.75 mg into the skin once a week. 2 mL 6  . glucose blood (CONTOUR NEXT TEST) test strip 1 each by Other route 2 (two) times daily. Use as instructed 100 each 2  . glucose blood test strip 1 each by Other route 3 (three) times daily as needed. Use as instructed    . hydrOXYzine (ATARAX) 10 MG/5ML syrup Take by mouth.    . hydrOXYzine (VISTARIL) 50 MG capsule Take by mouth.     Marland Kitchen ipratropium-albuterol (DUONEB) 0.5-2.5 (3) MG/3ML SOLN Inhale 3 mLs into the lungs every 6 (six) hours as needed.     . Lancets MISC by Does not apply route.    Marland Kitchen NOVOLOG FLEXPEN 100 UNIT/ML FlexPen Inject into the skin daily. Patient uses slidding scale. Maximum 30 units uses only blood sugar >250mg /dl.  Using 8-10 units when sugars are high lately.    . pyridOXINE (VITAMIN B-6) 50 MG tablet Take 25 mg by mouth daily. Takes the p5pb6 version 25 mg daily    . Sodium Chloride-Sodium Bicarb (NETI POT SINUS WASH NA) Place into the nose as needed.    [name] SOLOSTAR 300 UNIT/ML Solostar Pen Inject 23 Units into the skin daily. (Patient taking differently: Inject 23 Units into the skin in the morning and at bedtime. ) 6 pen 6  . TRIAMCINOLONE ACETONIDE, TOP, 0.05 % OINT Apply topically daily as needed. Uses as needed for rash, bug bites or on gums before dentist.    . Zinc 25 MG TABS Take by mouth.    . zinc gluconate 50 MG tablet Take 50 mg by mouth daily. Takes Monday-Friday.    . ciprofloxacin (CIPRO) 500 MG tablet Take 1 tablet (500 mg total) by mouth 2 (two) times daily. 20 tablet 0   No facility-administered medications prior to visit.    Allergies:   Dextromethorphan-guaifenesin, Hydrocodone, Lidocaine, Other, Sodium  laureth sulfate, Sulfa antibiotics, Sulfites, Red dye, Articaine-epinephrine, Cymbalta [duloxetine hcl], Darvon [propoxyphene], Duloxetine, Gentamicin sulfate, Hctz [hydrochlorothiazide], Humalog [insulin lispro], Keflex [cephalexin], Lasix [furosemide], Lisinopril-hydrochlorothiazide, Loratadine, Macrodantin [nitrofurantoin], Metformin, Mucinex [guaifenesin er], Peanut-containing drug products, Penicillins, Pioglitazone, Tessalon perles [benzonatate], Travatan [travoprost], Tylenol [acetaminophen], Propofol, and Sulfamethoxazole-trimethoprim   Social History   Tobacco Use  . Smoking status: Never Smoker  . Smokeless tobacco: Never Used  Vaping Use  . Vaping Use: Never used  Substance Use Topics  . Alcohol use: Never  . Drug use: Never  Review of Systems  Constitutional: Negative for chills, fever and malaise/fatigue.  HENT: Positive for congestion, sinus pain and sore throat.   Respiratory: Positive for cough.   Cardiovascular: Negative for chest pain and palpitations.  Genitourinary: Negative for dysuria.  Musculoskeletal: Negative.   Neurological: Negative for dizziness and headaches.  Psychiatric/Behavioral: Negative.      Labs/Other Tests and Data Reviewed:    Recent Labs: 01/15/2020: TSH 1.760 05/17/2020: ALT 16; BUN 11; Creatinine, Ser 0.91; Hemoglobin 13.3; Platelets 319; Potassium 4.1; Sodium 144   Recent Lipid Panel Lab Results  Component Value Date/Time   CHOL 182 05/17/2020 09:21 AM   TRIG 58 05/17/2020 09:21 AM   HDL 57 05/17/2020 09:21 AM   CHOLHDL 3.2 05/17/2020 09:21 AM   LDLCALC 114 (H) 05/17/2020 09:21 AM    Wt Readings from Last 3 Encounters:  06/01/20 194 lb (88 kg)  05/17/20 198 lb (89.8 kg)  01/15/20 200 lb 6.4 oz (90.9 kg)     Objective:    Vital Signs:  BP 128/80   Pulse 80   Temp 98.6 F (37 C)   Ht 5' 6.3" (1.684 m)   Wt 194 lb (88 kg)   SpO2 98%   BMI 31.03 kg/m    Physical Exam reviewed  ASSESSMENT & PLAN:    Diagnoses and  all orders for this visit: Primary hypertension An individual hypertension care plan was established and reinforced today.  The patient's status was assessed using clinical findings on exam and labs or diagnostic tests. The patient's success at meeting treatment goals on disease specific evidence-based guidelines and found to be well controlled. SELF MANAGEMENT: The patient and I together assessed ways to personally work towards obtaining the recommended goals. RECOMMENDATIONS: avoid decongestants found in common cold remedies, decrease consumption of alcohol, perform routine monitoring of BP with home BP cuff, exercise, reduction of dietary salt, take medicines as prescribed, try not to miss doses and quit smoking.  Regular exercise and maintaining a healthy weight is needed.  Stress reduction may help. A CLINICAL SUMMARY including written plan identify barriers to care unique to individual due to social or financial issues.  We attempt to mutually creat solutions for individual and family understanding.  Acute non-recurrent sinusitis of other sinus -     azithromycin (ZITHROMAX) 250 MG tablet; 2 tablets on day 1, then 1 tablet daily on days 2-6. -     fluconazole (DIFLUCAN) 150 MG tablet; Take 1 tablet (150 mg total) by mouth daily. Patient has symptoms of sinusitis and gets 2nd vagina infections.       COVID-19 Education: The signs and symptoms of COVID-19 were discussed with the patient and how to seek care for testing (follow up with PCP or arrange E-visit). The importance of social distancing was discussed today.   I spent 20 minutes dedicated to the care of this patient on the date of this encounter to include face-to-face time with the patient, as well as:   Follow Up:  In Person prn  Signed,  Brent Bulla, MD  06/01/2020 9:46 AM    [facility]

## 2020-06-09 ENCOUNTER — Encounter: Payer: Self-pay | Admitting: Legal Medicine

## 2020-06-10 ENCOUNTER — Telehealth: Payer: Self-pay

## 2020-06-10 NOTE — Chronic Care Management (AMB) (Addendum)
Chronic Care Management Pharmacy Assistant   Name: Suzanne Hardin  MRN: [id number] DOB: Mar 12, 1956  Reason for Encounter: Medication Review    PCP : Abigail Miyamoto, MD  Allergies:   Allergies  Allergen Reactions   Dextromethorphan-Guaifenesin Itching   Hydrocodone Other (See Comments)    Inflammatory Inflammatory Other reaction(s): Other (See Comments) Inflammatory Inflammatory    Lidocaine Swelling   Other Rash, Other (See Comments) and Swelling    Neurological damage fragances fragances  Neurological damage Fragances; YELLOW NUMBER 5;  Yellow #5 Yellow #5 Yellow #5 Yellow #5  High eye pressure High eye pressure High eye pressure    Sodium Laureth Sulfate Dermatitis, Hives, Itching, Other (See Comments), Rash and Swelling   Sulfa Antibiotics Hives   Sulfites Shortness Of Breath    Other reaction(s): Shortness of Breath Other reaction(s): Shortness of Breath    Red Dye    Articaine-Epinephrine Swelling   Cymbalta [Duloxetine Hcl]    Darvon [Propoxyphene]    Duloxetine    Gentamicin Sulfate    Hctz [Hydrochlorothiazide]    Humalog [Insulin Lispro]    Keflex [Cephalexin]    Lasix [Furosemide]    Lisinopril-Hydrochlorothiazide    Loratadine    Macrodantin [Nitrofurantoin]    Metformin    Mucinex [Guaifenesin Er]    Peanut-Containing Drug Products    Penicillins    Pioglitazone    Tessalon Perles [Benzonatate]    Travatan [Travoprost]    Tylenol [Acetaminophen]    Propofol Other (See Comments) and Nausea And Vomiting   Sulfamethoxazole-Trimethoprim Rash    Medications: Outpatient Encounter Medications as of 06/10/2020  Medication Sig   albuterol (VENTOLIN HFA) 108 (90 Base) MCG/ACT inhaler Inhale 2 puffs into the lungs every 6 (six) hours as needed.    Alpha-Lipoic Acid 300 MG CAPS Take 300 mg by mouth daily.   azithromycin (ZITHROMAX) 250 MG tablet 2 tablets on day 1, then 1 tablet daily on days 2-6.   baclofen (LIORESAL) 10 MG  tablet Take 10 mg by mouth 3 (three) times daily.   Cholecalciferol (VITAMIN D) 50 MCG (2000 UT) tablet Take 2,000 Units by mouth daily.   Cyanocobalamin 1000 MCG TBCR Take 1 tablet by mouth daily.   D-Mannose POWD Take by mouth.   Dulaglutide (TRULICITY) 0.75 MG/0.5ML SOPN Inject 0.75 mg into the skin once a week.   fluconazole (DIFLUCAN) 150 MG tablet Take 1 tablet (150 mg total) by mouth daily.   glucose blood (CONTOUR NEXT TEST) test strip 1 each by Other route 2 (two) times daily. Use as instructed   glucose blood test strip 1 each by Other route 3 (three) times daily as needed. Use as instructed   hydrOXYzine (ATARAX) 10 MG/5ML syrup Take by mouth.   hydrOXYzine (VISTARIL) 50 MG capsule Take by mouth.    ipratropium-albuterol (DUONEB) 0.5-2.5 (3) MG/3ML SOLN Inhale 3 mLs into the lungs every 6 (six) hours as needed.    Lancets MISC by Does not apply route.   NOVOLOG FLEXPEN 100 UNIT/ML FlexPen Inject into the skin daily. Patient uses slidding scale. Maximum 30 units uses only blood sugar >250mg /dl.  Using 8-10 units when sugars are high lately.   pyridOXINE (VITAMIN B-6) 50 MG tablet Take 25 mg by mouth daily. Takes the p5pb6 version 25 mg daily   Sodium Chloride-Sodium Bicarb (NETI POT SINUS WASH NA) Place into the nose as needed.   TOUJEO SOLOSTAR 300 UNIT/ML Solostar Pen Inject 23 Units into the skin daily. (Patient taking differently: Inject  23 Units into the skin in the morning and at bedtime. )   TRIAMCINOLONE ACETONIDE, TOP, 0.05 % OINT Apply topically daily as needed. Uses as needed for rash, bug bites or on gums before dentist.   Zinc 25 MG TABS Take by mouth.   zinc gluconate 50 MG tablet Take 50 mg by mouth daily. Takes Monday-Friday.   No facility-administered encounter medications on file as of 06/10/2020.    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 Waiting for provider and patient's signature.  Called patient to have her come to PCP office to sign application and to bring proof of income. Per patient she will be in on 06/14/2020 to sign application and bring proof of income. She also has a patient assistance form for [employer] to sign.  Levada Dy Manson Passey, CPP notified  Jon Gills, [facility] Clinical Pharmacist Assistant [phone]

## 2020-06-21 ENCOUNTER — Telehealth: Payer: Medicare Other | Admitting: Family Medicine

## 2020-06-21 ENCOUNTER — Other Ambulatory Visit: Payer: Self-pay | Admitting: Legal Medicine

## 2020-06-21 ENCOUNTER — Telehealth (INDEPENDENT_AMBULATORY_CARE_PROVIDER_SITE_OTHER): Payer: Medicare Other | Admitting: Family Medicine

## 2020-06-21 DIAGNOSIS — R509 Fever, unspecified: Secondary | ICD-10-CM | POA: Diagnosis not present

## 2020-06-21 DIAGNOSIS — R3915 Urgency of urination: Secondary | ICD-10-CM | POA: Insufficient documentation

## 2020-06-21 DIAGNOSIS — R8290 Unspecified abnormal findings in urine: Secondary | ICD-10-CM | POA: Insufficient documentation

## 2020-06-21 DIAGNOSIS — J018 Other acute sinusitis: Secondary | ICD-10-CM

## 2020-06-21 LAB — POCT URINALYSIS DIP (CLINITEK)
Bilirubin, UA: NEGATIVE
Glucose, UA: NEGATIVE mg/dL
Ketones, POC UA: NEGATIVE mg/dL
Nitrite, UA: POSITIVE — AB
POC PROTEIN,UA: 30 — AB
Spec Grav, UA: >=1.03 — AB (ref 1.010–1.025)
Urobilinogen, UA: 0.2 E.U./dL
pH, UA: 6 (ref 5.0–8.0)

## 2020-06-21 LAB — POC COVID19 BINAXNOW: SARS Coronavirus 2 Ag: POSITIVE — AB

## 2020-06-21 LAB — POCT INFLUENZA A/B
Influenza A, POC: NEGATIVE
Influenza B, POC: NEGATIVE

## 2020-06-21 MED ORDER — CIPROFLOXACIN HCL 250 MG PO TABS: 250.0000 mg | ORAL_TABLET | Freq: Two times a day (BID) | ORAL | 0 refills | Status: DC

## 2020-06-21 MED ORDER — FLUCONAZOLE 150 MG PO TABS: 150.0000 mg | ORAL_TABLET | Freq: Once | ORAL | 0 refills | Status: AC
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
## 2020-06-22 ENCOUNTER — Telehealth: Payer: Self-pay

## 2020-06-22 NOTE — Telephone Encounter (Signed)
Pt left VM stating she has spoken with [facility] and she found no risks with antibodies from them.  She also sent a message to Di Kindle, Pharmacist who sent message to me. Copied below: "I left a message with Dr Lance Coon nurse. I spoke with a pharmacist at [facility] today. Their brand does not have any obvious ingredients that would present a problem. The IV solutions are normal saline or D5W so that shouldn't be a problem either."  Please advise.

## 2020-06-22 NOTE — Telephone Encounter (Signed)
Please forward to [facility] to evaluate for possible monoclonal antibody infusion

## 2020-06-25 LAB — URINE CULTURE

## 2020-06-27 ENCOUNTER — Other Ambulatory Visit: Payer: Self-pay

## 2020-06-28 ENCOUNTER — Telehealth: Payer: Self-pay

## 2020-06-28 NOTE — Telephone Encounter (Signed)
Pt returned call with pharmacy name. Pt states [facility] in [place] stated they could compound.   Called pharmacy. [phone]. Gave verbal for Tetracycline 500 mg capsule BID for 5 days. 10 capsules given no refills. No Dye and allergen free.   Called pt back. Pt VU that pharmacy will be filling medication.

## 2020-06-29 ENCOUNTER — Other Ambulatory Visit: Payer: Self-pay | Admitting: Family Medicine

## 2020-06-30 ENCOUNTER — Telehealth: Payer: Self-pay

## 2020-06-30 NOTE — Telephone Encounter (Signed)
Called pt about Insurance Denial for authorization of compound drug. Pt stated she would be appealing the denial. Also would be sending something to [facility] and requesting signature back. Also will have a face-to-face visit in [place] for them to review her records.

## 2020-07-04 DIAGNOSIS — G243 Spasmodic torticollis: Secondary | ICD-10-CM | POA: Diagnosis not present

## 2020-07-06 ENCOUNTER — Other Ambulatory Visit: Payer: Self-pay | Admitting: Legal Medicine

## 2020-07-27 DIAGNOSIS — H40053 Ocular hypertension, bilateral: Secondary | ICD-10-CM | POA: Diagnosis not present

## 2020-08-04 LAB — IFOBT (OCCULT BLOOD): IFOBT: NEGATIVE

## 2020-08-08 ENCOUNTER — Other Ambulatory Visit: Payer: Self-pay

## 2020-08-08 ENCOUNTER — Ambulatory Visit (INDEPENDENT_AMBULATORY_CARE_PROVIDER_SITE_OTHER): Payer: Medicare Other | Admitting: Legal Medicine

## 2020-08-08 ENCOUNTER — Encounter: Payer: Self-pay | Admitting: Legal Medicine

## 2020-08-08 VITALS — BP 144/80 | HR 83 | Temp 97.4°F | Resp 16 | Ht 66.0 in | Wt 200.0 lb

## 2020-08-08 DIAGNOSIS — E1169 Type 2 diabetes mellitus with other specified complication: Secondary | ICD-10-CM | POA: Diagnosis not present

## 2020-08-08 DIAGNOSIS — Z794 Long term (current) use of insulin: Secondary | ICD-10-CM

## 2020-08-08 DIAGNOSIS — N3001 Acute cystitis with hematuria: Secondary | ICD-10-CM | POA: Diagnosis not present

## 2020-08-08 DIAGNOSIS — N3090 Cystitis, unspecified without hematuria: Secondary | ICD-10-CM | POA: Insufficient documentation

## 2020-08-08 LAB — POCT URINALYSIS DIP (CLINITEK)
Bilirubin, UA: NEGATIVE
Glucose, UA: NEGATIVE mg/dL
Ketones, POC UA: NEGATIVE mg/dL
Nitrite, UA: POSITIVE — AB
POC PROTEIN,UA: 30 — AB
Spec Grav, UA: >=1.03 — AB (ref 1.010–1.025)
Urobilinogen, UA: 0.2 E.U./dL
pH, UA: 5.5 (ref 5.0–8.0)

## 2020-08-08 MED ORDER — FLUCONAZOLE 150 MG PO TABS: 150.0000 mg | ORAL_TABLET | Freq: Once | ORAL | 0 refills | Status: AC

## 2020-08-08 NOTE — Progress Notes (Signed)
Subjective:  Patient ID: Suzanne Hardin, female    DOB: Jun 15, 1956  Age: 65 y.o. MRN: [id number]  Chief Complaint  Patient presents with  . Urinary Tract Infection    HPI: recurrent UTI .  Symptoms started Friday, she is on cipro.  But no difference in symptoms of dysuria and frequency.  She is having back pain.   Current Outpatient Medications on File Prior to Visit  Medication Sig Dispense Refill  . albuterol (VENTOLIN HFA) 108 (90 Base) MCG/ACT inhaler Inhale 2 puffs into the lungs every 6 (six) hours as needed.     . Alpha-Lipoic Acid 300 MG CAPS Take 300 mg by mouth daily.    . baclofen (LIORESAL) 10 MG tablet Take 10 mg by mouth 3 (three) times daily.    . Cholecalciferol (VITAMIN D) 50 MCG (2000 UT) tablet Take 2,000 Units by mouth daily.    . CONTOUR NEXT TEST test strip USE 1 STRIP TO CHECK GLUCOSE TWICE DAILY 100 each 6  . Cyanocobalamin 1000 MCG TBCR Take 1 tablet by mouth daily.    . D-Mannose POWD Take by mouth.    . Dulaglutide (TRULICITY) 0.75 MG/0.5ML SOPN Inject 0.75 mg into the skin once a week. 2 mL 6  . fluconazole (DIFLUCAN) 150 MG tablet TAKE 1 TABLET BY MOUTH EVERY DAY 7 tablet 0  . glucose blood test strip 1 each by Other route 3 (three) times daily as needed. Use as instructed    . hydrOXYzine (ATARAX) 10 MG/5ML syrup Take by mouth.    . hydrOXYzine (VISTARIL) 50 MG capsule Take by mouth.     Marland Kitchen ipratropium-albuterol (DUONEB) 0.5-2.5 (3) MG/3ML SOLN Inhale 3 mLs into the lungs every 6 (six) hours as needed.     . Lancets MISC by Does not apply route.    Marland Kitchen NOVOLOG FLEXPEN 100 UNIT/ML FlexPen Inject into the skin daily. Patient uses slidding scale. Maximum 30 units uses only blood sugar >250mg /dl.  Using 8-10 units when sugars are high lately.    . pyridOXINE (VITAMIN B-6) 50 MG tablet Take 25 mg by mouth daily. Takes the p5pb6 version 25 mg daily    . Sodium Chloride-Sodium Bicarb (NETI POT SINUS WASH NA) Place into the nose as needed.    [name] SOLOSTAR  300 UNIT/ML Solostar Pen Inject 23 Units into the skin daily. (Patient taking differently: Inject 23 Units into the skin in the morning and at bedtime.) 6 pen 6  . TRIAMCINOLONE ACETONIDE, TOP, 0.05 % OINT Apply topically daily as needed. Uses as needed for rash, bug bites or on gums before dentist.    . Zinc 25 MG TABS Take by mouth.    . zinc gluconate 50 MG tablet Take 50 mg by mouth daily. Takes Monday-Friday.     No current facility-administered medications on file prior to visit.   Past Medical History:  Diagnosis Date  . 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 Cystitis   . Essential hypertension 09/16/2019  . Hypertension   . Moderate persistent asthma 09/16/2019  . Sciatica of right side   . Type 2 diabetes mellitus with other specified complication (HCC) 09/16/2019   Past Surgical History:  Procedure Laterality Date  . APPENDECTOMY  2000  . CATARACT EXTRACTION, BILATERAL    . CHOLECYSTECTOMY  2000  . TONSILLECTOMY AND ADENOIDECTOMY      Family History  Problem Relation Age of Onset  . Ulcers Father   . Peripheral Artery Disease Father    Social History  Socioeconomic History  . Marital status: Divorced    Spouse name: Not on file  . Number of children: 2  . Years of education: Not on file  . Highest education level: Not on file  Occupational History  . Occupation: Disabled  Tobacco Use  . Smoking status: Never Smoker  . Smokeless tobacco: Never Used  Vaping Use  . Vaping Use: Never used  Substance and Sexual Activity  . Alcohol use: Never  . Drug use: Never  . Sexual activity: Yes    Partners: Male  Other Topics Concern  . Not on file  Social History Narrative  . Not on file   Social Determinants of Health   Financial Resource Strain: Not on file  Food Insecurity: Not on file  Transportation Needs: Not on file  Physical Activity: Not on file  Stress: Not on file  Social Connections: Not on file    Review of Systems  Constitutional: Negative for activity  change and unexpected weight change.  HENT: Negative for congestion and sinus pain.   Eyes: Negative for visual disturbance.  Respiratory: Negative.  Negative for shortness of breath.   Cardiovascular: Negative for chest pain, palpitations and leg swelling.  Gastrointestinal: Negative.  Negative for abdominal distention and abdominal pain.  Endocrine: Negative for polyuria.  Genitourinary: Positive for dysuria and frequency.  Musculoskeletal: Negative.   Skin: Negative.   Neurological: Negative.   Psychiatric/Behavioral: Negative.      Objective:  BP (!) 144/80   Pulse 83   Temp (!) 97.4 F (36.3 C)   Resp 16   Ht 5\' 6"  (1.676 m)   Wt 200 lb (90.7 kg)   SpO2 99%   BMI 32.28 kg/m   BP/Weight 08/08/2020 06/01/2020 05/17/2020  Systolic BP 144 128 120  Diastolic BP 80 80 88  Wt. (Lbs) 200 194 198  BMI 32.28 31.03 31.67    Physical Exam Vitals reviewed.  Constitutional:      Appearance: Normal appearance.  HENT:     Right Ear: Tympanic membrane normal.     Left Ear: Tympanic membrane normal.     Mouth/Throat:     Mouth: Mucous membranes are moist.     Pharynx: Oropharynx is clear.  Eyes:     Extraocular Movements: Extraocular movements intact.     Conjunctiva/sclera: Conjunctivae normal.     Pupils: Pupils are equal, round, and reactive to light.  Cardiovascular:     Rate and Rhythm: Normal rate and regular rhythm.     Pulses: Normal pulses.     Heart sounds: Normal heart sounds. No murmur heard. No gallop.   Pulmonary:     Effort: Pulmonary effort is normal.     Breath sounds: Normal breath sounds.  Abdominal:     Tenderness: There is abdominal tenderness (suprapubic area).  Musculoskeletal:     Cervical back: Neck supple.  Skin:    General: Skin is warm.     Capillary Refill: Capillary refill takes less than 2 seconds.  Neurological:     Mental Status: She is alert.       Lab Results  Component Value Date   WBC 8.3 05/17/2020   HGB 13.3 05/17/2020    HCT 39.3 05/17/2020   PLT 319 05/17/2020   GLUCOSE 94 05/17/2020   CHOL 182 05/17/2020   TRIG 58 05/17/2020   HDL 57 05/17/2020   LDLCALC 114 (H) 05/17/2020   ALT 16 05/17/2020   AST 25 05/17/2020   NA 144 05/17/2020  K 4.1 05/17/2020   CL 106 05/17/2020   CREATININE 0.91 05/17/2020   BUN 11 05/17/2020   CO2 25 05/17/2020   TSH 1.760 01/15/2020   HGBA1C 7.5 (H) 05/17/2020   MICROALBUR 80 01/15/2020      Assessment & Plan:   Diagnoses and all orders for this visit: Acute cystitis with hematuria -     POCT URINALYSIS DIP (CLINITEK) -     Urine Culture UTI - we will treat with tetracycline from compounding pharmacy Type 2 diabetes mellitus with other specified complication, with long-term current use of insulin (HCC) An individual care plan for diabetes was established and reinforced today.  The patient's status was assessed using clinical findings on exam, labs and diagnostic testing. Patient success at meeting goals based on disease specific evidence-based guidelines and found to be fair controlled. Medications were assessed and patient's understanding of the medical issues , including barriers were assessed. Recommend adherence to a diabetic diet, a graduated exercise program, HgbA1c level is checked quarterly, and urine microalbumin performed yearly .  Annual mono-filament sensation testing performed. Lower blood pressure and control hyperlipidemia is important. Get annual eye exams and annual flu shots and smoking cessation discussed.  Self management goals were discussed. Other orders -     fluconazole (DIFLUCAN) 150 MG tablet; Take 1 tablet (150 mg total) by mouth once for 1 dose.        IFollow-up: Return as schedued.  An After Visit Summary was printed and given to the patient.  Brent Bulla, MD [facility] [phone]

## 2020-08-11 LAB — URINE CULTURE

## 2020-08-11 NOTE — Progress Notes (Signed)
Culture E. Coli sensitive to tetracycline lp

## 2020-08-26 ENCOUNTER — Ambulatory Visit (INDEPENDENT_AMBULATORY_CARE_PROVIDER_SITE_OTHER): Payer: Medicare Other | Admitting: Legal Medicine

## 2020-08-26 ENCOUNTER — Other Ambulatory Visit: Payer: Self-pay

## 2020-08-26 ENCOUNTER — Encounter: Payer: Self-pay | Admitting: Legal Medicine

## 2020-08-26 VITALS — BP 120/82 | HR 82 | Temp 97.3°F | Ht 66.5 in | Wt 201.0 lb

## 2020-08-26 DIAGNOSIS — J4540 Moderate persistent asthma, uncomplicated: Secondary | ICD-10-CM | POA: Diagnosis not present

## 2020-08-26 DIAGNOSIS — Z794 Long term (current) use of insulin: Secondary | ICD-10-CM

## 2020-08-26 DIAGNOSIS — E1169 Type 2 diabetes mellitus with other specified complication: Secondary | ICD-10-CM | POA: Diagnosis not present

## 2020-08-26 DIAGNOSIS — R1013 Epigastric pain: Secondary | ICD-10-CM | POA: Diagnosis not present

## 2020-08-26 DIAGNOSIS — I10 Essential (primary) hypertension: Secondary | ICD-10-CM

## 2020-08-26 LAB — CBC WITH DIFFERENTIAL/PLATELET
Basophils Absolute: 0.1 10*3/uL (ref 0.0–0.2)
Basos: 1 %
EOS (ABSOLUTE): 0.3 10*3/uL (ref 0.0–0.4)
Eos: 3 %
Hematocrit: 44.4 % (ref 34.0–46.6)
Hemoglobin: 14.8 g/dL (ref 11.1–15.9)
Immature Grans (Abs): 0 10*3/uL (ref 0.0–0.1)
Immature Granulocytes: 1 %
Lymphocytes Absolute: 3 10*3/uL (ref 0.7–3.1)
Lymphs: 36 %
MCH: 28.8 pg (ref 26.6–33.0)
MCHC: 33.3 g/dL (ref 31.5–35.7)
MCV: 87 fL (ref 79–97)
Monocytes Absolute: 0.4 10*3/uL (ref 0.1–0.9)
Monocytes: 5 %
Neutrophils Absolute: 4.5 10*3/uL (ref 1.4–7.0)
Neutrophils: 54 %
Platelets: 311 10*3/uL (ref 150–450)
RBC: 5.13 x10E6/uL (ref 3.77–5.28)
RDW: 13.3 % (ref 11.7–15.4)
WBC: 8.3 10*3/uL (ref 3.4–10.8)

## 2020-08-26 LAB — COMPREHENSIVE METABOLIC PANEL
ALT: 26 IU/L (ref 0–32)
AST: 28 IU/L (ref 0–40)
Albumin/Globulin Ratio: 2 (ref 1.2–2.2)
Albumin: 4.4 g/dL (ref 3.8–4.8)
Alkaline Phosphatase: 105 IU/L (ref 44–121)
BUN/Creatinine Ratio: 14 (ref 12–28)
BUN: 13 mg/dL (ref 8–27)
Bilirubin Total: 0.6 mg/dL (ref 0.0–1.2)
CO2: 24 mmol/L (ref 20–29)
Calcium: 9.7 mg/dL (ref 8.7–10.3)
Chloride: 102 mmol/L (ref 96–106)
Creatinine, Ser: 0.96 mg/dL (ref 0.57–1.00)
Globulin, Total: 2.2 g/dL (ref 1.5–4.5)
Glucose: 168 mg/dL — ABNORMAL HIGH (ref 65–99)
Potassium: 4.2 mmol/L (ref 3.5–5.2)
Sodium: 141 mmol/L (ref 134–144)
Total Protein: 6.6 g/dL (ref 6.0–8.5)
eGFR: 66 mL/min/{1.73_m2} (ref >59)

## 2020-08-26 LAB — HEMOGLOBIN A1C
Est. average glucose Bld gHb Est-mCnc: 174 mg/dL
Hgb A1c MFr Bld: 7.7 % — ABNORMAL HIGH (ref 4.8–5.6)

## 2020-08-26 NOTE — Progress Notes (Signed)
Subjective:  Patient ID: Suzanne Hardin, female    DOB: 1955/11/30  Age: 65 y.o. MRN: [id number]  Chief Complaint  Patient presents with  . Hypertension    89M fasting    HPI: chronic visit  She is having indigestion recently. No medicine.  Patient present with type 2 diabetes.  Specifically, this is type 2, insulin requiring diabetes, complicated by hypertension and hypercholesterolemia.  Compliance with treatment has been good; patient take medicines as directed, maintains diet and exercise regimen, follows up as directed, and is keeping glucose diary.  Date of  diagnosis 2010.  Depression screen has been performed.Tobacco screen nonsmoker. Current medicines for diabetes tuojeo 23 units bid.  Patient is on nothing for renal protection and nothing for cholesterol control.  Patient performs foot exams daily and last ophthalmologic exam was yes.  Patient presents for follow up of hypertension.  Patient tolerating none well with side effects.  Patient was diagnosed with hypertension 2010 so has been treated for hypertension for 10 years.Patient is working on maintaining diet and exercise regimen and follows up as directed. Complication include none.   Current Outpatient Medications on File Prior to Visit  Medication Sig Dispense Refill  . Alpha-Lipoic Acid 300 MG CAPS Take 300 mg by mouth daily.    . baclofen (LIORESAL) 10 MG tablet Take 10 mg by mouth 3 (three) times daily.    . Cholecalciferol (VITAMIN D) 50 MCG (2000 UT) tablet Take 2,000 Units by mouth daily.    . CONTOUR NEXT TEST test strip USE 1 STRIP TO CHECK GLUCOSE TWICE DAILY 100 each 6  . Cyanocobalamin 1000 MCG TBCR Take 1 tablet by mouth daily.    . D-Mannose POWD Take by mouth.    . Dulaglutide (TRULICITY) 0.75 MG/0.5ML SOPN Inject 0.75 mg into the skin once a week. 2 mL 6  . fluconazole (DIFLUCAN) 150 MG tablet TAKE 1 TABLET BY MOUTH EVERY DAY 7 tablet 0  . glucose blood test strip 1 each by Other route 3 (three) times  daily as needed. Use as instructed    . hydrOXYzine (ATARAX) 10 MG/5ML syrup Take by mouth.    . hydrOXYzine (VISTARIL) 50 MG capsule Take by mouth.     Marland Kitchen ipratropium-albuterol (DUONEB) 0.5-2.5 (3) MG/88ML SOLN Inhale 3 mLs into the lungs every 6 (six) hours as needed.     . Lancets MISC by Does not apply route.    Marland Kitchen NOVOLOG FLEXPEN 100 UNIT/ML FlexPen Inject into the skin daily. Patient uses slidding scale. Maximum 30 units uses only blood sugar >250mg /dl.  Using 8-10 units when sugars are high lately.    . pyridOXINE (VITAMIN B-6) 50 MG tablet Take 25 mg by mouth daily. Takes the p5pb6 version 25 mg daily    . Sodium Chloride-Sodium Bicarb (NETI POT SINUS WASH NA) Place into the nose as needed.    [name] SOLOSTAR 300 UNIT/ML Solostar Pen Inject 23 Units into the skin daily. (Patient taking differently: Inject 23 Units into the skin in the morning and at bedtime.) 6 pen 6  . TRIAMCINOLONE ACETONIDE, TOP, 0.05 % OINT Apply topically daily as needed. Uses as needed for rash, bug bites or on gums before dentist.    . Zinc 25 MG TABS Take by mouth.    . zinc gluconate 50 MG tablet Take 50 mg by mouth daily. Takes Monday-Friday.     No current facility-administered medications on file prior to visit.   Past Medical History:  Diagnosis Date  .  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 Cystitis   . Essential hypertension 09/16/2019  . Hypertension   . Moderate persistent asthma 09/16/2019  . Sciatica of right side   . Type 2 diabetes mellitus with other specified complication (HCC) 09/16/2019   Past Surgical History:  Procedure Laterality Date  . APPENDECTOMY  2000  . CATARACT EXTRACTION, BILATERAL    . CHOLECYSTECTOMY  2000  . TONSILLECTOMY AND ADENOIDECTOMY      Family History  Problem Relation Age of Onset  . Ulcers Father   . Peripheral Artery Disease Father    Social History   Socioeconomic History  . Marital status: Divorced    Spouse name: Not on file  . Number of children: 2  .  Years of education: Not on file  . Highest education level: Not on file  Occupational History  . Occupation: Disabled  Tobacco Use  . Smoking status: Never Smoker  . Smokeless tobacco: Never Used  Vaping Use  . Vaping Use: Never used  Substance and Sexual Activity  . Alcohol use: Never  . Drug use: Never  . Sexual activity: Yes    Partners: Male  Other Topics Concern  . Not on file  Social History Narrative  . Not on file   Social Determinants of Health   Financial Resource Strain: Not on file  Food Insecurity: Not on file  Transportation Needs: Not on file  Physical Activity: Not on file  Stress: Not on file  Social Connections: Not on file    Review of Systems  Constitutional: Negative for appetite change and fever.  HENT: Negative.   Eyes: Negative.   Respiratory: Negative for chest tightness and shortness of breath.   Cardiovascular: Negative for chest pain, palpitations and leg swelling.  Gastrointestinal: Positive for abdominal pain.  Endocrine: Negative.   Genitourinary: Negative for difficulty urinating and dysuria.  Musculoskeletal: Negative for arthralgias and back pain.  Skin: Negative.   Neurological: Negative.   Psychiatric/Behavioral: Negative.      Objective:  BP 120/82 (BP Location: Left Arm, Patient Position: Sitting, Cuff Size: Normal)   Pulse 82   Temp (!) 97.3 F (36.3 C) (Temporal)   Ht 5' 6.5" (1.689 m)   Wt 201 lb (91.2 kg)   SpO2 95%   BMI 31.96 kg/m   BP/Weight 08/26/2020 08/08/2020 06/01/2020  Systolic BP 120 144 128  Diastolic BP 82 80 80  Wt. (Lbs) 201 200 194  BMI 31.96 32.28 31.03    Physical Exam Vitals reviewed.  Constitutional:      Appearance: Normal appearance.  HENT:     Head: Normocephalic.     Right Ear: Tympanic membrane, ear canal and external ear normal.     Left Ear: Tympanic membrane, ear canal and external ear normal.     Nose: Nose normal.     Mouth/Throat:     Mouth: Mucous membranes are moist.  Eyes:      Extraocular Movements: Extraocular movements intact.     Conjunctiva/sclera: Conjunctivae normal.     Pupils: Pupils are equal, round, and reactive to light.  Cardiovascular:     Rate and Rhythm: Normal rate and regular rhythm.     Pulses: Normal pulses.     Heart sounds: Normal heart sounds. No murmur heard. No gallop.   Pulmonary:     Effort: Pulmonary effort is normal. No respiratory distress.     Breath sounds: No rales.  Abdominal:     General: Abdomen is flat. Bowel sounds are normal.  Palpations: Abdomen is soft.  Musculoskeletal:        General: Normal range of motion.     Cervical back: Normal range of motion and neck supple.  Skin:    General: Skin is warm and dry.     Capillary Refill: Capillary refill takes less than 2 seconds.  Neurological:     General: No focal deficit present.     Mental Status: She is alert and oriented to person, place, and time. Mental status is at baseline.  Psychiatric:        Mood and Affect: Mood normal.        Behavior: Behavior normal.        Thought Content: Thought content normal.        Judgment: Judgment normal.     Diabetic Foot Exam - Simple   Simple Foot Form Diabetic Foot exam was performed with the following findings: Yes 08/26/2020  8:11 AM  Visual Inspection No deformities, no ulcerations, no other skin breakdown bilaterally: Yes Sensation Testing Intact to touch and monofilament testing bilaterally: Yes Pulse Check Posterior Tibialis and Dorsalis pulse intact bilaterally: Yes Comments      Lab Results  Component Value Date   WBC 8.3 08/26/2020   HGB 14.8 08/26/2020   HCT 44.4 08/26/2020   PLT 311 08/26/2020   GLUCOSE 168 (H) 08/26/2020   CHOL 182 05/17/2020   TRIG 58 05/17/2020   HDL 57 05/17/2020   LDLCALC 114 (H) 05/17/2020   ALT 26 08/26/2020   AST 28 08/26/2020   NA 141 08/26/2020   K 4.2 08/26/2020   CL 102 08/26/2020   CREATININE 0.96 08/26/2020   BUN 13 08/26/2020   CO2 24 08/26/2020    TSH 1.760 01/15/2020   HGBA1C 7.7 (H) 08/26/2020   MICROALBUR 80 01/15/2020      Assessment & Plan:  Diagnoses and all orders for this visit: Type 2 diabetes mellitus with other specified complication, with long-term current use of insulin (HCC) -     Hemoglobin A1c An individual care plan for diabetes was established and reinforced today.  The patient's status was assessed using clinical findings on exam, labs and diagnostic testing. Patient success at meeting goals based on disease specific evidence-based guidelines and found to be good controlled. Medications were assessed and patient's understanding of the medical issues , including barriers were assessed. Recommend adherence to a diabetic diet, a graduated exercise program, HgbA1c level is checked quarterly, and urine microalbumin performed yearly .  Annual mono-filament sensation testing performed. Lower blood pressure and control hyperlipidemia is important. Get annual eye exams and annual flu shots and smoking cessation discussed.  Self management goals were discussed.  Primary hypertension -     CBC with Differential/Platelet -     Comprehensive metabolic panel An individual hypertension care plan was established and reinforced today.  The patient's status was assessed using clinical findings on exam and labs or diagnostic tests. The patient's success at meeting treatment goals on disease specific evidence-based guidelines and found to be well controlled. SELF MANAGEMENT: The patient and I together assessed ways to personally work towards obtaining the recommended goals. RECOMMENDATIONS: avoid decongestants found in common cold remedies, decrease consumption of alcohol, perform routine monitoring of BP with home BP cuff, exercise, reduction of dietary salt, take medicines as prescribed, try not to miss doses and quit smoking.  Regular exercise and maintaining a healthy weight is needed.  Stress reduction may help. A CLINICAL SUMMARY  including written plan identify  barriers to care unique to individual due to social or financial issues.  We attempt to mutually creat solutions for individual and family understanding.  Moderate persistent asthma, unspecified whether complicated This patient has asthma moderate and is on duoneb.  Patient is not having a flair.  Chronic medicines include duoneb. Addition new medicines none.  Asthma action plan is in place.   Dyspepsia -     H. pylori Screen Patient is having dyspepsia and is concerned about H. Pylori      Orders Placed This Encounter  Procedures  . CBC with Differential/Platelet  . Comprehensive metabolic panel  . Hemoglobin A1c  . H. pylori Screen      I spent 30 minutes dedicated to the care of this patient on the date of this encounter to include face-to-face time with the patient, as well as: record review  Follow-up: Return in about 4 months (around 12/26/2020).  An After Visit Summary was printed and given to the patient.  Brent Bulla, MD [facility] [phone]

## 2020-08-28 LAB — POCT H PYLORI SCREEN: H Pylori Screen, POC: NEGATIVE

## 2020-08-28 NOTE — Progress Notes (Signed)
Cbc all normal, glucose 168, kidney and liver tests normal, A1c 7.7 good lp

## 2020-08-28 NOTE — Progress Notes (Unsigned)
Chronic Care Management Pharmacy Note  08/29/2020 Name:  Suzanne Hardin MRN:  [id number] DOB:  22-Mar-1956  Subjective: Suzanne Hardin is an 65 y.o. year old female who is a primary patient of Henrene Pastor, Zeb Comfort, MD.  The CCM team was consulted for assistance with disease management and care coordination needs.    Engaged with patient by telephone for follow up visit in response to provider referral for pharmacy case management and/or care coordination services.   Consent to Services:  The patient was given information about Chronic Care Management services, agreed to services, and gave verbal consent prior to initiation of services.  Please see initial visit note for detailed documentation.   Patient Care Team: Lillard Anes, MD as PCP - General (Family Medicine) Burnice Logan, [facility] as Pharmacist (Pharmacist)  Recent office visits:  08/26/2020 - Cbc all normal, glucose 168, kidney and liver tests normal, A1c 7.7 good 08/08/2020 - acute cystitis with hematuria. E. Coli sensitive to tetracycline. Fluconazole prescribed.  06/21/2020 - abnormal urialysis and urine culture. Cirpo and fluconazole given.  Recent consult visits:  07/04/2020 - neurology - botox injections.  Hospital visits: None in previous 6 months  Objective:  Lab Results  Component Value Date   CREATININE 0.96 08/26/2020   BUN 13 08/26/2020   GFRNONAA 67 05/17/2020   GFRAA 78 05/17/2020   NA 141 08/26/2020   K 4.2 08/26/2020   CALCIUM 9.7 08/26/2020   CO2 24 08/26/2020    Lab Results  Component Value Date/Time   HGBA1C 7.7 (H) 08/26/2020 08:18 AM   HGBA1C 7.5 (H) 05/17/2020 09:21 AM   MICROALBUR 80 01/15/2020 08:28 AM    Last diabetic Eye exam: No results found for: HMDIABEYEEXA  Last diabetic Foot exam: No results found for: HMDIABFOOTEX   Lab Results  Component Value Date   CHOL 182 05/17/2020   HDL 57 05/17/2020   LDLCALC 114 (H) 05/17/2020   TRIG 58 05/17/2020   CHOLHDL 3.2  05/17/2020    Hepatic Function Latest Ref Rng & Units 08/26/2020 05/17/2020 01/15/2020  Total Protein 6.0 - 8.5 g/dL 6.6 6.4 6.6  Albumin 3.8 - 4.8 g/dL 4.4 4.4 4.5  AST 0 - 40 IU/L _0 ALT 0 - 32 IU/L _1 Alk Phosphatase 44 - 121 IU/L 105 105 101  Total Bilirubin 0.0 - 1.2 mg/dL 0.6 0.6 0.4    Lab Results  Component Value Date/Time   TSH 1.760 01/15/2020 09:07 AM    CBC Latest Ref Rng & Units 08/26/2020 05/17/2020 01/15/2020  WBC 3.4 - 10.8 x10E3/uL 8.3 8.3 7.3  Hemoglobin 11.1 - 15.9 g/dL 14.8 13.3 11.7  Hematocrit 34.0 - 46.6 % 44.4 39.3 36.9  Platelets 150 - 450 x10E3/uL 311 319 351    Lab Results  Component Value Date/Time   VD25OH 28.6 (L) 01/15/2020 09:13 AM    Clinical ASCVD: No  The 10-year ASCVD risk score Mikey Bussing DC Jr., et al., 2013) is: 7%   Values used to calculate the score:     Age: 26 years     Sex: Female     Is Non-Hispanic African American: No     Diabetic: Yes     Tobacco smoker: No     Systolic Blood Pressure: 233 mmHg     Is BP treated: No     HDL Cholesterol: 57 mg/dL     Total Cholesterol: 182 mg/dL    Depression screen [facility] 2/9 01/15/2020  Decreased Interest  0  Down, Depressed, Hopeless 0  PHQ - 2 Score 0     Social History   Tobacco Use  Smoking Status Never Smoker  Smokeless Tobacco Never Used   BP Readings from Last 3 Encounters:  08/26/20 120/82  08/08/20 (!) 144/80  06/01/20 128/80   Pulse Readings from Last 3 Encounters:  08/26/20 82  08/08/20 83  06/01/20 80   Wt Readings from Last 3 Encounters:  08/26/20 201 lb (91.2 kg)  08/08/20 200 lb (90.7 kg)  06/01/20 194 lb (88 kg)    Assessment/Interventions: Review of patient past medical history, allergies, medications, health status, including review of consultants reports, laboratory and other test data, was performed as part of comprehensive evaluation and provision of chronic care management services.   SDOH:  (Social Determinants of Health) assessments and  interventions performed: Yes   CCM Care Plan  Allergies  Allergen Reactions  . Dextromethorphan-Guaifenesin Itching  . Hydrocodone Other (See Comments)    Inflammatory Inflammatory Other reaction(s): Other (See Comments) Inflammatory Inflammatory   . Lidocaine Swelling  . Other Rash, Other (See Comments) and Swelling    Neurological damage fragances fragances  Neurological damage Fragances; YELLOW NUMBER 5;  Yellow #5 Yellow #5 Yellow #5 Yellow #5  High eye pressure High eye pressure High eye pressure   . Sodium Laureth Sulfate Dermatitis, Hives, Itching, Other (See Comments), Rash and Swelling  . Sulfa Antibiotics Hives  . Sulfites Shortness Of Breath    Other reaction(s): Shortness of Breath Other reaction(s): Shortness of Breath   . Articaine-Epinephrine Swelling  . Cymbalta [Duloxetine Hcl]   . Darvon [Propoxyphene]   . Duloxetine   . Gentamicin Sulfate   . Hctz [Hydrochlorothiazide]   . Humalog [Insulin Lispro]   . Keflex [Cephalexin]   . Lasix [Furosemide]   . Lisinopril-Hydrochlorothiazide   . Loratadine   . Macrodantin [Nitrofurantoin]   . Metformin   . Mucinex [Guaifenesin Er]   . Peanut-Containing Drug Products   . Penicillins   . Pioglitazone   . Tessalon Perles [Benzonatate]   . Travatan [Travoprost]   . Tylenol [Acetaminophen]   . Propofol Other (See Comments) and Nausea And Vomiting  . Sulfamethoxazole-Trimethoprim Rash    Medications Reviewed Today    Reviewed by Burnice Logan, [facility] (Pharmacist) on 08/29/20 at 1045  Med List Status: <None>  Medication Order Taking? Sig Documenting Provider Last Dose Status Informant  Alpha-Lipoic Acid 300 MG CAPS [id number] Yes Take 300 mg by mouth daily. [provider] Taking Active   baclofen (LIORESAL) 10 MG tablet [id number] Yes Take 10 mg by mouth 3 (three) times daily. [provider] Taking Active   Cholecalciferol (VITAMIN D) 50 MCG (2000 UT) tablet [id number] Yes Take 2,000  Units by mouth daily. [provider] Taking Active   CONTOUR NEXT TEST test strip [id number] Yes USE 1 STRIP TO [place] Lillard Anes, MD Taking Active   Cyanocobalamin 1000 MCG TBCR [id number] Yes Take 1 tablet by mouth daily. [provider] Taking Active   D-Mannose POWD [id number] Yes Take by mouth. [provider] Taking Active   Dulaglutide (TRULICITY) 9.41 DE/0.8XK SOPN [id number] Yes Inject 0.75 mg into the skin once a week. Lillard Anes, MD Taking Active   fluconazole (DIFLUCAN) 150 MG tablet [id number] No TAKE 1 TABLET BY MOUTH EVERY DAY  Patient not taking: Reported on 08/29/2020   Lillard Anes, MD Not Taking Active   glucose blood test strip [id number] Yes 1 each  by Other route 3 (three) times daily as needed. Use as instructed [provider] Taking Active   hydrOXYzine (ATARAX) 10 MG/5ML syrup [id number] Yes Take by mouth. [provider] Taking Active   hydrOXYzine (VISTARIL) 50 MG capsule [id number] Yes Take by mouth daily as needed. [provider] Taking Active   ipratropium-albuterol (DUONEB) 0.5-2.5 (3) MG/3ML SOLN [id number] Yes Inhale 3 mLs into the lungs every 6 (six) hours as needed.  [provider] Taking Active   Lancets Faxon [id number] Yes by Does not apply route. [provider] Taking Active   NOVOLOG FLEXPEN 100 UNIT/ML FlexPen [id number] Yes Inject into the skin daily. Patient uses slidding scale. Maximum 30 units uses only blood sugar >225m/dl.  Using 8-10 units when sugars are high lately. [provider] Taking Active   pyridOXINE (VITAMIN B-6) 50 MG tablet [id number] Take 25 mg by mouth daily. Takes the p5pb6 version 25 mg daily [provider] Taking Active   Sodium Chloride-Sodium Bicarb (NETI POT SINUS [place]) [id number] Place into the nose as needed. [provider] Taking Active   TOUJEO SOLOSTAR 300 UNIT/ML Solostar  Pen [id number] Inject 23 Units into the skin daily.  Patient taking differently: Inject 20 Units into the skin in the morning and at bedtime.   PLillard Anes MD Taking Active   TRIAMCINOLONE ACETONIDE, TOP, 0.05 % OINT [id number] Apply topically daily as needed. Uses as needed for rash, bug bites or on gums before dentist. [provider] Taking Active   Zinc 25 MG TABS [id number] Take by mouth.  Patient not taking: Reported on 08/29/2020   [provider] Not Taking Active   zinc gluconate 50 MG tablet [id number] Take 50 mg by mouth daily. Takes Monday-Friday. [provider] Taking Active           Patient Active Problem List   Diagnosis Date Noted  . Cystitis 08/08/2020  . Fever 06/21/2020  . Urinary urgency 06/21/2020  . Abnormal urinalysis 06/21/2020  . Sinusitis 06/01/2020  . 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 BMI 31.0-31.9,adult 01/15/2020  . Hypertension 09/16/2019  . Type 2 diabetes mellitus with other specified complication (HTyler 038/45/3646 . Moderate persistent asthma 09/16/2019  . Neuropathy, peripheral 05/12/2014  . 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 Carotid artery occlusion 10/28/2013  . Arthralgia 10/28/2013  . Vitamin D deficiency 10/16/2012  . Food intolerance 09/04/2012  . Allergy history, peanuts 09/04/2012  . 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 Influenza-Unspecified 04/09/2013    Conditions to be addressed/monitored:  Hypertension, Hyperlipidemia, Diabetes and Asthma  Care Plan : [place] Updates made by BBurnice Logan RPH since 08/29/2020 12:00 AM    Problem: hld, dm, htn   Priority: High  Onset Date: 08/29/2020    Long-Range Goal: Disease Management   Start Date: 08/29/2020  Expected End Date: 08/29/2021  This Visit's Progress: On track  Priority: High  Note:   Pharmacist Clinical Goal(s):  .Marland KitchenOver the next 90 days, patient will  verbalize ability to afford treatment regimen . achieve control of diabetes as evidenced by a1c and blood glucose through collaboration with PharmD and provider.    Interventions: . 1:1 collaboration with PLillard Anes MD regarding development and update of comprehensive plan of care as evidenced by provider attestation and co-signature . Inter-disciplinary care team collaboration (see longitudinal plan of care) . Comprehensive medication review performed; medication list updated  in electronic medical record  Hypertension (BP goal <130/80) -Controlled -Current treatment: . Diet and lifestyle -Medications previously tried: lisinopril, hydrochlorothiazide  -Current home readings: 115/60 -Current dietary habits: eating more crackers and milk to settle stomach since COVID -Current exercise habits: pool exercise therapy daily -Denies hypotensive/hypertensive symptoms -Educated on BP goals and benefits of medications for prevention of heart attack, stroke and kidney damage; Daily salt intake goal < 2300 mg; Exercise goal of 150 minutes per week; Importance of home blood pressure monitoring; Symptoms of hypotension and importance of maintaining adequate hydration; -Counseled to monitor BP at home monthly, document, and provide log at future appointments -Counseled on diet and exercise extensively  Hyperlipidemia: (LDL goal < 70) -Not ideally controlled -Current treatment: . Diet and lifestyle  -Medications previously tried: none reported -Current dietary patterns: eating more crackers and milk due to stomach symptoms.  -Current exercise habits: daily pool rehab exercise.  -Educated on Cholesterol goals;  Importance of limiting foods high in cholesterol; Exercise goal of 150 minutes per week; -Counseled on diet and exercise extensively  Diabetes (A1c goal <7%) -Not ideally controlled -Current medications: . Trulicity 0.25 mg weekly  . Novolog sliding scale before meals if  needed . Toujeo 20 units twice daily  -Medications previously tried: metformin, Victoza -Current home glucose readings . fasting glucose: 100-300 (elevated when infection/sick with COVID) -Denies hypoglycemic/hyperglycemic symptoms -Current meal patterns:  . breakfast: having to eat more crackers due to stomach  . drinks: milk, water, sage tea, ginger tea -Current exercise: daily pool exercise for rehab -Educated onA1c and blood sugar goals; Complications of diabetes including kidney damage, retinal damage, and cardiovascular disease; Exercise goal of 150 minutes per week; Benefits of weight loss; Benefits of routine self-monitoring of blood sugar; Carbohydrate counting and/or plate method -Counseled to check feet daily and get yearly eye exams -Counseled on diet and exercise extensively Recommended continuing to monitor blood sugar and consider increase of Trulicity with next lab results if needed.   Health Maintenance -Vaccine gaps: contraindications to flu or COVID -Current therapy:  . cyanocobalamin 1000 mcg daily  . Alpha-lipoic acid 300 mg daily  . D-mannose powder  . Vitamin D 2000 units daily  . Zinc 50 mg daily Monday-Friday . Vitamin b-6 25 mg daily  -Educated on Herbal supplement research is limited and benefits usually cannot be proven -Patient is satisfied with current therapy and denies issues -Counseled on diet and exercise extensively Recommended to continue current medication   Patient Goals/Self-Care Activities . Over the next 90 days, patient will:  - take medications as prescribed focus on medication adherence by using pill box check glucose 3-4 times daily , document, and provide at future appointments target a minimum of 150 minutes of moderate intensity exercise weekly Reduce carbohydrate intake and work to improve diet since COVID and stomach symptoms.   Follow Up Plan: Telephone follow up appointment with care management team member scheduled for: 4  months July 2022      Medication Assistance: Trulicity obtained through [employer] Patient assistance and Novonordisk medication assistance program.  Enrollment ends 06/24/2021  Patient's preferred pharmacy is:  [id number], [place] - [address] AT [address] [place] [address] Phone: [phone] Fax: [phone]  [place], [place] [address] [place] [address] Phone: [phone] Fax: [phone]  Uses pill box? Yes Pt endorses good compliance  We discussed: Current pharmacy is  preferred with insurance plan and patient is satisfied with pharmacy services Patient decided to: Continue current medication management strategy  Care Plan and Follow Up Patient Decision:  Patient agrees to Care Plan and Follow-up.  Plan: Telephone follow up appointment with care management team member scheduled for:  July 2022

## 2020-08-29 ENCOUNTER — Other Ambulatory Visit: Payer: Self-pay

## 2020-08-29 ENCOUNTER — Ambulatory Visit (INDEPENDENT_AMBULATORY_CARE_PROVIDER_SITE_OTHER): Payer: Medicare Other

## 2020-08-29 DIAGNOSIS — I10 Essential (primary) hypertension: Secondary | ICD-10-CM | POA: Diagnosis not present

## 2020-08-29 DIAGNOSIS — Z794 Long term (current) use of insulin: Secondary | ICD-10-CM | POA: Diagnosis not present

## 2020-08-29 DIAGNOSIS — E1169 Type 2 diabetes mellitus with other specified complication: Secondary | ICD-10-CM

## 2020-08-29 NOTE — Patient Instructions (Addendum)
Visit Information  Goals Addressed            This Visit's Progress   . Learn More About My Health       Timeframe:  Long-Range Goal Priority:  High Start Date:              08/29/20                Expected End Date:  08/29/2021                      Follow Up Date 01/05/2021    - ask questions - repeat what I heard to make sure I understand - bring a list of my medicines to the visit - speak up when I don't understand    Why is this important?    The best way to learn about your health and care is by talking to the doctor and nurse.   They will answer your questions and give you information in the way that you like best.    Notes:     Marland Kitchen Manage My Medicine       Timeframe:  Long-Range Goal Priority:  High Start Date:     08/29/20                         Expected End Date:  08/29/2020                     Follow Up Date 01/03/2022    - call for medicine refill 2 or 3 days before it runs out - keep a list of all the medicines I take; vitamins and herbals too - use a pillbox to sort medicine    Why is this important?   . These steps will help you keep on track with your medicines.   Notes:     Marland Kitchen Monitor and Manage My Blood Sugar-Diabetes Type 2       Timeframe:  Long-Range Goal Priority:  High Start Date:                             Expected End Date:                       Follow Up Date 01/03/2021    - check blood sugar at prescribed times - enter blood sugar readings and medication or insulin into daily log - take the blood sugar log to all doctor visits    Why is this important?    Checking your blood sugar at home helps to keep it from getting very high or very low.   Writing the results in a diary or log helps the doctor know how to care for you.   Your blood sugar log should have the time, date and the results.   Also, write down the amount of insulin or other medicine that you take.   Other information, like what you ate, exercise done and how  you were feeling, will also be helpful.     Notes:       Patient Care Plan: CCM Pharmacy Care Plan    Problem Identified: hld, dm, htn   Priority: High  Onset Date: 08/29/2020    Long-Range Goal: Disease Management   Start Date: 08/29/2020  Expected End Date: 08/29/2021  This Visit's Progress: On track  Priority: High  Note:  Pharmacist Clinical Goal(s):  Marland Kitchen Over the next 90 days, patient will verbalize ability to afford treatment regimen . achieve control of diabetes as evidenced by a1c and blood glucose through collaboration with PharmD and provider.    Interventions: . 1:1 collaboration with Abigail Miyamoto, MD regarding development and update of comprehensive plan of care as evidenced by provider attestation and co-signature . Inter-disciplinary care team collaboration (see longitudinal plan of care) . Comprehensive medication review performed; medication list updated in electronic medical record  Hypertension (BP goal <130/80) -Controlled -Current treatment: . Diet and lifestyle -Medications previously tried: lisinopril, hydrochlorothiazide  -Current home readings: 115/60 -Current dietary habits: eating more crackers and milk to settle stomach since COVID -Current exercise habits: pool exercise therapy daily -Denies hypotensive/hypertensive symptoms -Educated on BP goals and benefits of medications for prevention of heart attack, stroke and kidney damage; Daily salt intake goal < 2300 mg; Exercise goal of 150 minutes per week; Importance of home blood pressure monitoring; Symptoms of hypotension and importance of maintaining adequate hydration; -Counseled to monitor BP at home monthly, document, and provide log at future appointments -Counseled on diet and exercise extensively  Hyperlipidemia: (LDL goal < 70) -Not ideally controlled -Current treatment: . Diet and lifestyle  -Medications previously tried: none reported -Current dietary patterns: eating more  crackers and milk due to stomach symptoms.  -Current exercise habits: daily pool rehab exercise.  -Educated on Cholesterol goals;  Importance of limiting foods high in cholesterol; Exercise goal of 150 minutes per week; -Counseled on diet and exercise extensively  Diabetes (A1c goal <7%) -Not ideally controlled -Current medications: . Trulicity 0.75 mg weekly  . Novolog sliding scale before meals if needed . Toujeo 20 units twice daily  -Medications previously tried: metformin, Victoza -Current home glucose readings . fasting glucose: 100-300 (elevated when infection/sick with COVID) -Denies hypoglycemic/hyperglycemic symptoms -Current meal patterns:  . breakfast: having to eat more crackers due to stomach  . drinks: milk, water, sage tea, ginger tea -Current exercise: daily pool exercise for rehab -Educated onA1c and blood sugar goals; Complications of diabetes including kidney damage, retinal damage, and cardiovascular disease; Exercise goal of 150 minutes per week; Benefits of weight loss; Benefits of routine self-monitoring of blood sugar; Carbohydrate counting and/or plate method -Counseled to check feet daily and get yearly eye exams -Counseled on diet and exercise extensively Recommended continuing to monitor blood sugar and consider increase of Trulicity with next lab results if needed.   Health Maintenance -Vaccine gaps: contraindications to flu or COVID -Current therapy:  . cyanocobalamin 1000 mcg daily  . Alpha-lipoic acid 300 mg daily  . D-mannose powder  . Vitamin D 2000 units daily  . Zinc 50 mg daily Monday-Friday . Vitamin b-6 25 mg daily  -Educated on Herbal supplement research is limited and benefits usually cannot be proven -Patient is satisfied with current therapy and denies issues -Counseled on diet and exercise extensively Recommended to continue current medication   Patient Goals/Self-Care Activities . Over the next 90 days, patient will:  -  take medications as prescribed focus on medication adherence by using pill box check glucose 3-4 times daily , document, and provide at future appointments target a minimum of 150 minutes of moderate intensity exercise weekly Reduce carbohydrate intake and work to improve diet since COVID and stomach symptoms.   Follow Up Plan: Telephone follow up appointment with care management team member scheduled for: 4 months July 2022      The patient verbalized understanding of instructions, educational  materials, and care plan provided today and declined offer to receive copy of patient instructions, educational materials, and care plan.  Telephone follow up appointment with pharmacy team member scheduled for: 01/05/2021  Earvin Hansen, [facility]  Probiotics Probiotics are the good bacteria and yeasts that live in your body and keep your digestive system healthy. Probiotics also help your body's defense system (immune system) and protect your body against the growth of harmful bacteria. Your health care provider may recommend taking a probiotic if you are taking antibiotics or have certain medical conditions, such as:  Diarrhea.  Constipation.  Irritable bowel syndrome.  Lung infections.  Yeast infections.  Acne, eczema, and other skin conditions.  Frequent urinary tract infections. What affects the balance of bacteria in my body? The balance of good bacteria in your body can be affected by:  Antibiotic medicines. These medicines treat infections caused by bacteria. Unfortunately, they may kill the good bacteria in your body as well as the bad bacteria.  Certain medical conditions. Conditions related to an imbalance of bacteria include: ? Stomach and intestine (gastrointestinal) infections. ? Lung infections. ? Skin infections. ? Vaginal infections. ? Inflammatory bowel diseases. ? Stomach ulcers (gastric ulcers). ? Tooth decay and gum disease (periodontal disease).  Stress.  Poor  diet. What type of probiotic is right for me? Probiotics contain different types of bacteria (strains). Strains commonly found in probiotics include:  Lactobacillus.  Saccharomyces.  Bifidobacterium. Specific strains have been shown to be more effective for certain health conditions. Ask your health care provider which strain or strains you should use and how often. Probiotics come in many different forms, strain combinations, and strengths. Some may need to be refrigerated. Always read the label for storage and usage instructions. Certain foods, such as yogurt, contain probiotics. Probiotics can also be bought as a supplement at a pharmacy, health food store, or grocery store. Talk to your health care provider before starting any supplement.   What are the side effects of probiotics? Some people have side effects when taking probiotics. Side effects are usually temporary and may include:  Gas.  Bloating.  Cramping. Serious side effects are rare. Follow these instructions at home:  If you are taking probiotics with antibiotics: ? Wait at least 2 hours between taking your medicine and the probiotic. ? Eat foods high in fiber, such as whole grains, beans, and vegetables. These foods can help good bacteria grow. ? Avoid certain foods as told by your health care provider.   Summary  Probiotics are the good bacteria and yeasts that live in your body and keep you and your digestive system healthy.  Certain foods, such as yogurt, contain probiotics.  Probiotics can be taken as supplements. They can be bought at a pharmacy, health food store, or grocery store. They come in many different forms, strain combinations, and strengths.  Be sure to talk with your health care provider before taking a probiotic supplement. This information is not intended to replace advice given to you by your health care provider. Make sure you discuss any questions you have with your health care  provider. Document Revised: 02/28/2018 Document Reviewed: 06/26/2017 Elsevier Patient Education  2021 [employer].

## 2020-09-02 ENCOUNTER — Telehealth: Payer: Self-pay

## 2020-09-02 NOTE — Progress Notes (Signed)
° ° °  Chronic Care Management Pharmacy Assistant   Name: Suzanne Hardin  MRN: [id number] DOB: 1956/05/24    Reason for Encounter: Medication Review for Toujeo PAP    Medications: Outpatient Encounter Medications as of 09/02/2020  Medication Sig   Alpha-Lipoic Acid 300 MG CAPS Take 300 mg by mouth daily.   baclofen (LIORESAL) 10 MG tablet Take 10 mg by mouth 3 (three) times daily.   Cholecalciferol (VITAMIN D) 50 MCG (2000 UT) tablet Take 2,000 Units by mouth daily.   CONTOUR NEXT TEST test strip USE 1 STRIP TO CHECK GLUCOSE TWICE DAILY   Cyanocobalamin 1000 MCG TBCR Take 1 tablet by mouth daily.   D-Mannose POWD Take by mouth.   Dulaglutide (TRULICITY) 0.75 MG/0.5ML SOPN Inject 0.75 mg into the skin once a week.   fluconazole (DIFLUCAN) 150 MG tablet TAKE 1 TABLET BY MOUTH EVERY DAY (Patient not taking: Reported on 08/29/2020)   glucose blood test strip 1 each by Other route 3 (three) times daily as needed. Use as instructed   hydrOXYzine (ATARAX) 10 MG/5ML syrup Take by mouth.   hydrOXYzine (VISTARIL) 50 MG capsule Take by mouth daily as needed.   ipratropium-albuterol (DUONEB) 0.5-2.5 (3) MG/3ML SOLN Inhale 3 mLs into the lungs every 6 (six) hours as needed.    Lancets MISC by Does not apply route.   NOVOLOG FLEXPEN 100 UNIT/ML FlexPen Inject into the skin daily. Patient uses slidding scale. Maximum 30 units uses only blood sugar >250mg /dl.  Using 8-10 units when sugars are high lately.   pyridOXINE (VITAMIN B-6) 50 MG tablet Take 25 mg by mouth daily. Takes the p5pb6 version 25 mg daily   Sodium Chloride-Sodium Bicarb (NETI POT SINUS WASH NA) Place into the nose as needed.   TOUJEO SOLOSTAR 300 UNIT/ML Solostar Pen Inject 23 Units into the skin daily. (Patient taking differently: Inject 20 Units into the skin in the morning and at bedtime.)   TRIAMCINOLONE ACETONIDE, TOP, 0.05 % OINT Apply topically daily as needed. Uses as needed for rash, bug bites or on gums  before dentist.   Zinc 25 MG TABS Take by mouth. (Patient not taking: Reported on 08/29/2020)   zinc gluconate 50 MG tablet Take 50 mg by mouth daily. Takes Monday-Friday.   No facility-administered encounter medications on file as of 09/02/2020.    I called [employer] to check on the status of her Toujeo PAP application.  The representative stated that it was approved as of 09/01/20 and would end on 06/24/21.  She should expect the medication at the office in 5-7 business days.  I called and notified the patient and Lucia Gaskins, CPP of the status update.   [name], CMA Clinical Pharmacist Assistant [phone]

## 2020-09-20 ENCOUNTER — Telehealth: Payer: Self-pay

## 2020-09-20 NOTE — Progress Notes (Signed)
Chronic Care Management Pharmacy Assistant   Name: Suzanne Hardin  MRN: [id number] DOB: 12/28/1955   Reason for Encounter: Adherence review    Medications: Outpatient Encounter Medications as of 09/20/2020  Medication Sig  . Alpha-Lipoic Acid 300 MG CAPS Take 300 mg by mouth daily.  . baclofen (LIORESAL) 10 MG tablet Take 10 mg by mouth 3 (three) times daily.  . Cholecalciferol (VITAMIN D) 50 MCG (2000 UT) tablet Take 2,000 Units by mouth daily.  . CONTOUR NEXT TEST test strip USE 1 STRIP TO CHECK GLUCOSE TWICE DAILY  . Cyanocobalamin 1000 MCG TBCR Take 1 tablet by mouth daily.  . D-Mannose POWD Take by mouth.  . Dulaglutide (TRULICITY) 7.74 JO/8.7OM SOPN Inject 0.75 mg into the skin once a week.  . fluconazole (DIFLUCAN) 150 MG tablet TAKE 1 TABLET BY MOUTH EVERY DAY (Patient not taking: Reported on 08/29/2020)  . glucose blood test strip 1 each by Other route 3 (three) times daily as needed. Use as instructed  . hydrOXYzine (ATARAX) 10 MG/5ML syrup Take by mouth.  . hydrOXYzine (VISTARIL) 50 MG capsule Take by mouth daily as needed.  Marland Kitchen ipratropium-albuterol (DUONEB) 0.5-2.5 (3) MG/3ML SOLN Inhale 3 mLs into the lungs every 6 (six) hours as needed.   . Lancets MISC by Does not apply route.  Marland Kitchen NOVOLOG FLEXPEN 100 UNIT/ML FlexPen Inject into the skin daily. Patient uses slidding scale. Maximum 30 units uses only blood sugar >224m/dl.  Using 8-10 units when sugars are high lately.  . pyridOXINE (VITAMIN B-6) 50 MG tablet Take 25 mg by mouth daily. Takes the p5pb6 version 25 mg daily  . Sodium Chloride-Sodium Bicarb (NETI POT SINUS WJardineNA) Place into the nose as needed.  .Nelva NaySOLOSTAR 300 UNIT/ML Solostar Pen Inject 23 Units into the skin daily. (Patient taking differently: Inject 20 Units into the skin in the morning and at bedtime.)  . TRIAMCINOLONE ACETONIDE, TOP, 0.05 % OINT Apply topically daily as needed. Uses as needed for rash, bug bites or on gums before dentist.  .  Zinc 25 MG TABS Take by mouth. (Patient not taking: Reported on 08/29/2020)  . zinc gluconate 50 MG tablet Take 50 mg by mouth daily. Takes Monday-Friday.   No facility-administered encounter medications on file as of 09/20/2020.    Chart review noted the patient has appointments on: 12/28/20-Dr. PHenrene Pastor PCP 01/05/21-Sara BOwens Shark CBristolGaps Overdue     Never  Done OPHTHALMOLOGY EXAM (Yearly)    Never  Done HIV Screening (Once)    Jun 25 2016 COLONOSCOPY (Pts [id number] coverage will need to be confirmed) (Every 10 Years) Last completed: Jun 25, 2006    Medication adherence Total Gaps - All Measures 5  Total Gaps - Star Measures 3 ? Breast Cancer Screening (BCS) Met: Breast Cancer Screening Performed: Normal Result ? Controlling High Blood Pressure (CBP) Met: BP < 140/90 ? Diabetes: HbA1c Control <= 9.0% (CDC-A1c) Not Met: A1c greater than or equal to 8 ? Diabetes: Eye exam (retinal) performed (CDC-Eye) 0 ? Diabetes: Medical attention for nephropathy (CDC-Neph) 0 ? Care for Older Adults (COA): Medication Review 0 ? Care for Older Adults (COA): Pain Assessment 0 ? Colorectal Cancer Screening (COL) Not Met: Screening Completed: No results documented ? Medication Adherence for Cholesterol (Statins) (MAC) 0 ? Medication Adherence for Diabetes Medications (MAD) 0 ? Medication Adherence for Hypertension (RAS antogoniststs) ([facility] ? Medication Reconciliation Post-Discharge (MRP) 0 ? Osteoporosis Management in Women Who Had a Fracture (  OMW) 0 ? Plan All-Cause Readmissions (PCR) 0 ? Statin Therapy for Patients with Cardiovascular Disease (SPC) 0 ? Statin Use in Persons with Diabetes (SUPD) 0 ? Statin Use in Persons with Diabetes (SUPD) Not Met: No Statin Therapy Visits: Wellness Bundle [facility]) Met: AWV performed Visits: Annual Wellness Visit (AWV) Not Met: AWV not performed Controlling High Blood Pressure (CBP) 0 Diabetes: HbA1c Testing (CDC-A1c Testing) Met: A1c Testing  Documented Diabetes: Eye exam (retinal) performed (CDC-Eye) Not Met: No Eye Exam Documented in EHR or by [place] for Older Adults (COA): Medication Review 0 Care for Older Adults (COA): Pain Assessment 0 Plan All-Cause Readmissions (PCR) [place], [place] Pharmacist Assistant [phone]

## 2020-10-03 DIAGNOSIS — E119 Type 2 diabetes mellitus without complications: Secondary | ICD-10-CM | POA: Diagnosis not present

## 2020-10-03 DIAGNOSIS — G243 Spasmodic torticollis: Secondary | ICD-10-CM | POA: Diagnosis not present

## 2020-10-03 DIAGNOSIS — Z882 Allergy status to sulfonamides status: Secondary | ICD-10-CM | POA: Diagnosis not present

## 2020-10-03 DIAGNOSIS — Z888 Allergy status to other drugs, medicaments and biological substances status: Secondary | ICD-10-CM | POA: Diagnosis not present

## 2020-10-03 DIAGNOSIS — H40053 Ocular hypertension, bilateral: Secondary | ICD-10-CM | POA: Diagnosis not present

## 2020-10-03 DIAGNOSIS — K219 Gastro-esophageal reflux disease without esophagitis: Secondary | ICD-10-CM | POA: Diagnosis not present

## 2020-10-24 ENCOUNTER — Encounter: Payer: Self-pay | Admitting: Legal Medicine

## 2020-10-24 ENCOUNTER — Other Ambulatory Visit: Payer: Self-pay

## 2020-10-24 ENCOUNTER — Ambulatory Visit (INDEPENDENT_AMBULATORY_CARE_PROVIDER_SITE_OTHER): Payer: Medicare Other | Admitting: Legal Medicine

## 2020-10-24 VITALS — BP 136/80 | HR 88 | Temp 97.4°F | Ht 66.5 in | Wt 201.0 lb

## 2020-10-24 DIAGNOSIS — N3001 Acute cystitis with hematuria: Secondary | ICD-10-CM

## 2020-10-24 LAB — POCT URINALYSIS DIPSTICK
Bilirubin, UA: NEGATIVE
Glucose, UA: NEGATIVE
Ketones, UA: NEGATIVE
Nitrite, UA: POSITIVE
Protein, UA: POSITIVE — AB
Spec Grav, UA: 1.02 (ref 1.010–1.025)
Urobilinogen, UA: 0.2 E.U./dL
pH, UA: 6 (ref 5.0–8.0)

## 2020-10-24 MED ORDER — DOXYCYCLINE HYCLATE 100 MG PO TABS: 100.0000 mg | ORAL_TABLET | Freq: Two times a day (BID) | ORAL | 0 refills | Status: DC

## 2020-10-24 NOTE — Progress Notes (Signed)
Acute Office Visit  Subjective:    Patient ID: Suzanne Hardin, female    DOB: 22-Apr-1956, 65 y.o.   MRN: [id number]  Chief Complaint  Patient presents with  . Urinary Tract Infection    HPI Patient is in today for UTI sxs. States symptoms started over the weekend no complaints other than she has a urgency to go. Denies back pain/abdominal pain/dysuria/fever or chills.   Past Medical History:  Diagnosis Date  . 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 Cystitis   . Essential hypertension 09/16/2019  . Hypertension   . Moderate persistent asthma 09/16/2019  . Sciatica of right side   . Type 2 diabetes mellitus with other specified complication ([place]) 6/73/4193    Past Surgical History:  Procedure Laterality Date  . APPENDECTOMY  2000  . CATARACT EXTRACTION, BILATERAL    . CHOLECYSTECTOMY  2000  . TONSILLECTOMY AND ADENOIDECTOMY      Family History  Problem Relation Age of Onset  . Ulcers Father   . Peripheral Artery Disease Father     Social History   Socioeconomic History  . Marital status: Divorced    Spouse name: Not on file  . Number of children: 2  . Years of education: Not on file  . Highest education level: Not on file  Occupational History  . Occupation: Disabled  Tobacco Use  . Smoking status: Never Smoker  . Smokeless tobacco: Never Used  Vaping Use  . Vaping Use: Never used  Substance and Sexual Activity  . Alcohol use: Never  . Drug use: Never  . Sexual activity: Yes    Partners: Male  Other Topics Concern  . Not on file  Social History Narrative  . Not on file   Social Determinants of Health   Financial Resource Strain: Not on file  Food Insecurity: No Food Insecurity  . Worried About Charity fundraiser in the Last Year: Never true  . Ran Out of Food in the Last Year: Never true  Transportation Needs: No Transportation Needs  . Lack of Transportation (Medical): No  . Lack of Transportation (Non-Medical): No  Physical Activity: Sufficiently  Active  . Days of Exercise per Week: 5 days  . Minutes of Exercise per Session: 30 min  Stress: Not on file  Social Connections: Not on file  Intimate Partner Violence: Not on file    Outpatient Medications Prior to Visit  Medication Sig Dispense Refill  . Alpha-Lipoic Acid 300 MG CAPS Take 300 mg by mouth daily.    . baclofen (LIORESAL) 10 MG tablet Take 10 mg by mouth 3 (three) times daily.    . Cholecalciferol (VITAMIN D) 50 MCG (2000 UT) tablet Take 2,000 Units by mouth daily.    . CONTOUR NEXT TEST test strip USE 1 STRIP TO CHECK GLUCOSE TWICE DAILY 100 each 6  . Cyanocobalamin 1000 MCG TBCR Take 1 tablet by mouth daily.    . D-Mannose POWD Take by mouth.    . Dulaglutide (TRULICITY) 7.90 WI/0.9BD SOPN Inject 0.75 mg into the skin once a week. 2 mL 6  . fluconazole (DIFLUCAN) 150 MG tablet TAKE 1 TABLET BY MOUTH EVERY DAY (Patient not taking: Reported on 08/29/2020) 7 tablet 0  . glucose blood test strip 1 each by Other route 3 (three) times daily as needed. Use as instructed    . hydrOXYzine (ATARAX) 10 MG/5ML syrup Take by mouth.    . hydrOXYzine (VISTARIL) 50 MG capsule Take by mouth daily as needed.    Marland Kitchen  ipratropium-albuterol (DUONEB) 0.5-2.5 (3) MG/3ML SOLN Inhale 3 mLs into the lungs every 6 (six) hours as needed.     . Lancets MISC by Does not apply route.    Marland Kitchen NOVOLOG FLEXPEN 100 UNIT/ML FlexPen Inject into the skin daily. Patient uses slidding scale. Maximum 30 units uses only blood sugar >270m/dl.  Using 8-10 units when sugars are high lately.    . pyridOXINE (VITAMIN B-6) 50 MG tablet Take 25 mg by mouth daily. Takes the p5pb6 version 25 mg daily    . Sodium Chloride-Sodium Bicarb (NETI POT SINUS WChirenoNA) Place into the nose as needed.    .Nelva NaySOLOSTAR 300 UNIT/ML Solostar Pen Inject 23 Units into the skin daily. (Patient taking differently: Inject 20 Units into the skin in the morning and at bedtime.) 6 pen 6  . TRIAMCINOLONE ACETONIDE, TOP, 0.05 % OINT Apply  topically daily as needed. Uses as needed for rash, bug bites or on gums before dentist.    . Zinc 25 MG TABS Take by mouth. (Patient not taking: Reported on 08/29/2020)    . zinc gluconate 50 MG tablet Take 50 mg by mouth daily. Takes Monday-Friday.     No facility-administered medications prior to visit.    Allergies  Allergen Reactions  . Dextromethorphan-Guaifenesin Itching  . Hydrocodone Other (See Comments)    Inflammatory Inflammatory Other reaction(s): Other (See Comments) Inflammatory Inflammatory   . Lidocaine Swelling  . Other Rash, Other (See Comments) and Swelling    Neurological damage fragances fragances  Neurological damage Fragances; YELLOW NUMBER 5;  Yellow #5 Yellow #5 Yellow #5 Yellow #5  High eye pressure High eye pressure High eye pressure   . Sodium Laureth Sulfate Dermatitis, Hives, Itching, Other (See Comments), Rash and Swelling  . Sulfa Antibiotics Hives  . Sulfites Shortness Of Breath    Other reaction(s): Shortness of Breath Other reaction(s): Shortness of Breath   . Articaine-Epinephrine Swelling  . Cymbalta [Duloxetine Hcl]   . Darvon [Propoxyphene]   . Duloxetine   . Gentamicin Sulfate   . Hctz [Hydrochlorothiazide]   . Humalog [Insulin Lispro]   . Keflex [Cephalexin]   . Lasix [Furosemide]   . Lisinopril-Hydrochlorothiazide   . Loratadine   . Macrodantin [Nitrofurantoin]   . Metformin   . Mucinex [Guaifenesin Er]   . Peanut-Containing Drug Products   . Penicillins   . Pioglitazone   . Tessalon Perles [Benzonatate]   . Travatan [Travoprost]   . Tylenol [Acetaminophen]   . Propofol Other (See Comments) and Nausea And Vomiting  . Sulfamethoxazole-Trimethoprim Rash    Review of Systems  Constitutional: Negative for chills, fatigue and fever.  HENT: Negative for congestion, ear pain, rhinorrhea and sore throat.   Respiratory: Negative for cough and shortness of breath.   Cardiovascular: Negative for chest pain.   Gastrointestinal: Negative for abdominal pain, constipation, diarrhea, nausea and vomiting.  Genitourinary: Positive for frequency and urgency. Negative for dysuria and vaginal pain.  Musculoskeletal: Negative for back pain.  Neurological: Negative.        Objective:    Physical Exam Vitals reviewed.  Constitutional:      Appearance: Normal appearance.  Cardiovascular:     Rate and Rhythm: Normal rate and regular rhythm.     Pulses: Normal pulses.     Heart sounds: Normal heart sounds. No murmur heard. No gallop.   Pulmonary:     Effort: Pulmonary effort is normal. No respiratory distress.     Breath sounds: Normal breath sounds. No  rales.  Abdominal:     General: Bowel sounds are normal. There is no distension.     Tenderness: There is no abdominal tenderness.  Musculoskeletal:     Cervical back: Normal range of motion and neck supple.  Neurological:     Mental Status: She is alert.     BP 136/80   Pulse 88   Temp (!) 97.4 F (36.3 C)   Ht 5' 6.5" (1.689 m)   Wt 201 lb (91.2 kg)   SpO2 99%   BMI 31.96 kg/m  Wt Readings from Last 3 Encounters:  10/24/20 201 lb (91.2 kg)  08/26/20 201 lb (91.2 kg)  08/08/20 200 lb (90.7 kg)    Health Maintenance Due  Topic Date Due  . Hepatitis C Screening  Never done  . OPHTHALMOLOGY EXAM  Never done  . HIV Screening  Never done  . Fecal DNA (Cologuard)  Never done    There are no preventive care reminders to display for this patient.   Lab Results  Component Value Date   TSH 1.760 01/15/2020   Lab Results  Component Value Date   WBC 8.3 08/26/2020   HGB 14.8 08/26/2020   HCT 44.4 08/26/2020   MCV 87 08/26/2020   PLT 311 08/26/2020   Lab Results  Component Value Date   NA 141 08/26/2020   K 4.2 08/26/2020   CO2 24 08/26/2020   GLUCOSE 168 (H) 08/26/2020   BUN 13 08/26/2020   CREATININE 0.96 08/26/2020   BILITOT 0.6 08/26/2020   ALKPHOS 105 08/26/2020   AST 28 08/26/2020   ALT 26 08/26/2020   PROT 6.6  08/26/2020   ALBUMIN 4.4 08/26/2020   CALCIUM 9.7 08/26/2020   EGFR 66 08/26/2020   Lab Results  Component Value Date   CHOL 182 05/17/2020   Lab Results  Component Value Date   HDL 57 05/17/2020   Lab Results  Component Value Date   LDLCALC 114 (H) 05/17/2020   Lab Results  Component Value Date   TRIG 58 05/17/2020   Lab Results  Component Value Date   CHOLHDL 3.2 05/17/2020   Lab Results  Component Value Date   HGBA1C 7.7 (H) 08/26/2020       Assessment & Plan:  Diagnoses and all orders for this visit: Acute cystitis with hematuria -     POCT urinalysis dipstick -     Urine Culture -     doxycycline (VIBRA-TABS) 100 MG tablet; Take 1 tablet (100 mg total) by mouth 2 (two) times daily. Patient is having UTI needs doxycycline without dyes       I spent 15 minutes dedicated to the care of this patient on the date of this encounter to include face-to-face time with the patient, as well as:   Follow-up: Return if symptoms worsen or fail to improve.  An After Visit Summary was printed and given to the patient.  Reinaldo Meeker, MD [facility] [phone]

## 2020-10-24 NOTE — Progress Notes (Signed)
Large wbc and nitrite lp

## 2020-10-27 LAB — URINE CULTURE

## 2020-10-27 NOTE — Progress Notes (Signed)
Urine culture:Klebsiella sensitive to tetracycline lp

## 2020-10-28 ENCOUNTER — Telehealth: Payer: Self-pay

## 2020-10-28 NOTE — Progress Notes (Signed)
Chronic Care Management Pharmacy Assistant   Name: TARYNN GARLING  MRN: [id number] DOB: 1955-09-18  Reason for Encounter: Disease State for diabetes   Recent office visits:  10/24/20-Dr. Sedalia Muta PCP-UTI-start Doxycycline,   10/03/20-[facility],  Raised intraocular pressure of both eyes, Botox injection for cervical dystonia   08/29/20-Sara Manson Passey, CPP  08/26/20-Dr. Marina Goodell PCP, Labs, H. Pylori screen  Cbc all normal, glucose 168, kidney and liver tests normal, A1c 7.7 good   Recent consult visits:  none  Hospital visits:  None in previous 6 months  Medications: Outpatient Encounter Medications as of 10/28/2020  Medication Sig  . Alpha-Lipoic Acid 300 MG CAPS Take 300 mg by mouth daily.  . baclofen (LIORESAL) 10 MG tablet Take 10 mg by mouth 3 (three) times daily.  . Cholecalciferol (VITAMIN D) 50 MCG (2000 UT) tablet Take 2,000 Units by mouth daily.  . CONTOUR NEXT TEST test strip USE 1 STRIP TO CHECK GLUCOSE TWICE DAILY  . Cyanocobalamin 1000 MCG TBCR Take 1 tablet by mouth daily.  . D-Mannose POWD Take by mouth.  . doxycycline (VIBRA-TABS) 100 MG tablet Take 1 tablet (100 mg total) by mouth 2 (two) times daily.  . Dulaglutide (TRULICITY) 0.75 MG/0.5ML SOPN Inject 0.75 mg into the skin once a week.  . fluconazole (DIFLUCAN) 150 MG tablet TAKE 1 TABLET BY MOUTH EVERY DAY (Patient not taking: Reported on 08/29/2020)  . glucose blood test strip 1 each by Other route 3 (three) times daily as needed. Use as instructed  . hydrOXYzine (ATARAX) 10 MG/5ML syrup Take by mouth.  . hydrOXYzine (VISTARIL) 50 MG capsule Take by mouth daily as needed.  Marland Kitchen ipratropium-albuterol (DUONEB) 0.5-2.5 (3) MG/3ML SOLN Inhale 3 mLs into the lungs every 6 (six) hours as needed.   . Lancets MISC by Does not apply route.  Marland Kitchen NOVOLOG FLEXPEN 100 UNIT/ML FlexPen Inject into the skin daily. Patient uses slidding scale. Maximum 30 units uses only blood sugar >250mg /dl.  Using 8-10 units when sugars are high  lately.  . pyridOXINE (VITAMIN B-6) 50 MG tablet Take 25 mg by mouth daily. Takes the p5pb6 version 25 mg daily  . Sodium Chloride-Sodium Bicarb (NETI POT SINUS WASH NA) Place into the nose as needed.  [name] SOLOSTAR 300 UNIT/ML Solostar Pen Inject 23 Units into the skin daily. (Patient taking differently: Inject 20 Units into the skin in the morning and at bedtime.)  . TRIAMCINOLONE ACETONIDE, TOP, 0.05 % OINT Apply topically daily as needed. Uses as needed for rash, bug bites or on gums before dentist.  . Zinc 25 MG TABS Take by mouth. (Patient not taking: Reported on 08/29/2020)  . zinc gluconate 50 MG tablet Take 50 mg by mouth daily. Takes Monday-Friday.   No facility-administered encounter medications on file as of 10/28/2020.    Recent Relevant Labs: Lab Results  Component Value Date/Time   HGBA1C 7.7 (H) 08/26/2020 08:18 AM   HGBA1C 7.5 (H) 05/17/2020 09:21 AM   MICROALBUR 80 01/15/2020 08:28 AM    Kidney Function Lab Results  Component Value Date/Time   CREATININE 0.96 08/26/2020 08:18 AM   CREATININE 0.91 05/17/2020 09:21 AM   GFRNONAA 67 05/17/2020 09:21 AM   GFRAA 78 05/17/2020 09:21 AM     . Current antihyperglycemic regimen:   Trulicity 0.75 mg weekly   Novolog sliding scale before meals if needed  Toujeo 20 units twice daily    Patient verbally confirms she is taking the above medications as directed. Yes  .  What recent interventions/DTPs have been made to improve glycemic control:  Patient stated she is doing well, she is pleased to see her numbers improving. Patient stated she stays very active.   . Have there been any recent hospitalizations or ED visits since last visit with CPP? No  . Patient denies hypoglycemic symptoms, including None  . Patient denies hyperglycemic symptoms, including none  . How often are you checking your blood sugar? once daily  . What are your blood sugars ranging?  Patient stated her blood sugars run between 105-135 in the  mornings, today it was 86.  Patient stated this is rare for her.  She said she took 25 units last night and said that may have caused her lower number today, she said she ate a banana and it came up to 109.    . During the week, how often does your blood glucose drop below 70? Never  . Are you checking your feet daily/regularly? Yes, she stated she checks her feet every day.   Adherence Review: Is the patient currently on a STATIN medication? No Is the patient currently on ACE/ARB medication? No Does the patient have >5 day gap between last estimated fill dates? CPP to review  Star Rating Drugs:  Medication:  Last Fill: Day Supply None listed    [name], [facility] Clinical Pharmacist Assistant [phone]

## 2020-11-01 DIAGNOSIS — H40053 Ocular hypertension, bilateral: Secondary | ICD-10-CM | POA: Diagnosis not present

## 2020-11-09 ENCOUNTER — Telehealth: Payer: Self-pay

## 2020-11-09 NOTE — Telephone Encounter (Signed)
Patient made aware.

## 2020-11-09 NOTE — Telephone Encounter (Signed)
Patient assistance for toujeo received today and placed in the fridge. Patient has not been notified.

## 2020-11-17 DIAGNOSIS — H40053 Ocular hypertension, bilateral: Secondary | ICD-10-CM | POA: Diagnosis not present

## 2020-11-23 ENCOUNTER — Telehealth: Payer: Self-pay

## 2020-11-23 NOTE — Progress Notes (Signed)
Chronic Care Management Pharmacy Assistant   Name: RAYGEN LINQUIST  MRN: [id number] DOB: [birth year]   Reason for Encounter: Disease State for hypertension  Recent office visits:  11/03/20-ophthalmology, Patient called this morning upset after being prescribed Cosopt. She states it has an ingredient that she is allergic to. She states that she's having a change in vitals(decreased heart rate). She feels as though she's having flu like symptoms(freezing) and her throat is red, and has raised bumps. Patient has discontinued the medication. She states that she will also send Dr. Nadean Corwin a message via mychart.    11/01/20-ophthalmology, glaucoma evaluation  10/24/20-Dr. Cox PCP-UTI-start Doxycycline,   10/03/20-[facility] ophthalmology,  Raised intraocular pressure of both eyes, Botox injection for cervical dystonia   08/29/20-Sara Manson Passey, CPP  Recent consult visits:  none  Hospital visits:  None in previous 6 months  Medications: Outpatient Encounter Medications as of 11/23/2020  Medication Sig  . Alpha-Lipoic Acid 300 MG CAPS Take 300 mg by mouth daily.  . baclofen (LIORESAL) 10 MG tablet Take 10 mg by mouth 3 (three) times daily.  . Cholecalciferol (VITAMIN D) 50 MCG (2000 UT) tablet Take 2,000 Units by mouth daily.  . CONTOUR NEXT TEST test strip USE 1 STRIP TO CHECK GLUCOSE TWICE DAILY  . Cyanocobalamin 1000 MCG TBCR Take 1 tablet by mouth daily.  . D-Mannose POWD Take by mouth.  . doxycycline (VIBRA-TABS) 100 MG tablet Take 1 tablet (100 mg total) by mouth 2 (two) times daily.  . Dulaglutide (TRULICITY) 0.75 MG/0.5ML SOPN Inject 0.75 mg into the skin once a week.  . fluconazole (DIFLUCAN) 150 MG tablet TAKE 1 TABLET BY MOUTH EVERY DAY (Patient not taking: Reported on 08/29/2020)  . glucose blood test strip 1 each by Other route 3 (three) times daily as needed. Use as instructed  . hydrOXYzine (ATARAX) 10 MG/5ML syrup Take by mouth.  . hydrOXYzine (VISTARIL) 50 MG capsule Take by mouth  daily as needed.  Marland Kitchen ipratropium-albuterol (DUONEB) 0.5-2.5 (3) MG/3ML SOLN Inhale 3 mLs into the lungs every 6 (six) hours as needed.   . Lancets MISC by Does not apply route.  Marland Kitchen NOVOLOG FLEXPEN 100 UNIT/ML FlexPen Inject into the skin daily. Patient uses slidding scale. Maximum 30 units uses only blood sugar >250mg /dl.  Using 8-10 units when sugars are high lately.  . pyridOXINE (VITAMIN B-6) 50 MG tablet Take 25 mg by mouth daily. Takes the p5pb6 version 25 mg daily  . Sodium Chloride-Sodium Bicarb (NETI POT SINUS WASH NA) Place into the nose as needed.  [name] SOLOSTAR 300 UNIT/ML Solostar Pen Inject 23 Units into the skin daily. (Patient taking differently: Inject 20 Units into the skin in the morning and at bedtime.)  . TRIAMCINOLONE ACETONIDE, TOP, 0.05 % OINT Apply topically daily as needed. Uses as needed for rash, bug bites or on gums before dentist.  . Zinc 25 MG TABS Take by mouth. (Patient not taking: Reported on 08/29/2020)  . zinc gluconate 50 MG tablet Take 50 mg by mouth daily. Takes Monday-Friday.   No facility-administered encounter medications on file as of 11/23/2020.     Recent Office Vitals: BP Readings from Last 3 Encounters:  10/24/20 136/80  08/26/20 120/82  08/08/20 (!) 144/80   Pulse Readings from Last 3 Encounters:  10/24/20 88  08/26/20 82  08/08/20 83    Wt Readings from Last 3 Encounters:  10/24/20 201 lb (91.2 kg)  08/26/20 201 lb (91.2 kg)  08/08/20 200 lb (90.7 kg)  Kidney Function Lab Results  Component Value Date/Time   CREATININE 0.96 08/26/2020 08:18 AM   CREATININE 0.91 05/17/2020 09:21 AM   GFRNONAA 67 05/17/2020 09:21 AM   GFRAA 78 05/17/2020 09:21 AM    BMP Latest Ref Rng & Units 08/26/2020 05/17/2020 01/15/2020  Glucose 65 - 99 mg/dL 681(L) 94 572(I)  BUN 8 - 27 mg/dL 13 11 13   Creatinine 0.57 - 1.00 mg/dL 2.03 5.59  BUN/Creat Ratio 12 - 28 14 12 15   Sodium 134 - 144 mmol/L 141 144 141  Potassium 3.5 - 5.2 mmol/L 4.2 4.1  4.2  Chloride 96 - 106 mmol/L 102 106 103  CO2 20 - 29 mmol/L 24 25 27   Calcium 8.7 - 10.3 mg/dL 9.7 9.5 9.5     . Current antihypertensive regimen:  Diet and Lifestyle Medications previously tried: lisinopril, hydrochlorothiazide    . How often are you checking your Blood Pressure? infrequently   . Current home BP readings: Patient stated her readings are usually 120/68 to 120/78, she does not check it on a regular basis  . Wrist or arm cuff: Patient does not check it . Caffeine intake: She drinks coffee and tea on occasion  . Salt intake: She does not add salt  . OTC medications including pseudoephedrine or NSAIDs? She only uses Ibuprofen when needed due to allergies  . Any readings above 180/120? No   . What recent interventions/DTPs have been made by any provider to improve Blood Pressure control since last CPP Visit: Patient can not take certain medications due to her allergies and neurological reactions.  She stated her blood pressure only goes up when she is sick, or having a health issue.  She stated she has to be extremely careful with medications.  Her eye doctor stated she does not need glaucoma drops her pressures are normal after reaction she recently had.  She also can not take beta blockers.    . What diet changes have been made to improve Blood Pressure Control?  Patient watches her diet, she has lost 66 pounds   . What exercise is being done to improve your Blood Pressure Control?  Patient goes to [facility] 3-5 days per week, she has to do non weight bearing due to neurological issues  Adherence Review: Is the patient currently on ACE/ARB medication? No Does the patient have >5 day gap between last estimated fill dates? CPP to review   Star Rating Drugs:  Medication:  Last Fill: Day Supply None listed   , [facility] Clinical Pharmacist Assistant [phone]

## 2020-12-27 NOTE — Progress Notes (Signed)
Subjective:  Patient ID: Suzanne Hardin, female    DOB: October 10, 1955  Age: 65 y.o. MRN: [id number]  Chief Complaint  Patient presents with   Diabetes   Hypertension     HPI Suzanne Hardin is a 65 year old Caucasian female that is here for follow-up of type 2 diabetes mellitus and hypertension. She tells me that she has a recurrent umbilical hernia. She has declined referral to general surgeon at this time. She has multiple medication allergies that makes her a high-risk candidate for anesthesia. States she has experienced post-op complications related to allergic reactions related to anesthesia medications. Suzanne Hardin states she would like to work on core abdominal exercises prior to referral to general surgeon for possible repair.   Type 2 Diabetes Mellitus, follow-up Suzanne Hardin L [place] has a history of type 2 Diabetes Mellitus for several years. Current treatment includes:Trulicity 0.75 mg weekly, Toujeo 23 Units daily, and Novolog sliding scale.  She denies hypoglycemic episodes. She states blood glucose levels at home range from 108 to 120's. She had one elevated outlier blood glucose of 260 after eating a dessert late at night. She  is consuming a heart healthy diet and exercising regularly. She performs diabetic foot checks daily after showering.  Lab Results  Component Value Date   HGBA1C 7.7 (H) 08/26/2020   HGBA1C 7.5 (H) 05/17/2020   HGBA1C 9.3 (H) 09/16/2019    Hypertension, follow-up  She was last seen for hypertension 3 months ago.  BP at that visit was 120/82. Management since that visit includes low salt diet and exercise.  She reports good compliance with treatment. She is not having side effects.  She is following a Low Sodium diet. She is exercising. She does not smoke.  Use of agents associated with hypertension: none.   Outside blood pressures are not being checked. Symptoms: No chest pain No chest pressure  No palpitations No syncope  No dyspnea No orthopnea  No  paroxysmal nocturnal dyspnea No lower extremity edema   Pertinent labs: Lab Results  Component Value Date   CHOL 182 05/17/2020   HDL 57 05/17/2020   LDLCALC 114 (H) 05/17/2020   TRIG 58 05/17/2020   CHOLHDL 3.2 05/17/2020   Lab Results  Component Value Date   NA 141 08/26/2020   K 4.2 08/26/2020   CREATININE 0.96 08/26/2020   GFRNONAA 67 05/17/2020   GFRAA 78 05/17/2020   GLUCOSE 168 (H) 08/26/2020     The 10-year ASCVD risk score Denman Suzanne(Hardin DC Jr., et al., 2013) is: 8.9%         Current Outpatient Medications on File Prior to Visit  Medication Sig Dispense Refill   Alpha-Lipoic Acid 300 MG CAPS Take 300 mg by mouth daily.     baclofen (LIORESAL) 10 MG tablet Take 10 mg by mouth 3 (three) times daily.     Cholecalciferol (VITAMIN D) 50 MCG (2000 UT) tablet Take 2,000 Units by mouth daily.     CONTOUR NEXT TEST test strip USE 1 STRIP TO CHECK GLUCOSE TWICE DAILY 100 each 6   Cyanocobalamin 1000 MCG TBCR Take 1 tablet by mouth daily.     D-Mannose POWD Take by mouth.     doxycycline (VIBRA-TABS) 100 MG tablet Take 1 tablet (100 mg total) by mouth 2 (two) times daily. 14 tablet 0   Dulaglutide (TRULICITY) 0.75 MG/0.5ML SOPN Inject 0.75 mg into the skin once a week. 2 mL 6   fluconazole (DIFLUCAN) 150 MG tablet TAKE 1 TABLET BY MOUTH EVERY DAY (Patient  not taking: Reported on 08/29/2020) 7 tablet 0   glucose blood test strip 1 each by Other route 3 (three) times daily as needed. Use as instructed     hydrOXYzine (ATARAX) 10 MG/5ML syrup Take by mouth.     hydrOXYzine (VISTARIL) 50 MG capsule Take by mouth daily as needed.     ipratropium-albuterol (DUONEB) 0.5-2.5 (3) MG/3ML SOLN Inhale 3 mLs into the lungs every 6 (six) hours as needed.      Lancets MISC by Does not apply route.     NOVOLOG FLEXPEN 100 UNIT/ML FlexPen Inject into the skin daily. Patient uses slidding scale. Maximum 30 units uses only blood sugar >250mg /dl.  Using 8-10 units when sugars are high lately.      pyridOXINE (VITAMIN B-6) 50 MG tablet Take 25 mg by mouth daily. Takes the p5pb6 version 25 mg daily     Sodium Chloride-Sodium Bicarb (NETI POT SINUS WASH NA) Place into the nose as needed.     TOUJEO SOLOSTAR 300 UNIT/ML Solostar Pen Inject 23 Units into the skin daily. (Patient taking differently: Inject 20 Units into the skin in the morning and at bedtime.) 6 pen 6   TRIAMCINOLONE ACETONIDE, TOP, 0.05 % OINT Apply topically daily as needed. Uses as needed for rash, bug bites or on gums before dentist.     Zinc 25 MG TABS Take by mouth. (Patient not taking: Reported on 08/29/2020)     zinc gluconate 50 MG tablet Take 50 mg by mouth daily. Takes Monday-Friday.     No current facility-administered medications on file prior to visit.   Past Medical History:  Diagnosis Date   Cervical dystonia 04/03/2017   Cystitis    Essential hypertension 09/16/2019   Hypertension    Moderate persistent asthma 09/16/2019   Sciatica of right side    Type 2 diabetes mellitus with other specified complication (HCC) 09/16/2019   Past Surgical History:  Procedure Laterality Date   APPENDECTOMY  2000   CATARACT EXTRACTION, BILATERAL     CHOLECYSTECTOMY  2000   TONSILLECTOMY AND ADENOIDECTOMY      Family History  Problem Relation Age of Onset   Ulcers Father    Peripheral Artery Disease Father    Social History   Socioeconomic History   Marital status: Divorced    Spouse name: Not on file   Number of children: 2   Years of education: Not on file   Highest education level: Not on file  Occupational History   Occupation: Disabled  Tobacco Use   Smoking status: Never   Smokeless tobacco: Never  Vaping Use   Vaping Use: Never used  Substance and Sexual Activity   Alcohol use: Never   Drug use: Never   Sexual activity: Yes    Partners: Male  Other Topics Concern   Not on file  Social History Narrative   Not on file   Social Determinants of Health   Financial Resource Strain: Not on file   Food Insecurity: No Food Insecurity   Worried About Running Out of Food in the Last Year: Never true   Ran Out of Food in the Last Year: Never true  Transportation Needs: No Transportation Needs   Lack of Transportation (Medical): No   Lack of Transportation (Non-Medical): No  Physical Activity: Sufficiently Active   Days of Exercise per Week: 5 days   Minutes of Exercise per Session: 30 min  Stress: Not on file  Social Connections: Not on file    Review  of Systems  Constitutional:  Negative for appetite change, fatigue and fever.  HENT:  Negative for congestion, ear pain, sinus pressure and sore throat.   Eyes:  Negative for pain.  Respiratory:  Negative for cough, chest tightness, shortness of breath and wheezing.   Cardiovascular:  Negative for chest pain and palpitations.  Gastrointestinal:  Negative for abdominal pain, constipation, diarrhea, nausea and vomiting.  Genitourinary:  Negative for dysuria and hematuria.  Musculoskeletal:  Negative for arthralgias, back pain, joint swelling and myalgias.  Skin:  Negative for rash.  Neurological:  Negative for dizziness, weakness and headaches.  Psychiatric/Behavioral:  Negative for dysphoric mood. The patient is not nervous/anxious.     Objective:  BP (!) 152/82 (BP Location: Left Arm, Patient Position: Sitting)   Pulse 76   Temp (!) 97.4 F (36.3 C) (Temporal)   Ht 5\' 7"  (1.702 m)   Wt 202 lb (91.6 kg)   SpO2 98%   BMI 31.64 kg/m    BP/Weight 10/24/2020 08/26/2020 08/08/2020  Systolic BP 136 120 144  Diastolic BP 80 82 80  Wt. (Lbs) 201 201 200  BMI 31.96 31.96 32.28    Physical Exam Vitals reviewed.  Constitutional:      Appearance: Normal appearance.  HENT:     Head: Normocephalic.     Right Ear: Tympanic membrane normal.     Left Ear: Tympanic membrane normal.     Nose: Nose normal.     Mouth/Throat:     Mouth: Mucous membranes are moist.  Cardiovascular:     Rate and Rhythm: Normal rate and regular rhythm.      Pulses: Normal pulses.          Dorsalis pedis pulses are 2+ on the right side and 2+ on the left side.       Posterior tibial pulses are 2+ on the right side and 2+ on the left side.     Heart sounds: Normal heart sounds.  Pulmonary:     Effort: Pulmonary effort is normal.     Breath sounds: Normal breath sounds.  Abdominal:     General: Bowel sounds are normal.     Palpations: Abdomen is soft.     Hernia: A hernia (umbilical, small) is present.  Musculoskeletal:        General: Normal range of motion.     Cervical back: Normal range of motion.  Feet:     Right foot:     Protective Sensation: 6 sites tested.  6 sites sensed.     Skin integrity: Skin integrity normal.     Toenail Condition: Right toenails are normal.     Left foot:     Protective Sensation: 6 sites tested.  6 sites sensed.     Skin integrity: Skin integrity normal.     Toenail Condition: Left toenails are normal.  Skin:    General: Skin is warm and dry.     Capillary Refill: Capillary refill takes less than 2 seconds.  Neurological:     General: No focal deficit present.     Mental Status: She is alert and oriented to person, place, and time.  Psychiatric:        Mood and Affect: Mood normal.        Behavior: Behavior normal.        Thought Content: Thought content normal.        Judgment: Judgment normal.    Lab Results  Component Value Date   WBC 8.3 08/26/2020  HGB 14.8 08/26/2020   HCT 44.4 08/26/2020   PLT 311 08/26/2020   GLUCOSE 168 (H) 08/26/2020   CHOL 182 05/17/2020   TRIG 58 05/17/2020   HDL 57 05/17/2020   LDLCALC 114 (H) 05/17/2020   ALT 26 08/26/2020   AST 28 08/26/2020   NA 141 08/26/2020   K 4.2 08/26/2020   CL 102 08/26/2020   CREATININE 0.96 08/26/2020   BUN 13 08/26/2020   CO2 24 08/26/2020   TSH 1.760 01/15/2020   HGBA1C 7.7 (H) 08/26/2020   MICROALBUR 80 01/15/2020      Assessment & Plan:     1. Type 2 diabetes mellitus with other specified complication, with  long-term current use of insulin (HCC)-not at goal - Lipid panel - Hemoglobin A1c - POCT UA - Microalbumin - Thyroid Panel With TSH -continue Toujeo and Trulicity as prescribed   2. Essential hypertension-not at goal - CBC With Diff/Platelet - Comprehensive metabolic panel - Thyroid Panel With TSH -Continue heart healthy diet -Monitor BP at homes  3. Moderate persistent asthma, unspecified whether complicated-well controlled -continue Duoneb    4. Encounter for screening for malignant neoplasm of colon - Cologuard    Continue medications Monitor BP and blood sugars We will order Cologuard for colon cancer screening Follow-up in 7-months fasting with Dr Marina Goodell     Follow-up: 51-months, fasting with Dr Marina Goodell  An After Visit Summary was printed and given to the patient.   I,Lauren M Auman,acting as a Neurosurgeon for [employer], NP.,have documented all relevant documentation on the behalf of Janie Morning, NP,as directed by  Janie Morning, NP while in the presence of Janie Morning, NP.   I, Janie Morning, NP, have reviewed all documentation for this visit. The documentation on 12/28/20 for the exam, diagnosis, procedures, and orders are all accurate and complete.   Janie Morning, NP [facility] [phone]

## 2020-12-28 ENCOUNTER — Other Ambulatory Visit: Payer: Self-pay

## 2020-12-28 ENCOUNTER — Encounter: Payer: Self-pay | Admitting: Nurse Practitioner

## 2020-12-28 ENCOUNTER — Ambulatory Visit: Payer: Medicare Other | Admitting: Legal Medicine

## 2020-12-28 ENCOUNTER — Ambulatory Visit (INDEPENDENT_AMBULATORY_CARE_PROVIDER_SITE_OTHER): Payer: Medicare Other | Admitting: Nurse Practitioner

## 2020-12-28 VITALS — BP 152/82 | HR 76 | Temp 97.4°F | Ht 67.0 in | Wt 202.0 lb

## 2020-12-28 DIAGNOSIS — Z1211 Encounter for screening for malignant neoplasm of colon: Secondary | ICD-10-CM | POA: Diagnosis not present

## 2020-12-28 DIAGNOSIS — E782 Mixed hyperlipidemia: Secondary | ICD-10-CM | POA: Diagnosis not present

## 2020-12-28 DIAGNOSIS — Z794 Long term (current) use of insulin: Secondary | ICD-10-CM | POA: Diagnosis not present

## 2020-12-28 DIAGNOSIS — E1169 Type 2 diabetes mellitus with other specified complication: Secondary | ICD-10-CM | POA: Diagnosis not present

## 2020-12-28 DIAGNOSIS — I10 Essential (primary) hypertension: Secondary | ICD-10-CM | POA: Diagnosis not present

## 2020-12-28 DIAGNOSIS — J4540 Moderate persistent asthma, uncomplicated: Secondary | ICD-10-CM

## 2020-12-28 LAB — POCT UA - MICROALBUMIN: Microalbumin Ur, POC: 80 mg/L

## 2020-12-28 NOTE — Patient Instructions (Addendum)
Continue medications Monitor BP and blood sugars We will order Cologuard for colon cancer screening Follow-up in 58-months fasting with Dr Marina Goodell  Managing Your Hypertension Hypertension, also called high blood pressure, is when the force of the blood pressing against the walls of the arteries is too strong. Arteries are blood vessels that carry blood from your heart throughout your body. Hypertension forces the heart to work harder to pump blood and may cause the arteries tobecome narrow or stiff. Understanding blood pressure readings Your personal target blood pressure may vary depending on your medical conditions, your age, and other factors. A blood pressure reading includes a higher number over a lower number. Ideally, your blood pressure should be below 120/80. You should know that: The first, or top, number is called the systolic pressure. It is a measure of the pressure in your arteries as your heart beats. The second, or bottom number, is called the diastolic pressure. It is a measure of the pressure in your arteries as the heart relaxes. Blood pressure is classified into four stages. Based on your blood pressure reading, your health care provider may use the following stages to determine what type of treatment you need, if any. Systolic pressure and diastolicpressure are measured in a unit called mmHg. Normal Systolic pressure: below 120. Diastolic pressure: below 80. Elevated Systolic pressure: 120-129. Diastolic pressure: below 80. Hypertension stage 1 Systolic pressure: 130-139. Diastolic pressure: 80-89. Hypertension stage 2 Systolic pressure: 140 or above. Diastolic pressure: 90 or above. How can this condition affect me? Managing your hypertension is an important responsibility. Over time, hypertension can damage the arteries and decrease blood flow to important parts of the body, including the brain, heart, and kidneys. Having untreated or uncontrolled hypertension can lead  to: A heart attack. A stroke. A weakened blood vessel (aneurysm). Heart failure. Kidney damage. Eye damage. Metabolic syndrome. Memory and concentration problems. Vascular dementia. What actions can I take to manage this condition? Hypertension can be managed by making lifestyle changes and possibly by taking medicines. Your health care provider will help you make a plan to bring yourblood pressure within a normal range. Nutrition  Eat a diet that is high in fiber and potassium, and low in salt (sodium), added sugar, and fat. An example eating plan is called the Dietary Approaches to Stop Hypertension (DASH) diet. To eat this way: Eat plenty of fresh fruits and vegetables. Try to fill one-half of your plate at each meal with fruits and vegetables. Eat whole grains, such as whole-wheat pasta, brown rice, or whole-grain bread. Fill about one-fourth of your plate with whole grains. Eat low-fat dairy products. Avoid fatty cuts of meat, processed or cured meats, and poultry with skin. Fill about one-fourth of your plate with lean proteins such as fish, chicken without skin, beans, eggs, and tofu. Avoid pre-made and processed foods. These tend to be higher in sodium, added sugar, and fat. Reduce your daily sodium intake. Most people with hypertension should eat less than 1,500 mg of sodium a day.  Lifestyle  Work with your health care provider to maintain a healthy body weight or to lose weight. Ask what an ideal weight is for you. Get at least 30 minutes of exercise that causes your heart to beat faster (aerobic exercise) most days of the week. Activities may include walking, swimming, or biking. Include exercise to strengthen your muscles (resistance exercise), such as weight lifting, as part of your weekly exercise routine. Try to do these types of exercises for 30  minutes at least 3 days a week. Do not use any products that contain nicotine or tobacco, such as cigarettes, e-cigarettes, and  chewing tobacco. If you need help quitting, ask your health care provider. Control any long-term (chronic) conditions you have, such as high cholesterol or diabetes. Identify your sources of stress and find ways to manage stress. This may include meditation, deep breathing, or making time for fun activities.  Alcohol use Do not drink alcohol if: Your health care provider tells you not to drink. You are pregnant, may be pregnant, or are planning to become pregnant. If you drink alcohol: Limit how much you use to: 0-1 drink a day for women. 0-2 drinks a day for men. Be aware of how much alcohol is in your drink. In the [place] one drink equals one 12 oz bottle of beer (355 mL), one 5 oz glass of wine (148 mL), or one 1 oz glass of hard liquor (44 mL). Medicines Your health care provider may prescribe medicine if lifestyle changes are not enough to get your blood pressure under control and if: Your systolic blood pressure is 130 or higher. Your diastolic blood pressure is 80 or higher. Take medicines only as told by your health care provider. Follow the directions carefully. Blood pressure medicines must be taken as told by your health care provider. The medicine does not work as well when you skip doses. Skippingdoses also puts you at risk for problems. Monitoring Before you monitor your blood pressure: Do not smoke, drink caffeinated beverages, or exercise within 30 minutes before taking a measurement. Use the bathroom and empty your bladder (urinate). Sit quietly for at least 5 minutes before taking measurements. Monitor your blood pressure at home as told by your health care provider. To do this: Sit with your back straight and supported. Place your feet flat on the floor. Do not cross your legs. Support your arm on a flat surface, such as a table. Make sure your upper arm is at heart level. Each time you measure, take two or three readings one minute apart and record the results. You may  also need to have your blood pressure checked regularly by your healthcare provider. General information Talk with your health care provider about your diet, exercise habits, and other lifestyle factors that may be contributing to hypertension. Review all the medicines you take with your health care provider because there may be side effects or interactions. Keep all visits as told by your health care provider. Your health care provider can help you create and adjust your plan for managing your high blood pressure. Where to find more information National Heart, Lung, and Blood Institute: PopSteam.iswww.nhlbi.nih.gov American Heart Association: www.heart.org Contact a health care provider if: You think you are having a reaction to medicines you have taken. You have repeated (recurrent) headaches. You feel dizzy. You have swelling in your ankles. You have trouble with your vision. Get help right away if: You develop a severe headache or confusion. You have unusual weakness or numbness, or you feel faint. You have severe pain in your chest or abdomen. You vomit repeatedly. You have trouble breathing. These symptoms may represent a serious problem that is an emergency. Do not wait to see if the symptoms will go away. Get medical help right away. Call your local emergency services (911 in the [place] Do not drive yourself to the hospital. Summary Hypertension is when the force of blood pumping through your arteries is too strong. If this condition is  not controlled, it may put you at risk for serious complications. Your personal target blood pressure may vary depending on your medical conditions, your age, and other factors. For most people, a normal blood pressure is less than 120/80. Hypertension is managed by lifestyle changes, medicines, or both. Lifestyle changes to help manage hypertension include losing weight, eating a healthy, low-sodium diet, exercising more, stopping smoking, and limiting  alcohol. This information is not intended to replace advice given to you by your health care provider. Make sure you discuss any questions you have with your healthcare provider. Document Revised: 07/17/2019 Document Reviewed: 05/12/2019 Elsevier Patient Education  2022 [employer] Diabetes Mellitus and Foot Care Foot care is an important part of your health, especially when you have diabetes. Diabetes may cause you to have problems because of poor blood flow (circulation) to your feet and legs, which can cause your skin to: Become thinner and drier. Break more easily. Heal more slowly. Peel and crack. You may also have nerve damage (neuropathy) in your legs and feet, causing decreased feeling in them. This means that you may not notice minor injuries to your feet that could lead to more serious problems. Noticing and addressing any potential problems early is the best wayto prevent future foot problems. How to care for your feet Foot hygiene  Wash your feet daily with warm water and mild soap. Do not use hot water. Then, pat your feet and the areas between your toes until they are completely dry. Do not soak your feet as this can dry your skin. Trim your toenails straight across. Do not dig under them or around the cuticle. File the edges of your nails with an emery board or nail file. Apply a moisturizing lotion or petroleum jelly to the skin on your feet and to dry, brittle toenails. Use lotion that does not contain alcohol and is unscented. Do not apply lotion between your toes.  Shoes and socks Wear clean socks or stockings every day. Make sure they are not too tight. Do not wear knee-high stockings since they may decrease blood flow to your legs. Wear shoes that fit properly and have enough cushioning. Always look in your shoes before you put them on to be sure there are no objects inside. To break in new shoes, wear them for just a few hours a day. This prevents injuries on your  feet. Wounds, scrapes, corns, and calluses  Check your feet daily for blisters, cuts, bruises, sores, and redness. If you cannot see the bottom of your feet, use a mirror or ask someone for help. Do not cut corns or calluses or try to remove them with medicine. If you find a minor scrape, cut, or break in the skin on your feet, keep it and the skin around it clean and dry. You may clean these areas with mild soap and water. Do not clean the area with peroxide, alcohol, or iodine. If you have a wound, scrape, corn, or callus on your foot, look at it several times a day to make sure it is healing and not infected. Check for: Redness, swelling, or pain. Fluid or blood. Warmth. Pus or a bad smell.  General tips Do not cross your legs. This may decrease blood flow to your feet. Do not use heating pads or hot water bottles on your feet. They may burn your skin. If you have lost feeling in your feet or legs, you may not know this is happening until it is too late.  Protect your feet from hot and cold by wearing shoes, such as at the beach or on hot pavement. Schedule a complete foot exam at least once a year (annually) or more often if you have foot problems. Report any cuts, sores, or bruises to your health care provider immediately. Where to find more information American Diabetes Association: www.diabetes.org Association of Diabetes Care & Education Specialists: www.diabeteseducator.org Contact a health care provider if: You have a medical condition that increases your risk of infection and you have any cuts, sores, or bruises on your feet. You have an injury that is not healing. You have redness on your legs or feet. You feel burning or tingling in your legs or feet. You have pain or cramps in your legs and feet. Your legs or feet are numb. Your feet always feel cold. You have pain around any toenails. Get help right away if: You have a wound, scrape, corn, or callus on your foot and: You  have pain, swelling, or redness that gets worse. You have fluid or blood coming from the wound, scrape, corn, or callus. Your wound, scrape, corn, or callus feels warm to the touch. You have pus or a bad smell coming from the wound, scrape, corn, or callus. You have a fever. You have a red line going up your leg. Summary Check your feet every day for blisters, cuts, bruises, sores, and redness. Apply a moisturizing lotion or petroleum jelly to the skin on your feet and to dry, brittle toenails. Wear shoes that fit properly and have enough cushioning. If you have foot problems, report any cuts, sores, or bruises to your health care provider immediately. Schedule a complete foot exam at least once a year (annually) or more often if you have foot problems. This information is not intended to replace advice given to you by your health care provider. Make sure you discuss any questions you have with your healthcare provider. Document Revised: 12/31/2019 Document Reviewed: 12/31/2019 Elsevier Patient Education  2022 Elsevier Inc. https://www.cancer.gov/types/colorectal/patient/colorectal-prevention-pdq#section/_4">  Preventing Colorectal Cancer Colorectal cancer is a cancerous (malignant) tumor in the colon or rectum, which are parts of the large intestine. A tumor is a mass of cells or tissue. If this cancer is not found early, it can spread to other parts of the body and can be life-threatening. It is one of the most commonly diagnosed types of cancer and a leading cause of death related tocancer. Colorectal cancer cannot always be prevented, but you can take steps to loweryour risk of developing it. How can this condition affect me? Most often, colorectal cancer starts as abnormal growths called polyps on the inner wall of the colon or rectum. Left untreated, these polyps can develop into cancer. You can help prevent colorectal cancer by having screening tests to check for these early signs of  cancer. During screening, polyps may be removed to be checked for cancer cells and possibly to prevent them from becoming cancerous. If you delay recommended screenings, it is possible thatany existing cancer will grow and spread before it is found. Along with regular screenings, making changes to your diet and lifestyle canhelp prevent colorectal cancer. What can increase my risk? Certain factors may make you more likely to develop this condition. Risk factors that can be changed Being inactive (sedentary). Being overweight or obese. Having a diet that: Is high in red meat, precooked or cured meat, or other processed meat, and high in fat (especially animal fat). Is low in sources of fiber, such as  fruits, vegetables, and whole grains. Includes large amounts of sugar-sweetened beverages. Drinking too much alcohol. Smoking. Risk factors that cannot be changed Being older than age 45. Having a personal or family history of colorectal cancer or polyps in your colon. Having a type of genetic syndrome that is passed from parent to child (hereditary), such as: Lynch syndrome. Familial adenomatous polyposis. Turcot syndrome. Peutz-Jeghers syndrome. MUTYH-associated polyposis (MAP). Having certain other conditions, such as: Inflammatory bowel disease. This includes ulcerative colitis or Crohn's disease. Diabetes. A history of cancer that was treated with radiation to the abdomen or the area between the hip bones (pelvic area). What actions can I take to prevent this? Nutrition  Eat plenty of fruits and vegetables. Try to eat at least: 1-2 cups of fruit each day. 2-3 cups of vegetables each day. Eat whole grains, which are grains that have not been processed. They include oats, whole wheat, bulgur, brown rice, quinoa, and millet. You should eat 6-8 oz (171-227 g) of grains each day. Use a kitchen scale to measure these amounts. Eat less red meat. Instead, choose lean sources of protein,  such as beans, tofu, fish, and chicken. Avoid precooked or cured meat, such as sausages or meat loaves. Avoid frying and cooking meat at high heat. Use other methods of cooking, such as steaming or sauting.  Lifestyle Do not use any products that contain nicotine or tobacco, such as cigarettes, e-cigarettes, and chewing tobacco. If you need help quitting, ask your health care provider. If you drink alcohol: Limit how much you use to: 0-1 drink a day for women who are not pregnant. 0-2 drinks a day for men. Be aware of how much alcohol is in your drink. In the [place] one drink equals one 12 oz bottle of beer (355 mL), one 5 oz glass of wine (148 mL), or one 1 oz glass of hard liquor (44 mL). If you are overweight or obese, work with your health care provider to lose weight. Exercise. Each week, aim for at least 150 minutes of moderate exercise (such as walking, biking, or yoga) or 75 minutes of vigorous exercise (such as running or swimming). Ask your health care provider how much physical activity is best for you. Getting screened Have regular screenings as often as recommended by your health care provider. All adults should have screenings starting at age 19 and continuing until age 38. Your health care provider may recommend screening before age 67. You will have tests every 1-10 years, depending on your results and the type of screening test. People at increased risk should start screening at an earlier age. There are several types of screening tests, such as: Tests to check a stool (feces) sample for blood or for DNA changes that could lead to cancer. Sigmoidoscopy. During this test, a flexible tube with a tiny camera (sigmoidoscope) is inserted through the opening between the buttocks (anus) and is used to examine the rectum and lower colon. Colonoscopy. During this test, a long, thin, flexible tube with a tiny camera (colonoscope) is used to examine the entire colon and rectum. Virtual  colonoscopy. Instead of a colonoscope, this type of colonoscopy uses a CT scan and computers to take pictures of the colon and rectum. General information Ask your health care provider if you should take low-dose aspirin to help prevent polyps. Find out about your family's medical history. It is important to know whether colorectal cancer is in your family. If you have a family history of colorectal cancer, ask  to meet with a Dentist. Where to find support Talk with your health care provider about screenings, a healthy diet, and proper exercise. If you need help to quit smoking or using tobacco, talk to your health care provider or visit these websites: [employer]Casinos.ch: smokefree.gov [place] Department of Health and Human Services: TalkAbuse.com.cy Where to find more information American Cancer Society: cancer.org National Cancer Institute: cancer.gov Contact a health care provider if you have: Blood in your stool or in the toilet after a bowel movement. Discomfort, pain, bloating, fullness, or cramps in your abdomen. A change in your bowel habits. Unexplained weight loss. Nausea and vomiting. Constant tiredness (fatigue). Summary You can reduce your chances of getting colorectal cancer by getting recommended screenings and making certain diet and lifestyle changes. Eat plenty of fruits, vegetables, and whole grains. Avoid red meats and processed meats. Do not use any products that contain nicotine or tobacco, such as cigarettes, e-cigarettes, and chewing tobacco. Aim for at least 150 minutes of moderate exercise or 75 minutes of vigorous exercise each week. Ask your health care provider how much activity is best for you. Get regular screenings as often as recommended by your health care provider. Most people should start having colonoscopies at age 5. This information is not intended to replace advice given to you by your health care provider. Make sure you discuss any questions  you have with your healthcare provider. Document Revised: 09/30/2019 Document Reviewed: 09/30/2019 Elsevier Patient Education  2022 [employer].

## 2020-12-29 LAB — CBC WITH DIFF/PLATELET
Basophils Absolute: 0.1 10*3/uL (ref 0.0–0.2)
Basos: 1 %
EOS (ABSOLUTE): 0.3 10*3/uL (ref 0.0–0.4)
Eos: 3 %
Hematocrit: 42 % (ref 34.0–46.6)
Hemoglobin: 14.5 g/dL (ref 11.1–15.9)
Immature Grans (Abs): 0 10*3/uL (ref 0.0–0.1)
Immature Granulocytes: 0 %
Lymphocytes Absolute: 3.1 10*3/uL (ref 0.7–3.1)
Lymphs: 40 %
MCH: 30.3 pg (ref 26.6–33.0)
MCHC: 34.5 g/dL (ref 31.5–35.7)
MCV: 88 fL (ref 79–97)
Monocytes Absolute: 0.4 10*3/uL (ref 0.1–0.9)
Monocytes: 5 %
Neutrophils Absolute: 4.1 10*3/uL (ref 1.4–7.0)
Neutrophils: 51 %
Platelets: 288 10*3/uL (ref 150–450)
RBC: 4.78 x10E6/uL (ref 3.77–5.28)
RDW: 12.7 % (ref 11.7–15.4)
WBC: 7.9 10*3/uL (ref 3.4–10.8)

## 2020-12-29 LAB — COMPREHENSIVE METABOLIC PANEL
ALT: 22 IU/L (ref 0–32)
AST: 27 IU/L (ref 0–40)
Albumin/Globulin Ratio: 2 (ref 1.2–2.2)
Albumin: 4.5 g/dL (ref 3.8–4.8)
Alkaline Phosphatase: 98 IU/L (ref 44–121)
BUN/Creatinine Ratio: 8 — ABNORMAL LOW (ref 12–28)
BUN: 7 mg/dL — ABNORMAL LOW (ref 8–27)
Bilirubin Total: 0.6 mg/dL (ref 0.0–1.2)
CO2: 25 mmol/L (ref 20–29)
Calcium: 9.6 mg/dL (ref 8.7–10.3)
Chloride: 104 mmol/L (ref 96–106)
Creatinine, Ser: 0.87 mg/dL (ref 0.57–1.00)
Globulin, Total: 2.2 g/dL (ref 1.5–4.5)
Glucose: 113 mg/dL — ABNORMAL HIGH (ref 65–99)
Potassium: 4.2 mmol/L (ref 3.5–5.2)
Sodium: 143 mmol/L (ref 134–144)
Total Protein: 6.7 g/dL (ref 6.0–8.5)
eGFR: 75 mL/min/{1.73_m2} (ref >59)

## 2020-12-29 LAB — LIPID PANEL
Chol/HDL Ratio: 3.7 ratio (ref 0.0–4.4)
Cholesterol, Total: 195 mg/dL (ref 100–199)
HDL: 53 mg/dL (ref >39)
LDL Chol Calc (NIH): 126 mg/dL — ABNORMAL HIGH (ref 0–99)
Triglycerides: 91 mg/dL (ref 0–149)
VLDL Cholesterol Cal: 16 mg/dL (ref 5–40)

## 2020-12-29 LAB — THYROID PANEL WITH TSH
Free Thyroxine Index: 2 (ref 1.2–4.9)
T3 Uptake Ratio: 25 % (ref 24–39)
T4, Total: 7.8 ug/dL (ref 4.5–12.0)
TSH: 2.56 u[IU]/mL (ref 0.450–4.500)

## 2020-12-29 LAB — HEMOGLOBIN A1C
Est. average glucose Bld gHb Est-mCnc: 166 mg/dL
Hgb A1c MFr Bld: 7.4 % — ABNORMAL HIGH (ref 4.8–5.6)

## 2020-12-29 LAB — CARDIOVASCULAR RISK ASSESSMENT

## 2021-01-05 ENCOUNTER — Ambulatory Visit (INDEPENDENT_AMBULATORY_CARE_PROVIDER_SITE_OTHER): Payer: Medicare Other

## 2021-01-05 ENCOUNTER — Other Ambulatory Visit: Payer: Self-pay

## 2021-01-05 DIAGNOSIS — E1169 Type 2 diabetes mellitus with other specified complication: Secondary | ICD-10-CM

## 2021-01-05 DIAGNOSIS — Z794 Long term (current) use of insulin: Secondary | ICD-10-CM

## 2021-01-05 DIAGNOSIS — E782 Mixed hyperlipidemia: Secondary | ICD-10-CM | POA: Diagnosis not present

## 2021-01-05 NOTE — Patient Instructions (Signed)
Visit Information   Goals Addressed             This Visit's Progress    Learn More About My Health   On track    Timeframe:  Long-Range Goal Priority:  High Start Date:              08/29/20                Expected End Date:  08/29/2021                      Follow Up Date 01/05/2021    - ask questions - repeat what I heard to make sure I understand - bring a list of my medicines to the visit - speak up when I don't understand    Why is this important?   The best way to learn about your health and care is by talking to the doctor and nurse.  They will answer your questions and give you information in the way that you like best.    Notes:      Manage My Medicine   On track    Timeframe:  Long-Range Goal Priority:  High Start Date:     08/29/20                         Expected End Date:  08/29/2020                     Follow Up Date 01/03/2022    - call for medicine refill 2 or 3 days before it runs out - keep a list of all the medicines I take; vitamins and herbals too - use a pillbox to sort medicine    Why is this important?   These steps will help you keep on track with your medicines.   Notes:      Monitor and Manage My Blood Sugar-Diabetes Type 2   On track    Timeframe:  Long-Range Goal Priority:  High Start Date:                             Expected End Date:                       Follow Up Date 01/03/2021    - check blood sugar at prescribed times - enter blood sugar readings and medication or insulin into daily log - take the blood sugar log to all doctor visits    Why is this important?   Checking your blood sugar at home helps to keep it from getting very high or very low.  Writing the results in a diary or log helps the doctor know how to care for you.  Your blood sugar log should have the time, date and the results.  Also, write down the amount of insulin or other medicine that you take.  Other information, like what you ate, exercise done  and how you were feeling, will also be helpful.     Notes:        Patient Care Plan: CCM Pharmacy Care Plan     Problem Identified: hld, dm, htn   Priority: High  Onset Date: 08/29/2020     Long-Range Goal: Disease Management   Start Date: 08/29/2020  Expected End Date: 08/29/2021  Recent Progress: On track  Priority: High  Note:  Pharmacist Clinical Goal(s):  Over the next 90 days, patient will verbalize ability to afford treatment regimen achieve control of diabetes as evidenced by a1c and blood glucose through collaboration with PharmD and provider.   Interventions: 1:1 collaboration with Abigail Miyamoto, MD regarding development and update of comprehensive plan of care as evidenced by provider attestation and co-signature Inter-disciplinary care team collaboration (see longitudinal plan of care) Comprehensive medication review performed; medication list updated in electronic medical record  Hypertension (BP goal <130/80) -Controlled -Current treatment: Diet and lifestyle -Medications previously tried: lisinopril, hydrochlorothiazide  -Current home readings: 115/60 -Current dietary habits: reports increased blood sugar readings from deviating from normal diet -Current exercise habits: pool exercise therapy daily -Denies hypotensive/hypertensive symptoms -Educated on BP goals and benefits of medications for prevention of heart attack, stroke and kidney damage; Daily salt intake goal < 2300 mg; Exercise goal of 150 minutes per week; Importance of home blood pressure monitoring; Symptoms of hypotension and importance of maintaining adequate hydration; -Counseled to monitor BP at home monthly, document, and provide log at future appointments -Counseled on diet and exercise extensively  Hyperlipidemia: (LDL goal < 70) -Not ideally controlled -Current treatment: Diet and lifestyle  -Medications previously tried: none reported -Current dietary patterns: has varied  from her normal a little more than normal but getting back on track.  -Current exercise habits: daily pool rehab exercise.  -Educated on Cholesterol goals;  Importance of limiting foods high in cholesterol; Exercise goal of 150 minutes per week; -Counseled on diet and exercise extensively  Diabetes (A1c goal <7%) -Not ideally controlled -Current medications: Trulicity 0.75 mg weekly  Novolog sliding scale before meals if needed - not using regularly Toujeo 20-23 units twice daily  -Medications previously tried: metformin, Victoza -Current home glucose readings fasting glucose: 100-160 mg/dL -Denies hypoglycemic/hyperglycemic symptoms -Current meal patterns:  Diet: states that she knows variations and her diet is why her a1c isn't better than it is  drinks: milk, water, sage tea, ginger tea -Current exercise: daily pool exercise for rehab -Educated onA1c and blood sugar goals; Complications of diabetes including kidney damage, retinal damage, and cardiovascular disease; Exercise goal of 150 minutes per week; Benefits of weight loss; Benefits of routine self-monitoring of blood sugar; Carbohydrate counting and/or plate method -Counseled to check feet daily and get yearly eye exams -Counseled on diet and exercise extensively Recommended continuing to monitor blood sugar and patient declines option of increasing Trulicity at this time. 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   Health Maintenance -Vaccine gaps: contraindications to flu or COVID -Current therapy:  cyanocobalamin 1000 mcg daily  Alpha-lipoic acid 300 mg daily  D-mannose powder  Vitamin D 2000 units daily  Zinc 50 mg daily Monday-Friday Vitamin b-6 25 mg daily  -Educated on Herbal supplement research is limited and benefits usually cannot be proven -Patient is satisfied with current therapy and denies issues -Counseled on diet and exercise extensively Recommended to  continue current medication   Patient Goals/Self-Care Activities Over the next 90 days, patient will:  - take medications as prescribed focus on medication adherence by using pill box check glucose 3-4 times daily , document, and provide at future appointments target a minimum of 150 minutes of moderate intensity exercise weekly Reduce carbohydrate intake and work to improve diet since COVID and stomach symptoms.   Follow Up Plan: Telephone follow up appointment with care management team member scheduled for: 5 months 05/2021      Patient verbalizes understanding  of instructions provided today and agrees to view in MyChart.  Telephone follow up appointment with pharmacy team member scheduled for: 05/2021 to update patient assistance applications  Earvin Hansen, [facility]

## 2021-01-05 NOTE — Progress Notes (Signed)
Chronic Care Management Pharmacy Note  01/05/2021 Name:  Suzanne Hardin MRN:  [id number] DOB:  24-Jul-1955  Plan Updates:  Lost 5 lbs per her scale. Reports using 23 units am and 20 units evening. Forgets evening dose due to falling asleep or not being home. Most days are  98-110 am fasting or 120-160 morning after she forgets evening dose. Has not had to use any rapid insulin in past 3 months. Patient is not interested increaseing Trulicity at this time. She feels that her blood sugar range currently is best for her neurological conditions.  Patient is intolerant/contraindicated to statin therapy. Please consider updating diagnosis to reflect.   Subjective: Suzanne Hardin is an 65 y.o. year old female who is a primary patient of Henrene Pastor, Zeb Comfort, MD.  The CCM team was consulted for assistance with disease management and care coordination needs.    Engaged with patient by telephone for follow up visit in response to provider referral for pharmacy case management and/or care coordination services.   Consent to Services:  The patient was given information about Chronic Care Management services, agreed to services, and gave verbal consent prior to initiation of services.  Please see initial visit note for detailed documentation.   Patient Care Team: Lillard Anes, MD as PCP - General (Family Medicine) Burnice Logan, [facility] as Pharmacist (Pharmacist)  Recent office visits: 12/28/2020 - cologuard ordered. Monitor bp and blood sugars. Thyroid panel normal. CBC normal. CMP stable. A1c 7.4%. Lipid panel: LDL elevated 126.   11/01/20-ophthalmology, glaucoma evaluation   10/24/20-Dr. Cox PCP-UTI-start Doxycycline,   08/26/2020 - Cbc all normal, glucose 168, kidney and liver tests normal, A1c 7.7 good  08/08/2020 - acute cystitis with hematuria. E. Coli sensitive to tetracycline. Fluconazole prescribed.   06/21/2020 - abnormal urialysis and urine culture. Cirpo and fluconazole  given.   Recent consult visits:  11/03/20-ophthalmology, Patient called this morning upset after being prescribed Cosopt. She states it has an ingredient that she is allergic to. She states that she's having a change in vitals(decreased heart rate). She feels as though she's having flu like symptoms(freezing) and her throat is red, and has raised bumps. Patient has discontinued the medication. She states that she will also send Dr. Pennie Banter a message via mychart.    10/03/20-[facility] ophthalmology,  Raised intraocular pressure of both eyes, Botox injection  for cervical dystonia   07/04/2020 - neurology - botox injections.  Hospital visits: None in previous 6 months  Objective:  Lab Results  Component Value Date   CREATININE 0.87 12/28/2020   BUN 7 (L) 12/28/2020   GFRNONAA 67 05/17/2020   GFRAA 78 05/17/2020   NA 143 12/28/2020   K 4.2 12/28/2020   CALCIUM 9.6 12/28/2020   CO2 25 12/28/2020    Lab Results  Component Value Date/Time   HGBA1C 7.4 (H) 12/28/2020 09:59 AM   HGBA1C 7.7 (H) 08/26/2020 08:18 AM   MICROALBUR 80 12/28/2020 09:49 AM   MICROALBUR 80 01/15/2020 08:28 AM    Last diabetic Eye exam: No results found for: HMDIABEYEEXA  Last diabetic Foot exam: No results found for: HMDIABFOOTEX   Lab Results  Component Value Date   CHOL 195 12/28/2020   HDL 53 12/28/2020   LDLCALC 126 (H) 12/28/2020   TRIG 91 12/28/2020   CHOLHDL 3.7 12/28/2020    Hepatic Function Latest Ref Rng & Units 12/28/2020 08/26/2020 05/17/2020  Total Protein 6.0 - 8.5 g/dL 6.7 6.6 6.4  Albumin 3.8 - 4.8 g/dL 4.5  4.4 4.4  AST 0 - 40 IU/L '27 28 25  ' ALT 0 - 32 IU/L '22 26 16  ' Alk Phosphatase 44 - 121 IU/L 98 105 105  Total Bilirubin 0.0 - 1.2 mg/dL 0.6 0.6 0.6    Lab Results  Component Value Date/Time   TSH 2.560 12/28/2020 10:15 AM   TSH 1.760 01/15/2020 09:07 AM    CBC Latest Ref Rng & Units 12/28/2020 08/26/2020 05/17/2020  WBC 3.4 - 10.8 x10E3/uL 7.9 8.3 8.3  Hemoglobin 11.1 - 15.9 g/dL 14.5  14.8 13.3  Hematocrit 34.0 - 46.6 % 42.0 44.4 39.3  Platelets 150 - 450 x10E3/uL 288 311 319    Lab Results  Component Value Date/Time   VD25OH 28.6 (L) 01/15/2020 09:13 AM    Clinical ASCVD: No  The 10-year ASCVD risk score Mikey Bussing DC Jr., et al., 2013) is: 11.8%   Values used to calculate the score:     Age: 65 years     Sex: Female     Is Non-Hispanic African American: No     Diabetic: Yes     Tobacco smoker: No     Systolic Blood Pressure: 382 mmHg     Is BP treated: No     HDL Cholesterol: 53 mg/dL     Total Cholesterol: 195 mg/dL    Depression screen PHQ 2/9 01/15/2020  Decreased Interest 0  Down, Depressed, Hopeless 0  PHQ - 2 Score 0     Social History   Tobacco Use  Smoking Status Never  Smokeless Tobacco Never   BP Readings from Last 3 Encounters:  12/28/20 (!) 152/82  10/24/20 136/80  08/26/20 120/82   Pulse Readings from Last 3 Encounters:  12/28/20 76  10/24/20 88  08/26/20 82   Wt Readings from Last 3 Encounters:  12/28/20 202 lb (91.6 kg)  10/24/20 201 lb (91.2 kg)  08/26/20 201 lb (91.2 kg)    Assessment/Interventions: Review of patient past medical history, allergies, medications, health status, including review of consultants reports, laboratory and other test data, was performed as part of comprehensive evaluation and provision of chronic care management services.   SDOH:  (Social Determinants of Health) assessments and interventions performed: Yes   CCM Care Plan  Allergies  Allergen Reactions   Dextromethorphan-Guaifenesin Itching   Hydrocodone Other (See Comments)    Inflammatory Inflammatory Other reaction(s): Other (See Comments) Inflammatory Inflammatory    Lidocaine Swelling   Other Rash, Other (See Comments) and Swelling    Neurological damage fragances fragances  Neurological damage Fragances; YELLOW NUMBER 5;  Yellow #5 Yellow #5 Yellow #5 Yellow #5  High eye pressure High eye pressure High eye pressure     Sodium Laureth Sulfate Dermatitis, Hives, Itching, Other (See Comments), Rash and Swelling   Sulfa Antibiotics Hives   Sulfites Shortness Of Breath    Other reaction(s): Shortness of Breath Other reaction(s): Shortness of Breath    Articaine-Epinephrine Swelling   Cymbalta [Duloxetine Hcl]    Darvon [Propoxyphene]    Duloxetine    Gentamicin Sulfate    Hctz [Hydrochlorothiazide]    Humalog [Insulin Lispro]    Keflex [Cephalexin]    Lasix [Furosemide]    Lisinopril-Hydrochlorothiazide    Loratadine    Macrodantin [Nitrofurantoin]    Metformin    Mucinex [Guaifenesin Er]    Peanut-Containing Drug Products    Penicillins    Pioglitazone    Tessalon Perles [Benzonatate]    Travatan [Travoprost]    Tylenol [Acetaminophen]    Propofol Other (  See Comments) and Nausea And Vomiting   Sulfamethoxazole-Trimethoprim Rash    Medications Reviewed Today     Reviewed by Oleta Mouse, CMA (Certified Medical Assistant) on 12/28/20 at [place] List Status: <None>   Medication Order Taking? Sig Documenting Provider Last Dose Status Informant  Alpha-Lipoic Acid 300 MG CAPS [id number] Yes Take 300 mg by mouth daily. [provider] Taking Active   baclofen (LIORESAL) 10 MG tablet [id number] Yes Take 10 mg by mouth 3 (three) times daily. [provider] Taking Active   Cholecalciferol (VITAMIN D) 50 MCG (2000 UT) tablet [id number] Yes Take 2,000 Units by mouth daily. [provider] Taking Active   CONTOUR NEXT TEST test strip [id number] Yes USE 1 STRIP TO [place] Lillard Anes, MD Taking Active   Cyanocobalamin 1000 MCG TBCR [id number] Yes Take 1 tablet by mouth daily. [provider] Taking Active   D-Mannose POWD [id number] Yes Take by mouth. [provider] Taking Active   Dulaglutide (TRULICITY) 8.15 TE/7.0RA SOPN [id number] Yes Inject 0.75 mg into the skin once a week. Lillard Anes, MD Taking Active   fluconazole  (DIFLUCAN) 150 MG tablet [id number] Yes TAKE 1 TABLET BY MOUTH EVERY DAY Lillard Anes, MD Taking Active   glucose blood test strip [id number] Yes 1 each by Other route 3 (three) times daily as needed. Use as instructed [provider] Taking Active   hydrOXYzine (ATARAX) 10 MG/5ML syrup [id number] Yes Take by mouth. [provider] Taking Active   hydrOXYzine (VISTARIL) 50 MG capsule [id number] Yes Take by mouth daily as needed. [provider] Taking Active   ipratropium-albuterol (DUONEB) 0.5-2.5 (3) MG/3ML SOLN [id number] Yes Inhale 3 mLs into the lungs every 6 (six) hours as needed.  [provider] Taking Active   Lancets [place] [id number] Yes by Does not apply route. [provider] Taking Active   NOVOLOG FLEXPEN 100 UNIT/ML FlexPen [id number] Yes Inject into the skin daily. Patient uses slidding scale. Maximum 30 units uses only blood sugar >257m/dl.  Using 8-10 units when sugars are high lately. [provider] Taking Active   pyridOXINE (VITAMIN B-6) 50 MG tablet [id number] Take 25 mg by mouth daily. Takes the p5pb6 version 25 mg daily [provider] Taking Active   Sodium Chloride-Sodium Bicarb (NETI POT SINUS [place]) [id number] Place into the nose as needed. [provider] Taking Active   TOUJEO SOLOSTAR 300 UNIT/ML Solostar Pen [id number] Inject 23 Units into the skin daily.  Patient taking differently: Inject 20 Units into the skin in the morning and at bedtime.   PLillard Anes MD Taking Active   TRIAMCINOLONE ACETONIDE, TOP, 0.05 % OINT [id number] Apply topically daily as needed. Uses as needed for rash, bug bites or on gums before dentist. [provider] Taking Active   Zinc 25 MG TABS [id number] Take by mouth. 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  Arthralgia 10/28/2013   Vitamin D deficiency 10/16/2012   Food intolerance 09/04/2012   Allergy history, peanuts 09/04/2012   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    Immunization History  Administered Date(s) Administered   Influenza-Unspecified 04/09/2013    Conditions to be addressed/monitored:  Hypertension, Hyperlipidemia, Diabetes and Asthma  Care Plan : [place]  Updates made by Burnice Logan, [place] since 01/05/2021 12:00 AM     Problem: hld, dm, htn   Priority: High  Onset Date: 08/29/2020     Long-Range Goal: Disease Management   Start Date: 08/29/2020  Expected End Date: 08/29/2021  Recent Progress: On track  Priority: High  Note:   Pharmacist Clinical Goal(s):  Over the next 90 days, patient will verbalize ability to afford treatment regimen achieve control of diabetes as evidenced by a1c and blood glucose through collaboration with PharmD and provider.   Interventions: 1:1 collaboration with Lillard Anes, MD regarding development and update of comprehensive plan of care as evidenced by provider attestation and co-signature Inter-disciplinary care team collaboration (see longitudinal plan of care) Comprehensive medication review performed; medication list updated in electronic medical record  Hypertension (BP goal <130/80) -Controlled -Current treatment: Diet and lifestyle -Medications previously tried: lisinopril, hydrochlorothiazide  -Current home readings: 115/60 -Current dietary habits: reports increased blood sugar readings from deviating from normal  diet -Current exercise habits: pool exercise therapy daily -Denies hypotensive/hypertensive symptoms -Educated on BP goals and benefits of medications for prevention of heart attack, stroke and kidney damage; Daily salt intake goal < 2300 mg; Exercise goal of 150 minutes per week; Importance of home blood pressure monitoring; Symptoms of hypotension and importance of maintaining adequate hydration; -Counseled to monitor BP at home monthly, document, and provide log at future appointments -Counseled on diet and exercise extensively  Hyperlipidemia: (LDL goal < 70) -Not ideally controlled -Current treatment: Diet and lifestyle  -Medications previously tried: none reported -Current dietary patterns: has varied from her normal a little more than normal but getting back on track.  -Current exercise habits: daily pool rehab exercise.  -Educated on Cholesterol goals;  Importance of limiting foods high in cholesterol; Exercise goal of 150 minutes per week; -Counseled on diet and exercise extensively  Diabetes (A1c goal <7%) -Not ideally controlled -Current medications: Trulicity 1.02 mg weekly  Novolog sliding scale before meals if needed - not using regularly Toujeo 20-23 units twice daily  -Medications previously tried: metformin, Victoza -Current home glucose readings fasting glucose: 100-160 mg/dL -Denies hypoglycemic/hyperglycemic symptoms -Current meal patterns:  Diet: states that she knows variations and her diet is why her a1c isn't better than it is  drinks: milk, water, sage tea, ginger tea -Current exercise: daily pool exercise for rehab -Educated onA1c and blood sugar goals; Complications of diabetes including kidney damage, retinal damage, and cardiovascular disease; Exercise goal of 150 minutes per week; Benefits of weight loss; Benefits of routine self-monitoring of blood sugar; Carbohydrate counting and/or plate method -Counseled to check feet daily and get yearly  eye exams -Counseled on diet and exercise extensively Recommended continuing to monitor blood sugar and patient declines option of increasing Trulicity at this time. 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   Health Maintenance -Vaccine gaps: contraindications to flu or COVID -Current therapy:  cyanocobalamin 1000 mcg daily  Alpha-lipoic acid 300 mg daily  D-mannose powder  Vitamin D 2000 units daily  Zinc 50 mg daily Monday-Friday Vitamin b-6 25 mg daily  -Educated on Herbal supplement research is  limited and benefits usually cannot be proven -Patient is satisfied with current therapy and denies issues -Counseled on diet and exercise extensively Recommended to continue current medication   Patient Goals/Self-Care Activities Over the next 90 days, patient will:  - take medications as prescribed focus on medication adherence by using pill box check glucose 3-4 times daily , document, and provide at future appointments target a minimum of 150 minutes of moderate intensity exercise weekly Reduce carbohydrate intake and work to improve diet since COVID and stomach symptoms.   Follow Up Plan: Telephone follow up appointment with care management team member scheduled for: 5 months 05/2021       Medication Assistance:  Trulicity obtained through [place] Patient assistance and Novonordisk medication assistance program.  Enrollment ends 06/24/2021  Patient's preferred pharmacy is:  [id number], [place] - [address] AT [address] [place] [address] Phone: [phone] Fax: [phone]  [place], [place] [address] [place] [address] Phone: [phone] Fax: [phone]  Uses pill box? Yes Pt endorses good compliance  We discussed: Current pharmacy is preferred with insurance plan and patient is satisfied with pharmacy  services Patient decided to: Continue current medication management strategy  Care Plan and Follow Up Patient Decision:  Patient agrees to Care Plan and Follow-up.  Plan: Telephone follow up appointment with care management team member scheduled for:  December 2022 and will renew patient assistance application

## 2021-01-09 DIAGNOSIS — G243 Spasmodic torticollis: Secondary | ICD-10-CM | POA: Diagnosis not present

## 2021-01-30 DIAGNOSIS — Z1211 Encounter for screening for malignant neoplasm of colon: Secondary | ICD-10-CM | POA: Diagnosis not present

## 2021-01-30 LAB — COLOGUARD: Cologuard: NEGATIVE

## 2021-02-04 LAB — COLOGUARD: COLOGUARD: NEGATIVE

## 2021-02-10 ENCOUNTER — Encounter: Payer: Self-pay | Admitting: Legal Medicine

## 2021-02-10 ENCOUNTER — Other Ambulatory Visit: Payer: Self-pay

## 2021-02-10 ENCOUNTER — Ambulatory Visit (INDEPENDENT_AMBULATORY_CARE_PROVIDER_SITE_OTHER): Payer: Medicare Other | Admitting: Legal Medicine

## 2021-02-10 VITALS — BP 124/86 | HR 96 | Temp 97.2°F | Ht 67.0 in | Wt 201.2 lb

## 2021-02-10 DIAGNOSIS — R319 Hematuria, unspecified: Secondary | ICD-10-CM | POA: Diagnosis not present

## 2021-02-10 DIAGNOSIS — N390 Urinary tract infection, site not specified: Secondary | ICD-10-CM | POA: Diagnosis not present

## 2021-02-10 DIAGNOSIS — R3915 Urgency of urination: Secondary | ICD-10-CM | POA: Diagnosis not present

## 2021-02-10 LAB — POCT URINALYSIS DIP (CLINITEK)
Bilirubin, UA: NEGATIVE
Glucose, UA: NEGATIVE mg/dL
Ketones, POC UA: NEGATIVE mg/dL
Nitrite, UA: POSITIVE — AB
POC PROTEIN,UA: 30 — AB
Spec Grav, UA: >=1.03 — AB (ref 1.010–1.025)
Urobilinogen, UA: 0.2 E.U./dL
pH, UA: 5 (ref 5.0–8.0)

## 2021-02-10 MED ORDER — DOXYCYCLINE HYCLATE 100 MG PO TABS: 100.0000 mg | ORAL_TABLET | Freq: Two times a day (BID) | ORAL | 0 refills | Status: DC

## 2021-02-10 NOTE — Progress Notes (Signed)
Established Patient Office Visit  Subjective:  Patient ID: Suzanne Hardin, female    DOB: 1956-01-04  Age: 65 y.o. MRN: [id number]  CC:  Chief Complaint  Patient presents with   Urinary Tract Infection    Patient stated mild symptoms started 2 days ago, last night it got a lot worse, terrible emptying, back pain, urgency.     HPI Suzanne Hardin presents for UTI for 2 days.  No fever or chills.  Past Medical History:  Diagnosis Date   Cervical dystonia 04/03/2017   Cystitis    Essential hypertension 09/16/2019   Hypertension    Moderate persistent asthma 09/16/2019   Sciatica of right side    Type 2 diabetes mellitus with other specified complication ([place]) 8/67/6195    Past Surgical History:  Procedure Laterality Date   APPENDECTOMY  2000   CATARACT EXTRACTION, BILATERAL     CHOLECYSTECTOMY  2000   TONSILLECTOMY AND ADENOIDECTOMY      Family History  Problem Relation Age of Onset   Ulcers Father    Peripheral Artery Disease Father     Social History   Socioeconomic History   Marital status: Divorced    Spouse name: Not on file   Number of children: 2   Years of education: Not on file   Highest education level: Not on file  Occupational History   Occupation: Disabled  Tobacco Use   Smoking status: Never   Smokeless tobacco: Never  Vaping Use   Vaping Use: Never used  Substance and Sexual Activity   Alcohol use: Never   Drug use: Never   Sexual activity: Yes    Partners: Male  Other Topics Concern   Not on file  Social History Narrative   Not on file   Social Determinants of Health   Financial Resource Strain: Not on file  Food Insecurity: No Food Insecurity   Worried About Running Out of Food in the Last Year: Never true   [place] in the Last Year: Never true  Transportation Needs: No Transportation Needs   Lack of Transportation (Medical): No   Lack of Transportation (Non-Medical): No  Physical Activity: Sufficiently Active   Days  of Exercise per Week: 5 days   Minutes of Exercise per Session: 30 min  Stress: Not on file  Social Connections: Not on file  Intimate Partner Violence: Not on file    Outpatient Medications Prior to Visit  Medication Sig Dispense Refill   Alpha-Lipoic Acid 300 MG CAPS Take 300 mg by mouth daily.     baclofen (LIORESAL) 10 MG tablet Take 10 mg by mouth 3 (three) times daily.     Cholecalciferol (VITAMIN D) 50 MCG (2000 UT) tablet Take 2,000 Units by mouth daily.     CONTOUR NEXT TEST test strip USE 1 STRIP TO CHECK GLUCOSE TWICE DAILY 100 each 6   Cyanocobalamin 1000 MCG TBCR Take 1 tablet by mouth daily.     D-Mannose POWD Take by mouth.     Dulaglutide (TRULICITY) 0.93 OI/7.1IW SOPN Inject 0.75 mg into the skin once a week. 2 mL 6   fluconazole (DIFLUCAN) 150 MG tablet TAKE 1 TABLET BY MOUTH EVERY DAY 7 tablet 0   glucose blood test strip 1 each by Other route 3 (three) times daily as needed. Use as instructed     hydrOXYzine (ATARAX) 10 MG/5ML syrup Take by mouth.     hydrOXYzine (VISTARIL) 50 MG capsule Take by mouth daily as needed.  ipratropium-albuterol (DUONEB) 0.5-2.5 (3) MG/3ML SOLN Inhale 3 mLs into the lungs every 6 (six) hours as needed.      Lancets MISC by Does not apply route.     NOVOLOG FLEXPEN 100 UNIT/ML FlexPen Inject into the skin daily. Patient uses slidding scale. Maximum 30 units uses only blood sugar >258m/dl.  Using 8-10 units when sugars are high lately.     pyridOXINE (VITAMIN B-6) 50 MG tablet Take 25 mg by mouth daily. Takes the p5pb6 version 25 mg daily     Sodium Chloride-Sodium Bicarb (NETI POT SINUS [place]) Place into the nose as needed.     TOUJEO SOLOSTAR 300 UNIT/ML Solostar Pen Inject 23 Units into the skin daily. (Patient taking differently: Inject 20 Units into the skin in the morning and at bedtime.) 6 pen 6   TRIAMCINOLONE ACETONIDE, TOP, 0.05 % OINT Apply topically daily as needed. Uses as needed for rash, bug bites or on gums before  dentist.     Zinc 25 MG TABS Take by mouth.     No facility-administered medications prior to visit.    Allergies  Allergen Reactions   Dextromethorphan-Guaifenesin Itching   Hydrocodone Other (See Comments)    Inflammatory Inflammatory Other reaction(s): Other (See Comments) Inflammatory Inflammatory    Lidocaine Swelling   Other Rash, Other (See Comments) and Swelling    Neurological damage fragances fragances  Neurological damage Fragances; YELLOW NUMBER 5;  Yellow #5 Yellow #5 Yellow #5 Yellow #5  High eye pressure High eye pressure High eye pressure    Sodium Laureth Sulfate Dermatitis, Hives, Itching, Other (See Comments), Rash and Swelling   Sulfa Antibiotics Hives   Sulfites Shortness Of Breath    Other reaction(s): Shortness of Breath Other reaction(s): Shortness of Breath    Articaine-Epinephrine Swelling   Cymbalta [Duloxetine Hcl]    Darvon [Propoxyphene]    Duloxetine    Gentamicin Sulfate    Hctz [Hydrochlorothiazide]    Humalog [Insulin Lispro]    Keflex [Cephalexin]    Lasix [Furosemide]    Lisinopril-Hydrochlorothiazide    Loratadine    Macrodantin [Nitrofurantoin]    Metformin    Mucinex [Guaifenesin Er]    Peanut-Containing Drug Products    Penicillins    Pioglitazone    Tessalon Perles [Benzonatate]    Travatan [Travoprost]    Tylenol [Acetaminophen]    Propofol Other (See Comments) and Nausea And Vomiting   Sulfamethoxazole-Trimethoprim Rash    ROS Review of Systems  Constitutional:  Negative for activity change and appetite change.  HENT:  Negative for congestion.   Eyes:  Negative for visual disturbance.  Respiratory:  Negative for chest tightness and shortness of breath.   Cardiovascular:  Negative for chest pain and palpitations.  Gastrointestinal:  Negative for abdominal distention and abdominal pain.  Genitourinary:  Positive for difficulty urinating and dysuria.  Musculoskeletal:  Negative for arthralgias and back  pain.  Neurological: Negative.   Psychiatric/Behavioral: Negative.       Objective:    Physical Exam Vitals reviewed.  Constitutional:      Appearance: Normal appearance.  HENT:     Right Ear: Tympanic membrane normal.     Left Ear: Tympanic membrane normal.  Cardiovascular:     Rate and Rhythm: Normal rate and regular rhythm.     Pulses: Normal pulses.     Heart sounds: Normal heart sounds. No murmur heard.   No gallop.  Pulmonary:     Effort: Pulmonary effort is normal. No respiratory distress.  Breath sounds: Normal breath sounds. No wheezing.  Abdominal:     General: Abdomen is flat. Bowel sounds are normal. There is no distension.     Tenderness: There is no abdominal tenderness.  Musculoskeletal:        General: Normal range of motion.  Skin:    General: Skin is warm.     Capillary Refill: Capillary refill takes less than 2 seconds.  Neurological:     General: No focal deficit present.     Mental Status: She is alert and oriented to person, place, and time.    BP 124/86   Pulse 96   Temp (!) 97.2 F (36.2 C)   Ht '5\' 7"'  (1.702 m)   Wt 201 lb 3.2 oz (91.3 kg)   SpO2 98%   BMI 31.51 kg/m  Wt Readings from Last 3 Encounters:  02/10/21 201 lb 3.2 oz (91.3 kg)  12/28/20 202 lb (91.6 kg)  10/24/20 201 lb (91.2 kg)     Health Maintenance Due  Topic Date Due   OPHTHALMOLOGY EXAM  Never done   HIV Screening  Never done   Hepatitis C Screening  Never done   Zoster Vaccines- Shingrix (1 of 2) Never done    There are no preventive care reminders to display for this patient.  Lab Results  Component Value Date   TSH 2.560 12/28/2020   Lab Results  Component Value Date   WBC 7.9 12/28/2020   HGB 14.5 12/28/2020   HCT 42.0 12/28/2020   MCV 88 12/28/2020   PLT 288 12/28/2020   Lab Results  Component Value Date   NA 143 12/28/2020   K 4.2 12/28/2020   CO2 25 12/28/2020   GLUCOSE 113 (H) 12/28/2020   BUN 7 (L) 12/28/2020   CREATININE 0.87  12/28/2020   BILITOT 0.6 12/28/2020   ALKPHOS 98 12/28/2020   AST 27 12/28/2020   ALT 22 12/28/2020   PROT 6.7 12/28/2020   ALBUMIN 4.5 12/28/2020   CALCIUM 9.6 12/28/2020   EGFR 75 12/28/2020   Lab Results  Component Value Date   CHOL 195 12/28/2020   Lab Results  Component Value Date   HDL 53 12/28/2020   Lab Results  Component Value Date   LDLCALC 126 (H) 12/28/2020   Lab Results  Component Value Date   TRIG 91 12/28/2020   Lab Results  Component Value Date   CHOLHDL 3.7 12/28/2020   Lab Results  Component Value Date   HGBA1C 7.4 (H) 12/28/2020      Assessment & Plan:   Problem List Items Addressed This Visit       Other   Urinary urgency - Primary   Relevant Orders   POCT URINALYSIS DIP (CLINITEK) (Completed)   Urine Culture Patient has UTI, treat with doxycycline   Other Visit Diagnoses     Urinary tract infection with hematuria, site unspecified       Relevant Medications   doxycycline (VIBRA-TABS) 100 MG tablet   Other Relevant Orders   Urine Culture       Meds ordered this encounter  Medications   doxycycline (VIBRA-TABS) 100 MG tablet    Sig: Take 1 tablet (100 mg total) by mouth 2 (two) times daily.    Dispense:  20 tablet    Refill:  0    Follow-up: Return if symptoms worsen or fail to improve.    Reinaldo Meeker, MD

## 2021-02-13 LAB — URINE CULTURE

## 2021-02-13 NOTE — Progress Notes (Signed)
Urine culture Klebsiella Pneumonia lp

## 2021-02-18 ENCOUNTER — Telehealth: Payer: Self-pay

## 2021-02-18 NOTE — Telephone Encounter (Signed)
Outreach call placed to patient in attempt to schedule AWV. Patient is currently scheduled for in person visit and would like to keep her upcoming appointment.   Suzanne Hardin

## 2021-02-23 ENCOUNTER — Telehealth: Payer: Self-pay

## 2021-02-23 NOTE — Chronic Care Management (AMB) (Signed)
    Chronic Care Management Pharmacy Assistant   Name: Suzanne Hardin  MRN: [id number] DOB: [birth year]  Reason for Encounter: Patient Assistance Coordination/Disease State- Diabetes Adherence.  02/23/2021- Called [employer] to check on patient status of application for Novolog flexpen patient assistance. Spoke with Suzanne Hardin, she see's patient application, reviewed all information, there is a date missing in the patient [place] portion that will need to be updated and refaxed. Lucia Gaskins, Pharm D notified.  03/01/2021-Called [employer] again to check on the status of application, per automated response application unable to be processed , missing information. Transferred to an representative, spoke with Suzanne Hardin, she informed me that they did receive the updated date on [place] portion but income will need to be updated, they have a 2020 1099 form that can not be used, they will need either her 2021 1099 form or 2022 SS income documentation. Lucia Gaskins, Pharm D notified. I will call patient to request updated income documentation.  03/03/2021- Spoke with patient, she is aware an updated income documentation is needed, either 2021 1099 or social security 2022 letter. Patient states she will look for this information, she is currently heading out of town and would like a reminder emailed to her. She will get it to the office or scan and email. Sending contact information for patient, will follow up next week and complete Diabetes adherence with call.  03/13/2021- Called patient to follow up on income documentation documents needed for application. Patient forgot to take to PCP office but will try to get that by in the next 2 days. Will address Diabetes next month with patient.  03/16/2021- Patient brought income documentation and it was faxed to [employer] by Caro Hight, LPN.    Billee Cashing, CMA Clinical Pharmacist Assistant [phone]

## 2021-02-28 ENCOUNTER — Other Ambulatory Visit: Payer: Self-pay

## 2021-02-28 MED ORDER — NOVOLOG FLEXPEN 100 UNIT/ML ~~LOC~~ SOPN: PEN_INJECTOR | SUBCUTANEOUS | 1 refills | Status: AC

## 2021-02-28 MED ORDER — NOVOLOG FLEXPEN 100 UNIT/ML ~~LOC~~ SOPN: PEN_INJECTOR | SUBCUTANEOUS | 1 refills | Status: DC

## 2021-03-16 NOTE — Telephone Encounter (Signed)
Patient brought income documents in today and patient assistance application was faxed to [employer].

## 2021-03-23 ENCOUNTER — Telehealth: Payer: Self-pay

## 2021-03-23 NOTE — Telephone Encounter (Signed)
Called patient to reschedule appointment. Has been reschedule to 10/28

## 2021-03-24 ENCOUNTER — Other Ambulatory Visit: Payer: Self-pay

## 2021-03-24 ENCOUNTER — Ambulatory Visit (INDEPENDENT_AMBULATORY_CARE_PROVIDER_SITE_OTHER): Payer: Medicare Other | Admitting: Family Medicine

## 2021-03-24 VITALS — BP 136/78 | HR 99 | Temp 95.1°F | Ht 66.5 in | Wt 200.0 lb

## 2021-03-24 DIAGNOSIS — N3001 Acute cystitis with hematuria: Secondary | ICD-10-CM | POA: Diagnosis not present

## 2021-03-24 LAB — POCT URINALYSIS DIPSTICK
Bilirubin, UA: NEGATIVE
Glucose, UA: NEGATIVE
Ketones, UA: NEGATIVE
Nitrite, UA: POSITIVE
Protein, UA: POSITIVE — AB
Spec Grav, UA: 1.015 (ref 1.010–1.025)
Urobilinogen, UA: 0.2 E.U./dL
pH, UA: 6 (ref 5.0–8.0)

## 2021-03-24 MED ORDER — FLUCONAZOLE 150 MG PO TABS: 150.0000 mg | ORAL_TABLET | Freq: Every day | ORAL | 0 refills | Status: DC

## 2021-03-24 MED ORDER — CIPROFLOXACIN HCL 250 MG PO TABS: 250.0000 mg | ORAL_TABLET | Freq: Two times a day (BID) | ORAL | 0 refills | Status: AC

## 2021-03-24 NOTE — Progress Notes (Signed)
Acute Office Visit  Subjective:    Patient ID: Suzanne Hardin, female    DOB: 01/08/1956, 65 y.o.   MRN: [id number]  Chief Complaint  Patient presents with   Urinary Tract Infection    HPI Patient is in today for UTI sxs (frequency and urgency, but no dysuria, fevers, or chills.) Symptoms started Wednesday, states she typically will become fatigued and notice her FBS being more elevated than normal.  Past Medical History:  Diagnosis Date   Cervical dystonia 04/03/2017   Cystitis    Essential hypertension 09/16/2019   Hypertension    Moderate persistent asthma 09/16/2019   Sciatica of right side    Type 2 diabetes mellitus with other specified complication ([place]) 7/34/1937    Past Surgical History:  Procedure Laterality Date   APPENDECTOMY  2000   CATARACT EXTRACTION, BILATERAL     CHOLECYSTECTOMY  2000   TONSILLECTOMY AND ADENOIDECTOMY      Family History  Problem Relation Age of Onset   Ulcers Father    Peripheral Artery Disease Father     Social History   Socioeconomic History   Marital status: Divorced    Spouse name: Not on file   Number of children: 2   Years of education: Not on file   Highest education level: Not on file  Occupational History   Occupation: Disabled  Tobacco Use   Smoking status: Never   Smokeless tobacco: Never  Vaping Use   Vaping Use: Never used  Substance and Sexual Activity   Alcohol use: Never   Drug use: Never   Sexual activity: Yes    Partners: Male  Other Topics Concern   Not on file  Social History Narrative   Not on file   Social Determinants of Health   Financial Resource Strain: Not on file  Food Insecurity: No Food Insecurity   Worried About Running Out of Food in the Last Year: Never true   [place] in the Last Year: Never true  Transportation Needs: No Transportation Needs   Lack of Transportation (Medical): No   Lack of Transportation (Non-Medical): No  Physical Activity: Sufficiently Active    Days of Exercise per Week: 5 days   Minutes of Exercise per Session: 30 min  Stress: Not on file  Social Connections: Not on file  Intimate Partner Violence: Not on file    Outpatient Medications Prior to Visit  Medication Sig Dispense Refill   Alpha-Lipoic Acid 300 MG CAPS Take 300 mg by mouth daily.     baclofen (LIORESAL) 10 MG tablet Take 10 mg by mouth 3 (three) times daily.     Cholecalciferol (VITAMIN D) 50 MCG (2000 UT) tablet Take 2,000 Units by mouth daily.     CONTOUR NEXT TEST test strip USE 1 STRIP TO CHECK GLUCOSE TWICE DAILY 100 each 6   Cyanocobalamin 1000 MCG TBCR Take 1 tablet by mouth daily.     D-Mannose POWD Take by mouth.     doxycycline (VIBRA-TABS) 100 MG tablet Take 1 tablet (100 mg total) by mouth 2 (two) times daily. 20 tablet 0   Dulaglutide (TRULICITY) 9.02 IO/9.7DZ SOPN Inject 0.75 mg into the skin once a week. 2 mL 6   glucose blood test strip 1 each by Other route 3 (three) times daily as needed. Use as instructed     hydrOXYzine (ATARAX) 10 MG/5ML syrup Take by mouth.     hydrOXYzine (VISTARIL) 50 MG capsule Take by mouth daily as needed.  ipratropium-albuterol (DUONEB) 0.5-2.5 (3) MG/3ML SOLN Inhale 3 mLs into the lungs every 6 (six) hours as needed.      Lancets MISC by Does not apply route.     NOVOLOG FLEXPEN 100 UNIT/ML FlexPen Patient uses slidding scale. Maximum 30 units uses only blood sugar >242m/dl. Using 8-10 units when sugars are high lately. 15 mL 1   pyridOXINE (VITAMIN B-6) 50 MG tablet Take 25 mg by mouth daily. Takes the p5pb6 version 25 mg daily     Sodium Chloride-Sodium Bicarb (NETI POT SINUS [place]) Place into the nose as needed.     TOUJEO SOLOSTAR 300 UNIT/ML Solostar Pen Inject 23 Units into the skin daily. (Patient taking differently: Inject 20 Units into the skin in the morning and at bedtime.) 6 pen 6   TRIAMCINOLONE ACETONIDE, TOP, 0.05 % OINT Apply topically daily as needed. Uses as needed for rash, bug bites or on gums  before dentist.     Zinc 25 MG TABS Take by mouth.     fluconazole (DIFLUCAN) 150 MG tablet TAKE 1 TABLET BY MOUTH EVERY DAY 7 tablet 0   No facility-administered medications prior to visit.    Allergies  Allergen Reactions   Dextromethorphan-Guaifenesin Itching   Hydrocodone Other (See Comments)    Inflammatory Inflammatory Other reaction(s): Other (See Comments) Inflammatory Inflammatory    Lidocaine Swelling   Other Rash, Other (See Comments) and Swelling    Neurological damage fragances fragances  Neurological damage Fragances; YELLOW NUMBER 5;  Yellow #5 Yellow #5 Yellow #5 Yellow #5  High eye pressure High eye pressure High eye pressure    Sodium Laureth Sulfate Dermatitis, Hives, Itching, Other (See Comments), Rash and Swelling   Sulfa Antibiotics Hives   Sulfites Shortness Of Breath    Other reaction(s): Shortness of Breath Other reaction(s): Shortness of Breath    Articaine-Epinephrine Swelling   Cymbalta [Duloxetine Hcl]    Darvon [Propoxyphene]    Duloxetine    Gentamicin Sulfate    Hctz [Hydrochlorothiazide]    Humalog [Insulin Lispro]    Keflex [Cephalexin]    Lasix [Furosemide]    Lisinopril-Hydrochlorothiazide    Loratadine    Macrodantin [Nitrofurantoin]    Metformin    Mucinex [Guaifenesin Er]    Peanut-Containing Drug Products    Penicillins    Pioglitazone    Tessalon Perles [Benzonatate]    Travatan [Travoprost]    Tylenol [Acetaminophen]    Propofol Other (See Comments) and Nausea And Vomiting   Sulfamethoxazole-Trimethoprim Rash    Review of Systems  Constitutional:  Positive for fatigue. Negative for chills and fever.  HENT:  Negative for congestion and sore throat.   Respiratory:  Negative for cough and shortness of breath.   Cardiovascular:  Negative for chest pain.  Gastrointestinal:  Negative for abdominal pain and nausea.  Genitourinary:  Positive for frequency and urgency. Negative for dysuria and flank pain.   Musculoskeletal:  Negative for back pain.      Objective:    Physical Exam Vitals reviewed.  Constitutional:      Appearance: Normal appearance.  Cardiovascular:     Rate and Rhythm: Normal rate and regular rhythm.     Heart sounds: Normal heart sounds.  Pulmonary:     Effort: Pulmonary effort is normal.     Breath sounds: Normal breath sounds.  Abdominal:     General: Bowel sounds are normal.     Palpations: Abdomen is soft.     Tenderness: There is no abdominal tenderness.  Neurological:     Mental Status: She is alert.  Psychiatric:        Mood and Affect: Mood normal.        Behavior: Behavior normal.    BP 136/78   Pulse 99   Temp (!) 95.1 F (35.1 C)   Ht 5' 6.5" (1.689 m)   Wt 200 lb (90.7 kg)   SpO2 100%   BMI 31.80 kg/m  Wt Readings from Last 3 Encounters:  03/24/21 200 lb (90.7 kg)  02/10/21 201 lb 3.2 oz (91.3 kg)  12/28/20 202 lb (91.6 kg)    Health Maintenance Due  Topic Date Due   OPHTHALMOLOGY EXAM  Never done   HIV Screening  Never done   Hepatitis C Screening  Never done   Zoster Vaccines- Shingrix (1 of 2) Never done    There are no preventive care reminders to display for this patient.   Lab Results  Component Value Date   TSH 2.560 12/28/2020   Lab Results  Component Value Date   WBC 7.9 12/28/2020   HGB 14.5 12/28/2020   HCT 42.0 12/28/2020   MCV 88 12/28/2020   PLT 288 12/28/2020   Lab Results  Component Value Date   NA 143 12/28/2020   K 4.2 12/28/2020   CO2 25 12/28/2020   GLUCOSE 113 (H) 12/28/2020   BUN 7 (L) 12/28/2020   CREATININE 0.87 12/28/2020   BILITOT 0.6 12/28/2020   ALKPHOS 98 12/28/2020   AST 27 12/28/2020   ALT 22 12/28/2020   PROT 6.7 12/28/2020   ALBUMIN 4.5 12/28/2020   CALCIUM 9.6 12/28/2020   EGFR 75 12/28/2020   Lab Results  Component Value Date   CHOL 195 12/28/2020   Lab Results  Component Value Date   HDL 53 12/28/2020   Lab Results  Component Value Date   LDLCALC 126 (H)  12/28/2020   Lab Results  Component Value Date   TRIG 91 12/28/2020   Lab Results  Component Value Date   CHOLHDL 3.7 12/28/2020   Lab Results  Component Value Date   HGBA1C 7.4 (H) 12/28/2020       Assessment & Plan:    Problem List Items Addressed This Visit       Genitourinary   Acute cystitis with hematuria - Primary    Rx Cipro.  Fluconazole given for potential yeast infection.       Relevant Orders   Urine Culture (Completed)   POCT urinalysis dipstick (Completed)     Meds ordered this encounter  Medications   ciprofloxacin (CIPRO) 250 MG tablet    Sig: Take 1 tablet (250 mg total) by mouth 2 (two) times daily for 5 days.    Dispense:  10 tablet    Refill:  0   fluconazole (DIFLUCAN) 150 MG tablet    Sig: Take 1 tablet (150 mg total) by mouth daily.    Dispense:  7 tablet    Refill:  0     Orders Placed This Encounter  Procedures   Urine Culture   POCT urinalysis dipstick    Follow-up: No follow-ups on file.  An After Visit Summary was printed and given to the patient.  Rochel Brome, MD [facility] [phone]

## 2021-03-29 ENCOUNTER — Other Ambulatory Visit: Payer: Self-pay | Admitting: Family Medicine

## 2021-03-29 ENCOUNTER — Telehealth: Payer: Self-pay

## 2021-03-29 ENCOUNTER — Other Ambulatory Visit: Payer: Self-pay

## 2021-03-29 DIAGNOSIS — N3001 Acute cystitis with hematuria: Secondary | ICD-10-CM

## 2021-03-29 MED ORDER — FLUCONAZOLE 150 MG PO TABS: 150.0000 mg | ORAL_TABLET | Freq: Every day | ORAL | 0 refills | Status: DC

## 2021-03-29 NOTE — Telephone Encounter (Signed)
Suzanne Hardin called to report that she is not feeling better with her UTI.  Her urine culture is pending but she is growing gram negative rods.  She also was treated for a yeast infection.  What do you recommend

## 2021-03-29 NOTE — Telephone Encounter (Signed)
Called pt. Pt states she is mostly having yeast and cloudy urine that she believes is the yeast.   Dr Sedalia Muta advised two more days of diflucan. If symptoms persist pt will return for nurse visit to give UA.   Lorita Officer, CCMA 03/29/21 5:10 PM

## 2021-03-29 NOTE — Telephone Encounter (Signed)
Pt would like to wait for urine culture before change in treatment. . Complete her cipro.  Does not want levaquin due to previous side effects.  Took monistat x 3 days. Not sure if she still has yeast infextion.

## 2021-03-31 ENCOUNTER — Ambulatory Visit: Payer: Medicare Other | Admitting: Legal Medicine

## 2021-04-01 LAB — URINE CULTURE

## 2021-04-03 ENCOUNTER — Telehealth: Payer: Self-pay

## 2021-04-03 NOTE — Chronic Care Management (AMB) (Signed)
    Chronic Care Management Pharmacy Assistant   Name: OLIVER NEUWIRTH  MRN: [id number] DOB: [birth year]  Reason for Encounter: Patient Assistance Coordination  04/03/2021- Checking on Trulicity patient assistance medication with [employer] Patient Assistance program. [facility], transferred to [facility] but on hold for 10 mins, will retry.  04/07/2021- Called [facility] again, transferred to [facility], in que for 17 mins, after holding for 26 mins, representative Latisha answered and informed me that Trulicity is out of refills, sent message to Dr. Lamar Sprinkles CMA requesting a prescription to be faxed to [facility], Fax# [phone]. Called patient to inform, patient is aware and will check with the office to see when medication arrives, she gets her Trulicity mailed to [employer] [facility]. Artelia Laroche, Pharm D. Notified.    Medications: Outpatient Encounter Medications as of 04/03/2021  Medication Sig   Alpha-Lipoic Acid 300 MG CAPS Take 300 mg by mouth daily.   baclofen (LIORESAL) 10 MG tablet Take 10 mg by mouth 3 (three) times daily.   Cholecalciferol (VITAMIN D) 50 MCG (2000 UT) tablet Take 2,000 Units by mouth daily.   CONTOUR NEXT TEST test strip USE 1 STRIP TO CHECK GLUCOSE TWICE DAILY   Cyanocobalamin 1000 MCG TBCR Take 1 tablet by mouth daily.   D-Mannose POWD Take by mouth.   doxycycline (VIBRA-TABS) 100 MG tablet Take 1 tablet (100 mg total) by mouth 2 (two) times daily.   Dulaglutide (TRULICITY) 0.75 MG/0.5ML SOPN Inject 0.75 mg into the skin once a week.   fluconazole (DIFLUCAN) 150 MG tablet Take 1 tablet (150 mg total) by mouth daily.   glucose blood test strip 1 each by Other route 3 (three) times daily as needed. Use as instructed   hydrOXYzine (ATARAX) 10 MG/5ML syrup Take by mouth.   hydrOXYzine (VISTARIL) 50 MG capsule Take by mouth daily as needed.   ipratropium-albuterol (DUONEB) 0.5-2.5 (3) MG/3ML SOLN Inhale 3 mLs into the lungs every 6 (six)  hours as needed.    Lancets MISC by Does not apply route.   NOVOLOG FLEXPEN 100 UNIT/ML FlexPen Patient uses slidding scale. Maximum 30 units uses only blood sugar >250mg /dl. Using 8-10 units when sugars are high lately.   pyridOXINE (VITAMIN B-6) 50 MG tablet Take 25 mg by mouth daily. Takes the p5pb6 version 25 mg daily   Sodium Chloride-Sodium Bicarb (NETI POT SINUS [place] NA) Place into the nose as needed.   TOUJEO SOLOSTAR 300 UNIT/ML Solostar Pen Inject 23 Units into the skin daily. (Patient taking differently: Inject 20 Units into the skin in the morning and at bedtime.)   TRIAMCINOLONE ACETONIDE, TOP, 0.05 % OINT Apply topically daily as needed. Uses as needed for rash, bug bites or on gums before dentist.   Zinc 25 MG TABS Take by mouth.   No facility-administered encounter medications on file as of 04/03/2021.    Billee Cashing, CMA Clinical Pharmacist Assistant [phone]

## 2021-04-04 ENCOUNTER — Encounter: Payer: Self-pay | Admitting: Family Medicine

## 2021-04-04 NOTE — Assessment & Plan Note (Signed)
Rx Cipro.  Fluconazole given for potential yeast infection.

## 2021-04-07 ENCOUNTER — Other Ambulatory Visit: Payer: Self-pay

## 2021-04-07 DIAGNOSIS — Z794 Long term (current) use of insulin: Secondary | ICD-10-CM

## 2021-04-07 DIAGNOSIS — E1169 Type 2 diabetes mellitus with other specified complication: Secondary | ICD-10-CM

## 2021-04-10 ENCOUNTER — Other Ambulatory Visit: Payer: Self-pay

## 2021-04-10 DIAGNOSIS — Z794 Long term (current) use of insulin: Secondary | ICD-10-CM

## 2021-04-10 DIAGNOSIS — E1169 Type 2 diabetes mellitus with other specified complication: Secondary | ICD-10-CM

## 2021-04-10 MED ORDER — TRULICITY 0.75 MG/0.5ML ~~LOC~~ SOAJ
0.7500 mg | SUBCUTANEOUS | 6 refills | Status: DC
Start: 2021-04-10 — End: 2021-07-12

## 2021-04-20 ENCOUNTER — Telehealth: Payer: Self-pay

## 2021-04-20 NOTE — Telephone Encounter (Signed)
I left message on voicemail to let her know that we got patient assistance for her.

## 2021-04-21 ENCOUNTER — Ambulatory Visit (INDEPENDENT_AMBULATORY_CARE_PROVIDER_SITE_OTHER): Payer: Medicare Other | Admitting: Legal Medicine

## 2021-04-21 ENCOUNTER — Other Ambulatory Visit: Payer: Self-pay

## 2021-04-21 ENCOUNTER — Encounter: Payer: Self-pay | Admitting: Legal Medicine

## 2021-04-21 VITALS — BP 135/76 | HR 80 | Temp 97.7°F | Resp 16 | Ht 66.5 in | Wt 201.0 lb

## 2021-04-21 DIAGNOSIS — E782 Mixed hyperlipidemia: Secondary | ICD-10-CM

## 2021-04-21 DIAGNOSIS — Z794 Long term (current) use of insulin: Secondary | ICD-10-CM

## 2021-04-21 DIAGNOSIS — I10 Essential (primary) hypertension: Secondary | ICD-10-CM | POA: Diagnosis not present

## 2021-04-21 DIAGNOSIS — E1169 Type 2 diabetes mellitus with other specified complication: Secondary | ICD-10-CM

## 2021-04-21 DIAGNOSIS — E559 Vitamin D deficiency, unspecified: Secondary | ICD-10-CM | POA: Diagnosis not present

## 2021-04-21 NOTE — Progress Notes (Signed)
Subjective:  Patient ID: Suzanne Hardin, female    DOB: 01/19/1956  Age: 65 y.o. MRN: [id number]  Chief Complaint  Patient presents with   Diabetes   Hyperlipidemia   Hypertension    HPI: chronic visit  Patient present with type 2 diabetes.  Specifically, this is type 2, insulin requiring diabetes, complicated by hypertension, hypercholesterolemia.  Compliance with treatment has been good; patient take medicines as directed, maintains diet and exercise regimen, follows up as directed, and is keeping glucose diary.  Date of  diagnosis 2010.  Depression screen has been performed.Tobacco screen nonsmoker. Current medicines for diabetes tuojeo 20mg .  Patient is on none for renal protection and none for cholesterol control.  Patient performs foot exams daily and last ophthalmologic exam was performed.   Patient presents for follow up of hypertension.  Patient tolerating none well with side effects.  Patient was diagnosed with hypertension 2010 so has been treated for hypertension for 10 years.Patient is working on maintaining diet and exercise regimen and follows up as directed. Complication include non.   Patient presents with hyperlipidemia.  Compliance with treatment has been good; patient takes medicines as directed, maintains low cholesterol diet, follows up as directed, and maintains exercise regimen.  Patient is using none without problems.      Current Outpatient Medications on File Prior to Visit  Medication Sig Dispense Refill   Alpha-Lipoic Acid 300 MG CAPS Take 300 mg by mouth daily.     baclofen (LIORESAL) 10 MG tablet Take 10 mg by mouth 3 (three) times daily.     Cholecalciferol (VITAMIN D) 50 MCG (2000 UT) tablet Take 2,000 Units by mouth daily.     CONTOUR NEXT TEST test strip USE 1 STRIP TO CHECK GLUCOSE TWICE DAILY 100 each 6   Cyanocobalamin 1000 MCG TBCR Take 1 tablet by mouth daily.     D-Mannose POWD Take by mouth.     Dulaglutide (TRULICITY) 0.75 MG/0.5ML SOPN  Inject 0.75 mg into the skin once a week. 2 mL 6   glucose blood test strip 1 each by Other route 3 (three) times daily as needed. Use as instructed     hydrOXYzine (ATARAX) 10 MG/5ML syrup Take by mouth.     ipratropium-albuterol (DUONEB) 0.5-2.5 (3) MG/3ML SOLN Inhale 3 mLs into the lungs every 6 (six) hours as needed.      Lancets MISC by Does not apply route.     NOVOLOG FLEXPEN 100 UNIT/ML FlexPen Patient uses slidding scale. Maximum 30 units uses only blood sugar >250mg /dl. Using 8-10 units when sugars are high lately. 15 mL 1   pyridOXINE (VITAMIN B-6) 50 MG tablet Take 25 mg by mouth daily. Takes the p5pb6 version 25 mg daily     Sodium Chloride-Sodium Bicarb (NETI POT SINUS WASH NA) Place into the nose as needed.     TOUJEO SOLOSTAR 300 UNIT/ML Solostar Pen Inject 23 Units into the skin daily. (Patient taking differently: Inject 20 Units into the skin in the morning and at bedtime.) 6 pen 6   TRIAMCINOLONE ACETONIDE, TOP, 0.05 % OINT Apply topically daily as needed. Uses as needed for rash, bug bites or on gums before dentist.     Zinc 25 MG TABS Take by mouth.     No current facility-administered medications on file prior to visit.   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 Food, (peanuts, bell peppers, raw onions,pickles) Drug, and Skin, environmental, fragrances & air freshners   Type 2 diabetes mellitus with other specified complication (HCC) 09/16/2019   Past Surgical History:  Procedure Laterality Date   APPENDECTOMY  2000   CATARACT EXTRACTION, BILATERAL     CHOLECYSTECTOMY  2000    TONSILLECTOMY AND ADENOIDECTOMY      Family History  Problem Relation Age of Onset   Ulcers Father    Peripheral Artery Disease Father    Social History   Socioeconomic History   Marital status: Divorced    Spouse name: Not on file   Number of children: 2   Years of education: Not on file   Highest education level: Not on file  Occupational History   Occupation: Disabled  Tobacco Use   Smoking status: Never   Smokeless tobacco: Never  Vaping Use   Vaping Use: Never used  Substance and Sexual Activity   Alcohol use: Never   Drug use: Never   Sexual activity: Yes    Partners: Male  Other Topics Concern   Not on file  Social History Narrative   Not on file   Social Determinants of Health   Financial Resource Strain: Not on file  Food Insecurity: No Food Insecurity   Worried About Running Out of Food in the Last Year: Never true   Ran Out of Food in the Last Year: Never true  Transportation Needs: No Transportation Needs   Lack of Transportation (Medical): No   Lack of Transportation (Non-Medical): No  Physical Activity: Sufficiently Active   Days of Exercise per Week: 5 days   Minutes of Exercise per Session: 30 min  Stress: Not on file  Social Connections: Not on file    Review of Systems  Constitutional:  Negative for chills, fatigue and fever.  HENT:  Negative for congestion, ear pain and sore throat.   Respiratory:  Negative for cough and shortness of breath.   Cardiovascular:  Negative for chest pain and palpitations.  Gastrointestinal:  Negative for abdominal pain, constipation, diarrhea, nausea and vomiting.  Endocrine: Negative for polydipsia, polyphagia and polyuria.  Genitourinary:  Negative for difficulty urinating, dysuria and frequency.  Musculoskeletal:  Negative for arthralgias, back pain and myalgias.  Skin:  Negative for rash.  Neurological:  Negative for headaches.  Psychiatric/Behavioral:  Negative for dysphoric mood. The patient is not  nervous/anxious.     Objective:  BP 135/76   Pulse 80   Temp 97.7 F (36.5 C)   Resp 16   Ht 5' 6.5" (1.689 m)   Wt 201 lb (91.2 kg)   SpO2 (!) 78%   BMI 31.96 kg/m   BP/Weight 04/21/2021 03/24/2021 02/10/2021  Systolic BP 135 136 124  Diastolic BP 76 78 86  Wt. (Lbs) 201 200 201.2  BMI 31.96 31.8 31.51    Physical Exam Vitals reviewed.  Constitutional:      General: She is not in acute distress.    Appearance: Normal appearance.  HENT:     Head: Normocephalic.     Right Ear: Tympanic membrane, ear canal and external ear normal.     Left Ear: Tympanic membrane, ear canal and external ear normal.  Mouth/Throat:     Mouth: Mucous membranes are moist.     Pharynx: Oropharynx is clear.  Eyes:     Conjunctiva/sclera: Conjunctivae normal.     Pupils: Pupils are equal, round, and reactive to light.  Cardiovascular:     Rate and Rhythm: Normal rate and regular rhythm.     Pulses: Normal pulses.     Heart sounds: No murmur heard.   No gallop.  Pulmonary:     Effort: Pulmonary effort is normal. No respiratory distress.     Breath sounds: Normal breath sounds. No wheezing.  Abdominal:     General: Abdomen is flat. Bowel sounds are normal. There is no distension.     Palpations: Abdomen is soft.     Tenderness: There is no abdominal tenderness.  Musculoskeletal:        General: Normal range of motion.     Cervical back: Normal range of motion.     Right lower leg: No edema.     Left lower leg: No edema.  Skin:    General: Skin is warm.     Capillary Refill: Capillary refill takes less than 2 seconds.  Neurological:     General: No focal deficit present.     Mental Status: She is alert and oriented to person, place, and time. Mental status is at baseline.  Psychiatric:        Mood and Affect: Mood normal.        Thought Content: Thought content normal.    Diabetic Foot Exam - Simple   Simple Foot Form Diabetic Foot exam was performed with the following findings:  Yes 04/21/2021  8:59 AM  Visual Inspection No deformities, no ulcerations, no other skin breakdown bilaterally: Yes Sensation Testing Intact to touch and monofilament testing bilaterally: Yes Pulse Check Posterior Tibialis and Dorsalis pulse intact bilaterally: Yes Comments      Lab Results  Component Value Date   WBC 7.7 04/21/2021   HGB 14.6 04/21/2021   HCT 42.4 04/21/2021   PLT 311 04/21/2021   GLUCOSE 134 (H) 04/21/2021   CHOL 199 04/21/2021   TRIG 109 04/21/2021   HDL 58 04/21/2021   LDLCALC 122 (H) 04/21/2021   ALT 18 04/21/2021   AST 22 04/21/2021   NA 144 04/21/2021   K 4.1 04/21/2021   CL 105 04/21/2021   CREATININE 0.96 04/21/2021   BUN 8 04/21/2021   CO2 27 04/21/2021   TSH 2.560 12/28/2020   HGBA1C 7.3 (H) 04/21/2021   MICROALBUR 80 12/28/2020      Assessment & Plan:   Problem List Items Addressed This Visit       Cardiovascular and Mediastinum   Essential hypertension   Relevant Orders   Comprehensive metabolic panel (Completed)   CBC with Differential/Platelet (Completed) An individual hypertension care plan was established and reinforced today.  The patient's status was assessed using clinical findings on exam and labs or diagnostic tests. The patient's success at meeting treatment goals on disease specific evidence-based guidelines and found to be well controlled. SELF MANAGEMENT: The patient and I together assessed ways to personally work towards obtaining the recommended goals. RECOMMENDATIONS: avoid decongestants found in common cold remedies, decrease consumption of alcohol, perform routine monitoring of BP with home BP cuff, exercise, reduction of dietary salt, take medicines as prescribed, try not to miss doses and quit smoking.  Regular exercise and maintaining a healthy weight is needed.  Stress reduction may help. A CLINICAL SUMMARY including written plan identify barriers  to care unique to individual due to social or financial issues.  We  attempt to mutually creat solutions for individual and family understanding.      Endocrine   Type 2 diabetes mellitus with other specified complication (HCC) - Primary   Relevant Orders   Hemoglobin A1c (Completed) An individual care plan for diabetes was established and reinforced today.  The patient's status was assessed using clinical findings on exam, labs and diagnostic testing. Patient success at meeting goals based on disease specific evidence-based guidelines and found to be good controlled. Medications were assessed and patient's understanding of the medical issues , including barriers were assessed. Recommend adherence to a diabetic diet, a graduated exercise program, HgbA1c level is checked quarterly, and urine microalbumin performed yearly .  Annual mono-filament sensation testing performed. Lower blood pressure and control hyperlipidemia is important. Get annual eye exams and annual flu shots and smoking cessation discussed.  Self management goals were discussed.      Other   Vitamin D deficiency   Relevant Orders   VITAMIN D 25 Hydroxy (Vit-D Deficiency, Fractures) (Completed) Vitamin D level checked   Mixed hyperlipidemia   Relevant Orders   Lipid panel (Completed) AN INDIVIDUAL CARE PLAN for hyperlipidemia/ cholesterol was established and reinforced today.  The patient's status was assessed using clinical findings on exam, lab and other diagnostic tests. The patient's disease status was assessed based on evidence-based guidelines and found to be well controlled. MEDICATIONS were reviewed. SELF MANAGEMENT GOALS have been discussed and patient's success at attaining the goal of low cholesterol was assessed. RECOMMENDATION given include regular exercise 3 days a week and low cholesterol/low fat diet. CLINICAL SUMMARY including written plan to identify barriers unique to the patient due to social or economic  reasons was discussed.   .  30 minute visit and review of old  records   Orders Placed This Encounter  Procedures   Comprehensive metabolic panel   Hemoglobin A1c   Lipid panel   CBC with Differential/Platelet   VITAMIN D 25 Hydroxy (Vit-D Deficiency, Fractures)   Cardiovascular Risk Assessment      Follow-up: Return in about 4 months (around 08/22/2021) for fasting.  An After Visit Summary was printed and given to the patient.  Brent Bulla, MD [facility] [phone]

## 2021-04-22 LAB — CARDIOVASCULAR RISK ASSESSMENT

## 2021-04-22 LAB — CBC WITH DIFFERENTIAL/PLATELET
Basophils Absolute: 0.1 10*3/uL (ref 0.0–0.2)
Basos: 1 %
EOS (ABSOLUTE): 0.4 10*3/uL (ref 0.0–0.4)
Eos: 5 %
Hematocrit: 42.4 % (ref 34.0–46.6)
Hemoglobin: 14.6 g/dL (ref 11.1–15.9)
Immature Grans (Abs): 0 10*3/uL (ref 0.0–0.1)
Immature Granulocytes: 0 %
Lymphocytes Absolute: 2.8 10*3/uL (ref 0.7–3.1)
Lymphs: 37 %
MCH: 30.3 pg (ref 26.6–33.0)
MCHC: 34.4 g/dL (ref 31.5–35.7)
MCV: 88 fL (ref 79–97)
Monocytes Absolute: 0.3 10*3/uL (ref 0.1–0.9)
Monocytes: 4 %
Neutrophils Absolute: 4.1 10*3/uL (ref 1.4–7.0)
Neutrophils: 53 %
Platelets: 311 10*3/uL (ref 150–450)
RBC: 4.82 x10E6/uL (ref 3.77–5.28)
RDW: 12.9 % (ref 11.7–15.4)
WBC: 7.7 10*3/uL (ref 3.4–10.8)

## 2021-04-22 LAB — LIPID PANEL
Chol/HDL Ratio: 3.4 ratio (ref 0.0–4.4)
Cholesterol, Total: 199 mg/dL (ref 100–199)
HDL: 58 mg/dL (ref >39)
LDL Chol Calc (NIH): 122 mg/dL — ABNORMAL HIGH (ref 0–99)
Triglycerides: 109 mg/dL (ref 0–149)
VLDL Cholesterol Cal: 19 mg/dL (ref 5–40)

## 2021-04-22 LAB — COMPREHENSIVE METABOLIC PANEL
ALT: 18 IU/L (ref 0–32)
AST: 22 IU/L (ref 0–40)
Albumin/Globulin Ratio: 2 (ref 1.2–2.2)
Albumin: 4.3 g/dL (ref 3.8–4.8)
Alkaline Phosphatase: 97 IU/L (ref 44–121)
BUN/Creatinine Ratio: 8 — ABNORMAL LOW (ref 12–28)
BUN: 8 mg/dL (ref 8–27)
Bilirubin Total: 0.5 mg/dL (ref 0.0–1.2)
CO2: 27 mmol/L (ref 20–29)
Calcium: 9.3 mg/dL (ref 8.7–10.3)
Chloride: 105 mmol/L (ref 96–106)
Creatinine, Ser: 0.96 mg/dL (ref 0.57–1.00)
Globulin, Total: 2.1 g/dL (ref 1.5–4.5)
Glucose: 134 mg/dL — ABNORMAL HIGH (ref 70–99)
Potassium: 4.1 mmol/L (ref 3.5–5.2)
Sodium: 144 mmol/L (ref 134–144)
Total Protein: 6.4 g/dL (ref 6.0–8.5)
eGFR: 66 mL/min/{1.73_m2} (ref >59)

## 2021-04-22 LAB — HEMOGLOBIN A1C
Est. average glucose Bld gHb Est-mCnc: 163 mg/dL
Hgb A1c MFr Bld: 7.3 % — ABNORMAL HIGH (ref 4.8–5.6)

## 2021-04-22 LAB — VITAMIN D 25 HYDROXY (VIT D DEFICIENCY, FRACTURES): Vit D, 25-Hydroxy: 30.5 ng/mL (ref 30.0–100.0)

## 2021-04-24 DIAGNOSIS — E7800 Pure hypercholesterolemia, unspecified: Secondary | ICD-10-CM | POA: Diagnosis not present

## 2021-04-24 DIAGNOSIS — Z886 Allergy status to analgesic agent status: Secondary | ICD-10-CM | POA: Diagnosis not present

## 2021-04-24 DIAGNOSIS — Z884 Allergy status to anesthetic agent status: Secondary | ICD-10-CM | POA: Diagnosis not present

## 2021-04-24 DIAGNOSIS — Z880 Allergy status to penicillin: Secondary | ICD-10-CM | POA: Diagnosis not present

## 2021-04-24 DIAGNOSIS — E1140 Type 2 diabetes mellitus with diabetic neuropathy, unspecified: Secondary | ICD-10-CM | POA: Diagnosis not present

## 2021-04-24 DIAGNOSIS — Z9109 Other allergy status, other than to drugs and biological substances: Secondary | ICD-10-CM | POA: Diagnosis not present

## 2021-04-24 DIAGNOSIS — G243 Spasmodic torticollis: Secondary | ICD-10-CM | POA: Diagnosis not present

## 2021-04-24 DIAGNOSIS — I10 Essential (primary) hypertension: Secondary | ICD-10-CM | POA: Diagnosis not present

## 2021-04-24 DIAGNOSIS — Z882 Allergy status to sulfonamides status: Secondary | ICD-10-CM | POA: Diagnosis not present

## 2021-04-27 ENCOUNTER — Ambulatory Visit: Payer: Medicare Other | Admitting: Legal Medicine

## 2021-05-02 ENCOUNTER — Telehealth: Payer: Self-pay

## 2021-05-02 NOTE — Chronic Care Management (AMB) (Signed)
    Chronic Care Management Pharmacy Assistant   Name: Suzanne Hardin  MRN: [id number] DOB: August 26, 1955  Reason for Encounter: Patient Assistance Coordination  05/02/2021- 2023 Patient assistance applications filled out for re-enrollments for Trulicity with [employer] Patient Assistance Program, Novolog with [employer] patient assistance program and Toujeo with [employer] Patient assistance program. Called patient to inquire on physical address due to some companies not accepting PO Boxes and also remembering an issue patient had with medication being delivered to the wrong address. Patient was unavailable to talk at the time, she was in a meeting and needed a call back.  Called patient back to inform applications for 2023 were ready to be mailed for signatures, and to verify address. Patient aware, she was in town and would like to stop by the providers office to sign forms. Patient mentioned that she will have a new insurance next year, [employer]. Patient understand that I will e-mail her applications to Renata Caprice, Print production planner of [facility] to print and patient will sign when she arrives. Patient would like all of her assistance medications to be shipped to [facility]. Forms updated and sent to Print production planner.   Medications: Outpatient Encounter Medications as of 05/02/2021  Medication Sig   Alpha-Lipoic Acid 300 MG CAPS Take 300 mg by mouth daily.   baclofen (LIORESAL) 10 MG tablet Take 10 mg by mouth 3 (three) times daily.   Cholecalciferol (VITAMIN D) 50 MCG (2000 UT) tablet Take 2,000 Units by mouth daily.   CONTOUR NEXT TEST test strip USE 1 STRIP TO CHECK GLUCOSE TWICE DAILY   Cyanocobalamin 1000 MCG TBCR Take 1 tablet by mouth daily.   D-Mannose POWD Take by mouth.   Dulaglutide (TRULICITY) 0.75 MG/0.5ML SOPN Inject 0.75 mg into the skin once a week.   glucose blood test strip 1 each by Other route 3 (three) times daily as needed. Use as instructed   hydrOXYzine  (ATARAX) 10 MG/5ML syrup Take by mouth.   ipratropium-albuterol (DUONEB) 0.5-2.5 (3) MG/3ML SOLN Inhale 3 mLs into the lungs every 6 (six) hours as needed.    Lancets MISC by Does not apply route.   NOVOLOG FLEXPEN 100 UNIT/ML FlexPen Patient uses slidding scale. Maximum 30 units uses only blood sugar >250mg /dl. Using 8-10 units when sugars are high lately.   pyridOXINE (VITAMIN B-6) 50 MG tablet Take 25 mg by mouth daily. Takes the p5pb6 version 25 mg daily   Sodium Chloride-Sodium Bicarb (NETI POT SINUS WASH NA) Place into the nose as needed.   TOUJEO SOLOSTAR 300 UNIT/ML Solostar Pen Inject 23 Units into the skin daily. (Patient taking differently: Inject 20 Units into the skin in the morning and at bedtime.)   TRIAMCINOLONE ACETONIDE, TOP, 0.05 % OINT Apply topically daily as needed. Uses as needed for rash, bug bites or on gums before dentist.   Zinc 25 MG TABS Take by mouth.   No facility-administered encounter medications on file as of 05/02/2021.   Billee Cashing, CMA Clinical Pharmacist Assistant [phone]

## 2021-05-15 ENCOUNTER — Encounter: Payer: Self-pay | Admitting: Legal Medicine

## 2021-05-15 ENCOUNTER — Other Ambulatory Visit: Payer: Self-pay

## 2021-05-15 ENCOUNTER — Ambulatory Visit (INDEPENDENT_AMBULATORY_CARE_PROVIDER_SITE_OTHER): Payer: Medicare Other | Admitting: Legal Medicine

## 2021-05-15 VITALS — BP 120/82 | HR 97 | Temp 97.8°F | Resp 16 | Ht 66.5 in | Wt 200.0 lb

## 2021-05-15 DIAGNOSIS — E782 Mixed hyperlipidemia: Secondary | ICD-10-CM

## 2021-05-15 DIAGNOSIS — I10 Essential (primary) hypertension: Secondary | ICD-10-CM

## 2021-05-15 DIAGNOSIS — E1169 Type 2 diabetes mellitus with other specified complication: Secondary | ICD-10-CM | POA: Diagnosis not present

## 2021-05-15 DIAGNOSIS — Z794 Long term (current) use of insulin: Secondary | ICD-10-CM

## 2021-05-15 DIAGNOSIS — Z0000 Encounter for general adult medical examination without abnormal findings: Secondary | ICD-10-CM

## 2021-05-15 NOTE — Patient Instructions (Signed)

## 2021-05-15 NOTE — Progress Notes (Signed)
Subjective:   Suzanne Hardin is a 65 y.o. female who presents for an Initial Medicare Annual Wellness Visit.    Review of Systems    Review of Systems  Constitutional:  Negative for chills and fever.  HENT:  Negative for hearing loss and tinnitus.   Eyes:  Negative for redness.  Respiratory:  Negative for cough and hemoptysis.   Cardiovascular:  Negative for chest pain and claudication.  Genitourinary:  Negative for dysuria.  Musculoskeletal:  Negative for myalgias and neck pain.  Neurological: Negative.   Psychiatric/Behavioral:  Negative for depression.    Cardiac Risk Factors include: diabetes mellitus     Objective:    Today's Vitals   05/15/21 1115  BP: 120/82  Pulse: 97  Resp: 16  Temp: 97.8 F (36.6 C)  SpO2: 98%  Weight: 200 lb (90.7 kg)  Height: 5' 6.5" (1.689 m)   Body mass index is 31.8 kg/m.  Advanced Directives 05/15/2021  Does Patient Have a Medical Advance Directive? Yes  Type of Estate agent of [place];Living will  Copy of Healthcare Power of Attorney in Chart? No - copy requested    Current Medications (verified) Outpatient Encounter Medications as of 05/15/2021  Medication Sig   Alpha-Lipoic Acid 300 MG CAPS Take 300 mg by mouth daily.   baclofen (LIORESAL) 10 MG tablet Take 10 mg by mouth 3 (three) times daily.   Cholecalciferol (VITAMIN D) 50 MCG (2000 UT) tablet Take 2,000 Units by mouth daily.   CONTOUR NEXT TEST test strip USE 1 STRIP TO CHECK GLUCOSE TWICE DAILY   Cyanocobalamin 1000 MCG TBCR Take 1 tablet by mouth daily.   D-Mannose POWD Take by mouth.   Dulaglutide (TRULICITY) 0.75 MG/0.5ML SOPN Inject 0.75 mg into the skin once a week.   glucose blood test strip 1 each by Other route 3 (three) times daily as needed. Use as instructed   hydrOXYzine (ATARAX) 10 MG/5ML syrup Take by mouth.   ipratropium-albuterol (DUONEB) 0.5-2.5 (3) MG/3ML SOLN Inhale 3 mLs into the lungs every 6 (six) hours as needed.     Lancets MISC by Does not apply route.   NOVOLOG FLEXPEN 100 UNIT/ML FlexPen Patient uses slidding scale. Maximum 30 units uses only blood sugar >250mg /dl. Using 8-10 units when sugars are high lately.   pyridOXINE (VITAMIN B-6) 50 MG tablet Take 25 mg by mouth daily. Takes the p5pb6 version 25 mg daily   Sodium Chloride-Sodium Bicarb (NETI POT SINUS WASH NA) Place into the nose as needed.   TOUJEO SOLOSTAR 300 UNIT/ML Solostar Pen Inject 23 Units into the skin daily. (Patient taking differently: Inject 20 Units into the skin in the morning and at bedtime.)   TRIAMCINOLONE ACETONIDE, TOP, 0.05 % OINT Apply topically daily as needed. Uses as needed for rash, bug bites or on gums before dentist.   Zinc 25 MG TABS Take by mouth.   No facility-administered encounter medications on file as of 05/15/2021.    Allergies (verified) Dextromethorphan-guaifenesin, Dorzolamide hcl-timolol mal, Hydrocodone, Lidocaine, Other, Sodium laureth sulfate, Sulfa antibiotics, Sulfites, Articaine-epinephrine, Cymbalta [duloxetine hcl], Darvon [propoxyphene], Duloxetine, Gentamicin sulfate, Hctz [hydrochlorothiazide], Humalog [insulin lispro], Keflex [cephalexin], Lasix [furosemide], Lisinopril-hydrochlorothiazide, Loratadine, Macrodantin [nitrofurantoin], Metformin, Mucinex [guaifenesin er], Peanut-containing drug products, Penicillins, Pioglitazone, Tessalon perles [benzonatate], Travatan [travoprost], Tylenol [acetaminophen], Propofol, and Sulfamethoxazole-trimethoprim   History: Past Medical History:  Diagnosis Date   Allergy history, peanuts 09/04/2012   Carotid artery occlusion 10/28/2013   Cervical dystonia 04/03/2017   Cystitis    Essential hypertension  09/16/2019   Hypertension    Moderate persistent asthma 09/16/2019   Neuropathy, peripheral 05/12/2014   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 Food,  (peanuts, bell peppers, raw onions,pickles) Drug, and Skin, environmental, fragrances & air freshners   Type 2 diabetes mellitus with other specified complication (HCC) 09/16/2019   Past Surgical History:  Procedure Laterality Date   APPENDECTOMY  2000   CATARACT EXTRACTION, BILATERAL     CHOLECYSTECTOMY  2000   TONSILLECTOMY AND ADENOIDECTOMY     Family History  Problem Relation Age of Onset   Ulcers Father    Peripheral Artery Disease Father    Social History   Socioeconomic History   Marital status: Divorced    Spouse name: Not on file   Number of children: 2   Years of education: Not on file   Highest education level: Not on file  Occupational History   Occupation: Disabled  Tobacco Use   Smoking status: Never   Smokeless tobacco: Never  Vaping Use   Vaping Use: Never used  Substance and Sexual Activity   Alcohol use: Never   Drug use: Never   Sexual activity: Yes    Partners: Male  Other Topics Concern   Not on file  Social History Narrative   Not on file   Social Determinants of Health   Financial Resource Strain: Not on file  Food Insecurity: No Food Insecurity   Worried About Running Out of Food in the Last Year: Never true   Ran Out of Food in the Last Year: Never true  Transportation Needs: No Transportation Needs   Lack of Transportation (Medical): No   Lack of Transportation (Non-Medical): No  Physical Activity: Sufficiently Active   Days of Exercise per Week: 5 days   Minutes of Exercise per Session: 30 min  Stress: Not on file  Social Connections: Not on file    Tobacco Counseling Counseling given: Not Answered   Clinical Intake:  Pre-visit preparation completed: Yes  Pain : No/denies pain     BMI - recorded: 31.8 Nutritional Status: BMI > 30  Obese Nutritional Risks: None  How often do you need to have someone help you when you read instructions, pamphlets, or other written materials from your doctor or pharmacy?: 1 -  Never  Diabetic?yes  Interpreter Needed?: No      Activities of Daily Living In your present state of health, do you have any difficulty performing the following activities: 05/15/2021  Hearing? N  Vision? N  Difficulty concentrating or making decisions? N  Walking or climbing stairs? N  Dressing or bathing? N  Doing errands, shopping? N  Preparing Food and eating ? N  Using the Toilet? N  In the past six months, have you accidently leaked urine? N  Do you have problems with loss of bowel control? N  Managing your Medications? N  Managing your Finances? N  Housekeeping or managing your Housekeeping? N  Some recent data might be hidden    Patient Care Team: Abigail Miyamoto, MD as PCP - General (Family Medicine) Earvin Hansen, [facility] (Inactive) as Pharmacist (Pharmacist)  Indicate any recent Medical Services you may have received from other than [facility] providers in the past year (date may be approximate).     Assessment:   This is a routine wellness examination for Zyanya.  Hearing/Vision screen No results found.  Dietary issues and exercise activities discussed: Current Exercise Habits: Structured exercise class, Type of exercise: Other - see comments, Time (Minutes): 60, Frequency (Times/Week): 5, Weekly Exercise (Minutes/Week): 300, Intensity: Moderate, Exercise limited by: None identified   Goals Addressed   None   Depression Screen PHQ 2/9 Scores 05/15/2021 02/10/2021 01/15/2020  PHQ - 2 Score 0 0 0    Fall Risk Fall Risk  05/15/2021 02/10/2021 01/15/2020  Falls in the past year? 0 0 0  Number falls in past yr: 0 0 0  Injury with Fall? 0 0 0  Risk for fall due to : History of fall(s) - -  Follow up Falls prevention discussed - Falls evaluation completed    FALL RISK PREVENTION PERTAINING TO THE HOME:  Any stairs in or around the home? Yes  If so, are there any without handrails? No  Home free of loose throw rugs in walkways, pet beds, electrical cords,  etc? No  Adequate lighting in your home to reduce risk of falls? No   ASSISTIVE DEVICES UTILIZED TO PREVENT FALLS:  Life alert? No  Use of a cane, walker or w/c? No  Grab bars in the bathroom? No  Shower chair or bench in shower? No  Elevated toilet seat or a handicapped toilet? No   TIMED UP AND GO:  Was the test performed? Yes .  Length of time to ambulate 10 feet: 2 sec.   Gait steady and fast with assistive device  Cognitive Function:impared     6CIT Screen 05/15/2021  What Year? 0 points  What month? 0 points  What time? 0 points  Count back from 20 0 points  Months in reverse 0 points  Repeat phrase 10 points  Total Score 10    Immunizations Immunization History  Administered Date(s) Administered   Influenza-Unspecified 04/09/2013    TDAP status: Due, Education has been provided regarding the importance of this vaccine. Advised may receive this vaccine at local pharmacy or Health Dept. Aware to provide a copy of the vaccination record if obtained from local pharmacy or Health Dept. Verbalized acceptance and understanding.  Flu Vaccine status: Declined, Education has been provided regarding the importance of this vaccine but patient still declined. Advised may receive this vaccine at local pharmacy or Health Dept. Aware to provide a copy of the vaccination record if obtained from local pharmacy or Health Dept. Verbalized acceptance and understanding.  Pneumococcal vaccine status: Declined,  Education has been provided regarding the importance of this vaccine but patient still declined. Advised may receive this vaccine at local pharmacy or Health Dept. Aware to provide a copy of the vaccination record if obtained from local pharmacy or Health Dept. Verbalized acceptance and understanding.   Covid-19 vaccine status: Declined, Education has been provided regarding the importance of this vaccine but patient still declined. Advised may receive this vaccine at local pharmacy  or Health Dept.or vaccine clinic. Aware to provide a copy of the vaccination record if obtained from local pharmacy or Health Dept. Verbalized acceptance and understanding.  Qualifies for Shingles Vaccine? No   Zostavax completed No   Shingrix Completed?: No.    Education has been provided regarding the importance of this vaccine. Patient has been advised to call insurance company to determine out of pocket expense if they have not yet received this vaccine. Advised may also receive vaccine at local pharmacy or Health Dept. Verbalized acceptance and understanding.  Screening Tests Health Maintenance  Topic Date Due   Pneumococcal Vaccine 19-64 Years  old (1 - PCV) Never done   OPHTHALMOLOGY EXAM  Never done   HIV Screening  Never done   Hepatitis C Screening  Never done   Zoster Vaccines- Shingrix (1 of 2) Never done   MAMMOGRAM  06/08/2021   HEMOGLOBIN A1C  10/20/2021   URINE MICROALBUMIN  12/28/2021   FOOT EXAM  04/21/2022   PAP SMEAR-Modifier  01/15/2023   Fecal DNA (Cologuard)  01/31/2024   HPV VACCINES  Aged Out   COLONOSCOPY (Pts 45-53yrs Insurance coverage will need to be confirmed)  Discontinued   TETANUS/TDAP  Discontinued   COVID-19 Vaccine  Discontinued    Health Maintenance  Health Maintenance Due  Topic Date Due   Pneumococcal Vaccine 33-61 Years old (1 - PCV) Never done   OPHTHALMOLOGY EXAM  Never done   HIV Screening  Never done   Hepatitis C Screening  Never done   Zoster Vaccines- Shingrix (1 of 2) Never done    Colorectal cancer screening: No longer required.   Mammogram status: Ordered december. Pt provided with contact info and advised to call to schedule appt.     Lung Cancer Screening: (Low Dose CT Chest recommended if Age 47-80 years, 30 pack-year currently smoking OR have quit w/in 15years.) does not qualify.   Lung Cancer Screening Referral: na  Additional Screening:  Hepatitis C Screening: does not qualify; Completed na  Vision Screening:  Recommended annual ophthalmology exams for early detection of glaucoma and other disorders of the eye. Is the patient up to date with their annual eye exam?  Yes  Who is the provider or what is the name of the office in which the patient attends annual eye exams? yes If pt is not established with a provider, would they like to be referred to a provider to establish care? Yes .   Dental Screening: Recommended annual dental exams for proper oral hygiene  Community Resource Referral / Chronic Care Management: CRR required this visit?  No   CCM required this visit?  No      Plan:     I have personally reviewed and noted the following in the patient's chart:   Medical and social history Use of alcohol, tobacco or illicit drugs  Current medications and supplements including opioid prescriptions. Patient is not currently taking opioid prescriptions. Functional ability and status Nutritional status Physical activity Advanced directives List of other physicians Hospitalizations, surgeries, and ER visits in previous 12 months Vitals Screenings to include cognitive, depression, and falls Referrals and appointments  In addition, I have reviewed and discussed with patient certain preventive protocols, quality metrics, and best practice recommendations. A written personalized care plan for preventive services as well as general preventive health recommendations were provided to patient.     Brent Bulla, MD   05/15/2021   Nurse Notes: reviewed by Dr. Marina Goodell

## 2021-05-29 ENCOUNTER — Ambulatory Visit (INDEPENDENT_AMBULATORY_CARE_PROVIDER_SITE_OTHER): Payer: Medicare Other

## 2021-05-29 ENCOUNTER — Other Ambulatory Visit: Payer: Self-pay

## 2021-05-29 DIAGNOSIS — E1169 Type 2 diabetes mellitus with other specified complication: Secondary | ICD-10-CM

## 2021-05-29 DIAGNOSIS — Z794 Long term (current) use of insulin: Secondary | ICD-10-CM

## 2021-05-29 DIAGNOSIS — E782 Mixed hyperlipidemia: Secondary | ICD-10-CM

## 2021-05-29 DIAGNOSIS — I10 Essential (primary) hypertension: Secondary | ICD-10-CM

## 2021-05-29 NOTE — Patient Instructions (Signed)
Visit Information   Goals Addressed   None    Patient Care Plan: CCM Pharmacy Care Plan     Problem Identified: hld, dm, htn   Priority: High  Onset Date: 08/29/2020     Long-Range Goal: Disease Management   Start Date: 08/29/2020  Expected End Date: 08/29/2021  Recent Progress: On track  Priority: High  Note:   Pharmacist Clinical Goal(s):  Over the next 90 days, patient will verbalize ability to afford treatment regimen achieve control of diabetes as evidenced by a1c and blood glucose through collaboration with PharmD and provider.   Interventions: 1:1 collaboration with Suzanne Anes, MD regarding development and update of comprehensive plan of care as evidenced by provider attestation and co-signature Inter-disciplinary care team collaboration (see longitudinal plan of care) Comprehensive medication review performed; medication list updated in electronic medical record  Hypertension (BP goal <130/80) -Controlled -Current treatment: Diet and lifestyle -Medications previously tried: lisinopril, hydrochlorothiazide  -Current home readings: 115/60 -Current dietary habits: reports increased blood sugar readings from deviating from normal diet -Current exercise habits: pool exercise therapy daily -Denies hypotensive/hypertensive symptoms -Educated on BP goals and benefits of medications for prevention of heart attack, stroke and kidney damage; Daily salt intake goal < 2300 mg; Exercise goal of 150 minutes per week; Importance of home blood pressure monitoring; Symptoms of hypotension and importance of maintaining adequate hydration; -Counseled to monitor BP at home monthly, document, and provide log at future appointments -Counseled on diet and exercise extensively  Hyperlipidemia: (LDL goal < 70) -Not ideally controlled -Current treatment: Diet and lifestyle  -Medications previously tried: none reported -Current dietary patterns: has varied from her normal a  little more than normal but getting back on track.  -Current exercise habits: daily pool rehab exercise.  -Educated on Cholesterol goals;  Importance of limiting foods high in cholesterol; Exercise goal of 150 minutes per week; -Counseled on diet and exercise extensively  Diabetes (A1c goal <8%) Lab Results  Component Value Date   HGBA1C 7.3 (H) 04/21/2021   HGBA1C 7.4 (H) 12/28/2020   HGBA1C 7.7 (H) 08/26/2020   Lab Results  Component Value Date   MICROALBUR 80 12/28/2020   LDLCALC 122 (H) 04/21/2021   CREATININE 0.96 04/21/2021   Lab Results  Component Value Date   NA 144 04/21/2021   K 4.1 04/21/2021   CREATININE 0.96 04/21/2021   EGFR 66 04/21/2021   GFRNONAA 67 05/17/2020   GLUCOSE 134 (H) 04/21/2021   Lab Results  Component Value Date   WBC 7.7 04/21/2021   HGB 14.6 04/21/2021   HCT 42.4 04/21/2021   MCV 88 04/21/2021   PLT 311 04/21/2021  -Controlled -Current medications: Trulicity 2.99 mg weekly  Novolog sliding scale before meals if needed - not using regularly Toujeo 20-23 units twice daily  -Medications previously tried: metformin, Victoza, Humalog (Allergic to an expedient in most injectables) -Current home glucose readings fasting glucose:  July 2022: 100-160 mg/dL December 2022: Patient didn't want to read values, just told me they were in the 300's this weekend but that's because they went to a christmas party and she had cookies. Stated they aren't normally that high -Denies hypoglycemic/hyperglycemic symptoms -Current meal patterns:  Diet: states that she knows variations and her diet is why her a1c isn't better than it is  drinks: milk, water, sage tea, ginger tea -Current exercise: daily pool exercise for rehab -Educated onA1c and blood sugar goals; Complications of diabetes including kidney damage, retinal damage, and cardiovascular disease; Exercise goal  of 150 minutes per week; Benefits of weight loss; Benefits of routine self-monitoring of  blood sugar; Carbohydrate counting and/or plate method -Counseled to check feet daily and get yearly eye exams -Counseled on diet and exercise extensively December 2022: Patient told me, "I was told that the Trulicity should be increased, but I'm afraid of going below 90. If I go below 90, I start to shake and have horrible tremors." -Because patient has a bad Hx with Hypoglycemia, best goal is A1c<8 (Especially since once she turns 66 in a year, that will be her goal). Because of that, recommend maintaining status quo. If further treatment escalation needed, Trulicity would be next option and I explained how that has less Hypo than Toujeo. Patient affirmed understanding  Health Maintenance -Vaccine gaps: contraindications to flu or COVID -Current therapy:  cyanocobalamin 1000 mcg daily  Alpha-lipoic acid 300 mg daily  D-mannose powder  Vitamin D 2000 units daily  Zinc 50 mg daily Monday-Friday Vitamin b-6 25 mg daily  -Educated on Herbal supplement research is limited and benefits usually cannot be proven -Patient is satisfied with current therapy and denies issues -Counseled on diet and exercise extensively Recommended to continue current medication   Patient Goals/Self-Care Activities Over the next 90 days, patient will:  - take medications as prescribed focus on medication adherence by using pill box check glucose 3-4 times daily , document, and provide at future appointments target a minimum of 150 minutes of moderate intensity exercise weekly Reduce carbohydrate intake and work to improve diet since COVID and stomach symptoms.   Follow Up Plan: Telephone follow up appointment with care management team member scheduled for: April 2022  Suzanne Hardin, [place].D. - [phone]       The patient verbalized understanding of instructions, educational materials, and care plan provided today and declined offer to receive copy of patient instructions, educational materials, and care  plan.  The pharmacy team will reach out to the patient again over the next 90 days.   Suzanne Hardin, [facility]

## 2021-05-29 NOTE — Progress Notes (Signed)
Chronic Care Management Pharmacy Note  05/29/2021 Name:  Suzanne Hardin MRN:  [id number] DOB:  [birth year]  Summary: Pleasant 65 year old female presents for f/u CCM visit. She worked in [employer], commissioning. She also worked as a Special educational needs teacher. She became disabled by the flu vaccine, she is allergic to an excipient in most injections. 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 She sued Workers comp, was the first person to do so, and won. Because of this she does not get vaccines as they can cause her immense harm. She self-identifies as an extrovert   Plan Updates:  None   Subjective: Suzanne Hardin is an 65 y.o. year old female who is a primary patient of Henrene Pastor, Zeb Comfort, MD.  The CCM team was consulted for assistance with disease management and care coordination needs.    Engaged with patient by telephone for follow up visit in response to provider referral for pharmacy case management and/or care coordination services.   Consent to Services:  The patient was given information about Chronic Care Management services, agreed to services, and gave verbal consent prior to initiation of services.  Please see initial visit note for detailed documentation.   Patient Care Team: Lillard Anes, MD as PCP - General (Family Medicine) Burnice Logan, [facility] (Inactive) as Pharmacist (Pharmacist)  Recent office visits: 12/28/2020 - cologuard ordered. Monitor bp and blood sugars. Thyroid panel normal. CBC normal. CMP stable. A1c 7.4%. Lipid panel: LDL elevated 126.   11/01/20-ophthalmology, glaucoma evaluation   10/24/20-Dr. [facility] PCP-UTI-start Doxycycline,   08/26/2020 - Cbc all normal, glucose 168, kidney and liver tests normal, A1c 7.7 good  08/08/2020 - acute cystitis with hematuria. E. Coli sensitive to tetracycline. Fluconazole prescribed.   06/21/2020 - abnormal urialysis and urine culture. Cirpo and fluconazole given.    Recent consult visits:  11/03/20-ophthalmology, Patient called this morning upset after being prescribed Cosopt. She states it has an ingredient that she is allergic to. She states that she's having a change in vitals(decreased heart rate). She feels as though she's having flu like symptoms(freezing) and her throat is red, and has raised bumps. Patient has discontinued the medication. She states that she will also send Dr. Pennie Banter a message via mychart.    10/03/20-[facility] ophthalmology,  Raised intraocular pressure of both eyes, Botox injection  for cervical dystonia   07/04/2020 - neurology - botox injections.  Hospital visits: None in previous 6 months  Objective:  Lab Results  Component Value Date   CREATININE 0.96 04/21/2021   BUN 8 04/21/2021   GFRNONAA 67 05/17/2020   GFRAA 78 05/17/2020   NA 144 04/21/2021   K 4.1 04/21/2021   CALCIUM 9.3 04/21/2021   CO2 27 04/21/2021    Lab Results  Component Value Date/Time   HGBA1C 7.3 (H) 04/21/2021 09:12 AM   HGBA1C 7.4 (H) 12/28/2020 09:59 AM   MICROALBUR 80 12/28/2020 09:49 AM   MICROALBUR 80 01/15/2020 08:28 AM    Last diabetic Eye exam: No results found for: HMDIABEYEEXA  Last diabetic Foot exam: No results found for: HMDIABFOOTEX   Lab Results  Component Value Date   CHOL 199 04/21/2021   HDL 58 04/21/2021   LDLCALC 122 (H) 04/21/2021   TRIG 109 04/21/2021   CHOLHDL 3.4 04/21/2021    Hepatic Function Latest Ref Rng & Units 04/21/2021 12/28/2020 08/26/2020  Total Protein 6.0 - 8.5 g/dL 6.4 6.7 6.6  Albumin 3.8 - 4.8 g/dL 4.3 4.5 4.4  AST 0 - 40 IU/L _0 ALT 0 - 32 IU/L _1 Alk Phosphatase 44 - 121 IU/L 97 98 105  Total Bilirubin 0.0 - 1.2 mg/dL 0.5 0.6 0.6    Lab Results  Component Value Date/Time   TSH 2.560 12/28/2020 10:15 AM   TSH 1.760 01/15/2020 09:07 AM    CBC Latest Ref Rng & Units 04/21/2021 12/28/2020 08/26/2020  WBC 3.4 - 10.8 x10E3/uL 7.7 7.9 8.3  Hemoglobin 11.1 - 15.9 g/dL 14.6 14.5 14.8   Hematocrit 34.0 - 46.6 % 42.4 42.0 44.4  Platelets 150 - 450 x10E3/uL 311 288 311    Lab Results  Component Value Date/Time   VD25OH 30.5 04/21/2021 09:12 AM   VD25OH 28.6 (L) 01/15/2020 09:13 AM    Clinical ASCVD: No  The 10-year ASCVD risk score (Arnett DK, et al., 2019) is: 8.2%   Values used to calculate the score:     Age: 65 years     Sex: Female     Is Non-Hispanic African American: No     Diabetic: Yes     Tobacco smoker: No     Systolic Blood Pressure: 176 mmHg     Is BP treated: No     HDL Cholesterol: 58 mg/dL     Total Cholesterol: 199 mg/dL    Depression screen [facility] 2/9 05/15/2021 02/10/2021 01/15/2020  Decreased Interest 0 0 0  Down, Depressed, Hopeless 0 0 0  PHQ - 2 Score 0 0 0     Social History   Tobacco Use  Smoking Status Never  Smokeless Tobacco Never   BP Readings from Last 3 Encounters:  05/15/21 120/82  04/21/21 135/76  03/24/21 136/78   Pulse Readings from Last 3 Encounters:  05/15/21 97  04/21/21 80  03/24/21 99   Wt Readings from Last 3 Encounters:  05/15/21 200 lb (90.7 kg)  04/21/21 201 lb (91.2 kg)  03/24/21 200 lb (90.7 kg)    Assessment/Interventions: Review of patient past medical history, allergies, medications, health status, including review of consultants reports, laboratory and other test data, was performed as part of comprehensive evaluation and provision of chronic care management services.   SDOH:  (Social Determinants of Health) assessments and interventions performed: Yes SDOH Interventions    Flowsheet Row Most Recent Value  SDOH Interventions   Financial Strain Interventions Other (Comment)  [PAP]       CCM Care Plan  Allergies  Allergen Reactions   Dextromethorphan-Guaifenesin Itching   Dorzolamide Hcl-Timolol Mal Nausea And Vomiting    Brachycardia, severe headache, swelling   Hydrocodone Other (See Comments)    Inflammatory Inflammatory Other reaction(s): Other (See  Comments) Inflammatory Inflammatory    Lidocaine Swelling   Other Rash, Other (See Comments) and Swelling    Neurological damage fragances fragances  Neurological damage Fragances; YELLOW NUMBER 5;  Yellow #5 Yellow #5 Yellow #5 Yellow #5  High eye pressure High eye pressure High eye pressure    Sodium Laureth Sulfate Dermatitis, Hives, Itching, Other (See Comments), Rash and Swelling   Sulfa Antibiotics Hives   Sulfites Shortness Of Breath    Other reaction(s): Shortness of Breath Other reaction(s): Shortness of Breath    Articaine-Epinephrine Swelling   Cymbalta [Duloxetine Hcl]    Darvon [Propoxyphene]    Duloxetine    Gentamicin Sulfate    Hctz [Hydrochlorothiazide]    Humalog [Insulin Lispro]    Keflex [Cephalexin]  Lasix [Furosemide]    Lisinopril-Hydrochlorothiazide    Loratadine    Macrodantin [Nitrofurantoin]    Metformin    Mucinex [Guaifenesin Er]    Peanut-Containing Drug Products    Penicillins    Pioglitazone    Tessalon Perles [Benzonatate]    Travatan [Travoprost]    Tylenol [Acetaminophen]    Propofol Other (See Comments) and Nausea And Vomiting   Sulfamethoxazole-Trimethoprim Rash    Medications Reviewed Today     Reviewed by Lillard Anes, MD (Physician) on 05/15/21 at 1143  Med List Status: <None>   Medication Order Taking? Sig Documenting Provider Last Dose Status Informant  Alpha-Lipoic Acid 300 MG CAPS [id number] Yes Take 300 mg by mouth daily. [provider] Taking Active   baclofen (LIORESAL) 10 MG tablet [id number] Yes Take 10 mg by mouth 3 (three) times daily. [provider] Taking Active   Cholecalciferol (VITAMIN D) 50 MCG (2000 UT) tablet [id number] Yes Take 2,000 Units by mouth daily. [provider] Taking Active   CONTOUR NEXT TEST test strip [id number] Yes USE 1 STRIP TO [place] Lillard Anes, MD Taking Active   Cyanocobalamin 1000 MCG TBCR [id number] Yes Take 1  tablet by mouth daily. [provider] Taking Active   D-Mannose POWD [id number] Yes Take by mouth. [provider] Taking Active   Dulaglutide (TRULICITY) 7.68 TL/5.7WI SOPN [id number] Yes Inject 0.75 mg into the skin once a week. Lillard Anes, MD Taking Active   glucose blood test strip [id number] Yes 1 each by Other route 3 (three) times daily as needed. Use as instructed [provider] Taking Active   hydrOXYzine (ATARAX) 10 MG/5ML syrup [id number] Yes Take by mouth. [provider] Taking Active   ipratropium-albuterol (DUONEB) 0.5-2.5 (3) MG/3ML SOLN [id number] Yes Inhale 3 mLs into the lungs every 6 (six) hours as needed.  [provider] Taking Active   Lancets [place] [id number] Yes by Does not apply route. [provider] Taking Active   NOVOLOG FLEXPEN 100 UNIT/ML FlexPen [id number] Yes Patient uses slidding scale. Maximum 30 units uses only blood sugar >242m/dl. Using 8-10 units when sugars are high lately. PLillard Anes MD Taking Active   pyridOXINE (VITAMIN B-6) 50 MG tablet [id number] Take 25 mg by mouth daily. Takes the p5pb6 version 25 mg daily [provider] Taking Active   Sodium Chloride-Sodium Bicarb (NETI POT SINUS [place]) [id number] Place into the nose as needed. [provider] Taking Active   TOUJEO SOLOSTAR 300 UNIT/ML Solostar Pen [id number] Inject 23 Units into the skin daily.  Patient taking differently: Inject 20 Units into the skin in the morning and at bedtime.   PLillard Anes MD Taking Active   TRIAMCINOLONE ACETONIDE, TOP, 0.05 % OINT [id number] Apply topically daily as needed. Uses as needed for rash, bug bites or on gums before dentist. [provider] Taking Active   Zinc 25 MG TABS [id number] Take by mouth. [provider] Taking Active             Patient Active Problem List   Diagnosis Date Noted   Mixed hyperlipidemia  04/21/2021   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   Essential hypertension 09/16/2019   Type 2 diabetes mellitus with other specified complication ([place] 075/88/3254  Moderate persistent asthma 09/16/2019   Vitamin D deficiency 10/16/2012    Immunization History  Administered Date(s) Administered   Influenza-Unspecified 04/09/2013    Conditions to be addressed/monitored:  Hypertension,  Hyperlipidemia, Diabetes and Asthma  Care Plan : [place]  Updates made by Lane Hacker, [place] since 05/29/2021 12:00 AM     Problem: hld, dm, htn   Priority: High  Onset Date: 08/29/2020     Long-Range Goal: Disease Management   Start Date: 08/29/2020  Expected End Date: 08/29/2021  Recent Progress: On track  Priority: High  Note:   Pharmacist Clinical Goal(s):  Over the next 90 days, patient will verbalize ability to afford treatment regimen achieve control of diabetes as evidenced by a1c and blood glucose through collaboration with PharmD and provider.   Interventions: 1:1 collaboration with Lillard Anes, MD regarding development and update of comprehensive plan of care as evidenced by provider attestation and co-signature Inter-disciplinary care team collaboration (see longitudinal plan of care) Comprehensive medication review performed; medication list updated in electronic medical record  Hypertension (BP goal <130/80) -Controlled -Current treatment: Diet and lifestyle -Medications previously tried: lisinopril, hydrochlorothiazide  -Current home readings: 115/60 -Current dietary habits: reports increased blood sugar readings from deviating from normal diet -Current exercise habits: pool exercise therapy daily -Denies hypotensive/hypertensive symptoms -Educated on BP goals and benefits of medications for prevention of heart attack, stroke and kidney damage; Daily salt intake goal < 2300 mg; Exercise goal of 150 minutes per week; Importance of home blood pressure  monitoring; Symptoms of hypotension and importance of maintaining adequate hydration; -Counseled to monitor BP at home monthly, document, and provide log at future appointments -Counseled on diet and exercise extensively  Hyperlipidemia: (LDL goal < 70) -Not ideally controlled -Current treatment: Diet and lifestyle  -Medications previously tried: none reported -Current dietary patterns: has varied from her normal a little more than normal but getting back on track.  -Current exercise habits: daily pool rehab exercise.  -Educated on Cholesterol goals;  Importance of limiting foods high in cholesterol; Exercise goal of 150 minutes per week; -Counseled on diet and exercise extensively  Diabetes (A1c goal <8%) Lab Results  Component Value Date   HGBA1C 7.3 (H) 04/21/2021   HGBA1C 7.4 (H) 12/28/2020   HGBA1C 7.7 (H) 08/26/2020   Lab Results  Component Value Date   MICROALBUR 80 12/28/2020   LDLCALC 122 (H) 04/21/2021   CREATININE 0.96 04/21/2021   Lab Results  Component Value Date   NA 144 04/21/2021   K 4.1 04/21/2021   CREATININE 0.96 04/21/2021   EGFR 66 04/21/2021   GFRNONAA 67 05/17/2020   GLUCOSE 134 (H) 04/21/2021   Lab Results  Component Value Date   WBC 7.7 04/21/2021   HGB 14.6 04/21/2021   HCT 42.4 04/21/2021   MCV 88 04/21/2021   PLT 311 04/21/2021  -Controlled -Current medications: Trulicity 4.96 mg weekly  Novolog sliding scale before meals if needed - not using regularly Toujeo 20-23 units twice daily  -Medications previously tried: metformin, Victoza, Humalog (Allergic to an expedient in most injectables) -Current home glucose readings fasting glucose:  July 2022: 100-160 mg/dL December 2022: Patient didn't want to read values, just told me they were in the 300's this weekend but that's because they went to a christmas party and she had cookies. Stated they aren't normally that high -Denies hypoglycemic/hyperglycemic symptoms -Current meal patterns:   Diet: states that she knows variations and her diet is why her a1c isn't better than it is  drinks: milk, water, sage tea, ginger tea -Current exercise: daily pool exercise for rehab -Educated onA1c and blood sugar goals; Complications of diabetes including kidney damage, retinal damage,  and cardiovascular disease; Exercise goal of 150 minutes per week; Benefits of weight loss; Benefits of routine self-monitoring of blood sugar; Carbohydrate counting and/or plate method -Counseled to check feet daily and get yearly eye exams -Counseled on diet and exercise extensively December 2022: Patient told me, "I was told that the Trulicity should be increased, but I'm afraid of going below 90. If I go below 90, I start to shake and have horrible tremors." -Because patient has a bad Hx with Hypoglycemia, best goal is A1c<8 (Especially since once she turns 66 in a year, that will be her goal). Because of that, recommend maintaining status quo. If further treatment escalation needed, Trulicity would be next option and I explained how that has less Hypo than Toujeo. Patient affirmed understanding  Health Maintenance -Vaccine gaps: contraindications to flu or COVID -Current therapy:  cyanocobalamin 1000 mcg daily  Alpha-lipoic acid 300 mg daily  D-mannose powder  Vitamin D 2000 units daily  Zinc 50 mg daily Monday-Friday Vitamin b-6 25 mg daily  -Educated on Herbal supplement research is limited and benefits usually cannot be proven -Patient is satisfied with current therapy and denies issues -Counseled on diet and exercise extensively Recommended to continue current medication   Patient Goals/Self-Care Activities Over the next 90 days, patient will:  - take medications as prescribed focus on medication adherence by using pill box check glucose 3-4 times daily , document, and provide at future appointments target a minimum of 150 minutes of moderate intensity exercise weekly Reduce carbohydrate  intake and work to improve diet since COVID and stomach symptoms.   Follow Up Plan: Telephone follow up appointment with care management team member scheduled for: April 2022  [place] Constable, [place].D. - [phone]        Medication Assistance:  Trulicity obtained through [facility] Patient assistance and Novonordisk medication assistance program.  Enrollment ends 06/24/2021  Patient's preferred pharmacy is:  [facility] [place], [place] - [address] AT [address] [place] [address] Phone: [phone] Fax: [phone]  [place], [place] [address] [place] [address] Phone: [phone] Fax: [phone]  Uses pill box? Yes Pt endorses good compliance  We discussed: Current pharmacy is preferred with insurance plan and patient is satisfied with pharmacy services Patient decided to: Continue current medication management strategy  Care Plan and Follow Up Patient Decision:  Patient agrees to Care Plan and Follow-up.  Plan: Telephone follow up appointment with care management team member scheduled for:  April 2023  [place] Constable, [place].D. - [phone]

## 2021-06-24 DIAGNOSIS — Z794 Long term (current) use of insulin: Secondary | ICD-10-CM | POA: Diagnosis not present

## 2021-06-24 DIAGNOSIS — I10 Essential (primary) hypertension: Secondary | ICD-10-CM

## 2021-06-24 DIAGNOSIS — E1169 Type 2 diabetes mellitus with other specified complication: Secondary | ICD-10-CM | POA: Diagnosis not present

## 2021-06-24 DIAGNOSIS — E782 Mixed hyperlipidemia: Secondary | ICD-10-CM

## 2021-07-12 ENCOUNTER — Telehealth: Payer: Self-pay

## 2021-07-12 ENCOUNTER — Other Ambulatory Visit: Payer: Self-pay | Admitting: Legal Medicine

## 2021-07-12 DIAGNOSIS — E1169 Type 2 diabetes mellitus with other specified complication: Secondary | ICD-10-CM

## 2021-07-12 MED ORDER — TRULICITY 1.5 MG/0.5ML ~~LOC~~ SOAJ: 1.5000 mg | SUBCUTANEOUS | 2 refills | Status: DC

## 2021-07-12 NOTE — Telephone Encounter (Signed)
Patient had questions regarding PAP's of her Trulicity and Toujeo. Gave her phone numbers to call  She also stated her sugars are higher than normal (Up to 145 max fasting) and would like to increase her Trulicity from 0.75mg /week. Will ask PCP

## 2021-07-14 NOTE — Telephone Encounter (Signed)
Dr. Henrene Pastor approved increase of Trulicity to 1.5mg /week. Called and spoke to patient, she was very confused and very upset because script was sent to [facility] and she thought she had to pay it. Explained that it was sent as a back-up. She told me she was approved for Trulicity PAP when she called but that she was also upset the company hadn't reached out to her. I explained that they aren't going to reach out, that you have to call them for fills. She affirmed understanding.  Will ask [facility] to send new 1.5mg /week script to [facility]

## 2021-07-17 ENCOUNTER — Other Ambulatory Visit: Payer: Self-pay

## 2021-07-17 DIAGNOSIS — E1169 Type 2 diabetes mellitus with other specified complication: Secondary | ICD-10-CM

## 2021-07-17 DIAGNOSIS — Z794 Long term (current) use of insulin: Secondary | ICD-10-CM

## 2021-07-17 MED ORDER — TRULICITY 1.5 MG/0.5ML ~~LOC~~ SOAJ: 1.5000 mg | SUBCUTANEOUS | 2 refills | Status: DC

## 2021-07-24 DIAGNOSIS — Z91010 Allergy to peanuts: Secondary | ICD-10-CM | POA: Diagnosis not present

## 2021-07-24 DIAGNOSIS — Z882 Allergy status to sulfonamides status: Secondary | ICD-10-CM | POA: Diagnosis not present

## 2021-07-24 DIAGNOSIS — Z880 Allergy status to penicillin: Secondary | ICD-10-CM | POA: Diagnosis not present

## 2021-07-24 DIAGNOSIS — G243 Spasmodic torticollis: Secondary | ICD-10-CM | POA: Diagnosis not present

## 2021-07-24 DIAGNOSIS — I10 Essential (primary) hypertension: Secondary | ICD-10-CM | POA: Diagnosis not present

## 2021-07-24 DIAGNOSIS — Z9102 Food additives allergy status: Secondary | ICD-10-CM | POA: Diagnosis not present

## 2021-07-24 DIAGNOSIS — E7800 Pure hypercholesterolemia, unspecified: Secondary | ICD-10-CM | POA: Diagnosis not present

## 2021-07-24 DIAGNOSIS — Z888 Allergy status to other drugs, medicaments and biological substances status: Secondary | ICD-10-CM | POA: Diagnosis not present

## 2021-07-24 DIAGNOSIS — E1140 Type 2 diabetes mellitus with diabetic neuropathy, unspecified: Secondary | ICD-10-CM | POA: Diagnosis not present

## 2021-07-24 DIAGNOSIS — K219 Gastro-esophageal reflux disease without esophagitis: Secondary | ICD-10-CM | POA: Diagnosis not present

## 2021-07-24 DIAGNOSIS — Z886 Allergy status to analgesic agent status: Secondary | ICD-10-CM | POA: Diagnosis not present

## 2021-07-26 LAB — HM DIABETES EYE EXAM

## 2021-07-27 ENCOUNTER — Telehealth: Payer: Self-pay

## 2021-07-27 DIAGNOSIS — H40053 Ocular hypertension, bilateral: Secondary | ICD-10-CM | POA: Diagnosis not present

## 2021-07-27 NOTE — Progress Notes (Signed)
Chronic Care Management Pharmacy Assistant   Name: Suzanne Hardin  MRN: [id number] DOB: [birth year]   Reason for Encounter: Disease State call for DM   Recent office visits:  07/12/21 Brent Bulla MD. Orders Only. Increased Trulicity from 0.75mg  to 1.5mg /0.62ml   Recent consult visits:  07/24/21 (Neurology) Raquel Sarna MD. Seen for Cervical Dystonia. Injected Botox 380 units. Ordered Baclofen 10mg  three times daily.   Hospital visits:  None  Medications: Outpatient Encounter Medications as of 07/27/2021  Medication Sig   Alpha-Lipoic Acid 300 MG CAPS Take 300 mg by mouth daily.   baclofen (LIORESAL) 10 MG tablet Take 10 mg by mouth 3 (three) times daily.   Cholecalciferol (VITAMIN D) 50 MCG (2000 UT) tablet Take 2,000 Units by mouth daily.   CONTOUR NEXT TEST test strip USE 1 STRIP TO CHECK GLUCOSE TWICE DAILY   Cyanocobalamin 1000 MCG TBCR Take 1 tablet by mouth daily.   D-Mannose POWD Take by mouth.   Dulaglutide (TRULICITY) 1.5 MG/0.5ML SOPN Inject 1.5 mg into the skin once a week.   glucose blood test strip 1 each by Other route 3 (three) times daily as needed. Use as instructed   hydrOXYzine (ATARAX) 10 MG/5ML syrup Take by mouth.   ipratropium-albuterol (DUONEB) 0.5-2.5 (3) MG/3ML SOLN Inhale 3 mLs into the lungs every 6 (six) hours as needed.    Lancets MISC by Does not apply route.   NOVOLOG FLEXPEN 100 UNIT/ML FlexPen Patient uses slidding scale. Maximum 30 units uses only blood sugar >250mg /dl. Using 8-10 units when sugars are high lately.   pyridOXINE (VITAMIN B-6) 50 MG tablet Take 25 mg by mouth daily. Takes the p5pb6 version 25 mg daily   Sodium Chloride-Sodium Bicarb (NETI POT SINUS WASH NA) Place into the nose as needed.   TOUJEO SOLOSTAR 300 UNIT/ML Solostar Pen Inject 23 Units into the skin daily. (Patient taking differently: Inject 20 Units into the skin in the morning and at bedtime.)   TRIAMCINOLONE ACETONIDE, TOP, 0.05 % OINT Apply topically daily as  needed. Uses as needed for rash, bug bites or on gums before dentist.   Zinc 25 MG TABS Take by mouth.   No facility-administered encounter medications on file as of 07/27/2021.   Recent Relevant Labs: Lab Results  Component Value Date/Time   HGBA1C 7.3 (H) 04/21/2021 09:12 AM   HGBA1C 7.4 (H) 12/28/2020 09:59 AM   MICROALBUR 80 12/28/2020 09:49 AM   MICROALBUR 80 01/15/2020 08:28 AM    Kidney Function Lab Results  Component Value Date/Time   CREATININE 0.96 04/21/2021 09:12 AM   CREATININE 0.87 12/28/2020 09:59 AM   GFRNONAA 67 05/17/2020 09:21 AM   GFRAA 78 05/17/2020 09:21 AM     Current antihyperglycemic regimen:  Trulicity 1.5 mg weekly  Novolog sliding scale before meals if needed - not using regularly Toujeo 20-23 units twice daily  Patient verbally confirms she is taking the above medications as directed. Yes  What recent interventions/DTPs have been made to improve glycemic control:  Increased Trulicity to 1.5mg  weekly   Have there been any recent hospitalizations or ED visits since last visit with CPP? No  Patient denies hypoglycemic symptoms  Patient reports hyperglycemic symptoms excessive thirst   How often are you checking your blood sugar? once daily  What are your blood sugars ranging?  Fasting: 114 07/27/21  On insulin? Yes How many units:20 units in am and pm and sliding scale only as needed   During the week, how often does  your blood glucose drop below 70? Never  Are you checking your feet daily/regularly? Yes  Adherence Review: Is the patient currently on a STATIN medication? No Is the patient currently on ACE/ARB medication? No Does the patient have >5 day gap between last estimated fill dates? CPP to review  Care Gaps: Last eye exam / Retinopathy Screening? Never done  Last Annual Wellness Visit? 05/15/21 Last Diabetic Foot Exam? 04/21/21  Pt declined a follow up appt since she has an March appt with PCP. She will follow up later.    Star Rating Drugs:  Medication:  Last Fill: Day Supply Trulicity   07/12/21 28DS  Roxana Hires, CMA Clinical Pharmacist Assistant  [phone]

## 2021-07-27 NOTE — Telephone Encounter (Signed)
Spoke with patient, she is interested in f/u with CPP in April. She thought Duwayne Heck was trying to schedule appt with Dr. Marina Goodell. Scheduled for April and congratulated patient on great sugar readings

## 2021-08-01 ENCOUNTER — Other Ambulatory Visit: Payer: Self-pay

## 2021-08-01 DIAGNOSIS — E1169 Type 2 diabetes mellitus with other specified complication: Secondary | ICD-10-CM

## 2021-08-01 DIAGNOSIS — Z794 Long term (current) use of insulin: Secondary | ICD-10-CM

## 2021-08-01 MED ORDER — TRULICITY 1.5 MG/0.5ML ~~LOC~~ SOAJ: 1.5000 mg | SUBCUTANEOUS | 2 refills | Status: DC

## 2021-08-22 NOTE — Progress Notes (Signed)
Subjective:  Patient ID: Suzanne Hardin, female    DOB: 25-Aug-1955  Age: 66 y.o. MRN: [id number]  Chief Complaint  Patient presents with   Diabetes   Hypertension   Hyperlipidemia    HPI Patient present with type 2 diabetes.  Specifically, this is type 2, insulin requiring diabetes. Compliance with treatment has been good; patient take medicines as directed, maintains diet and exercise regimen, follows up as directed, and is keeping glucose diary. Current medicines for diabetes Trulicity 1.5 mg weekly, Toujeo 15 units twice a day, Novolog slidding scale .  Patient performs foot exams daily and last ophthalmologic exam was 07/26/2021. Last A1C 7.3%  Patient presents for follow up of hypertension.  Patient tolerating diet well with side effects.  Patient was diagnosed with hypertension 2010 so has been treated for hypertension for 12 years.Patient is working on maintaining diet and exercise regimen and follows up as directed. Complication include none.   Current Outpatient Medications on File Prior to Visit  Medication Sig Dispense Refill   Alpha-Lipoic Acid 200 MG CAPS Take by mouth.     baclofen (LIORESAL) 10 MG tablet Take 10 mg by mouth 3 (three) times daily.     Cholecalciferol (VITAMIN D) 50 MCG (2000 UT) tablet Take 2,000 Units by mouth daily.     CONTOUR NEXT TEST test strip USE 1 STRIP TO CHECK GLUCOSE TWICE DAILY 100 each 6   Cyanocobalamin 1000 MCG TBCR Take 1 tablet by mouth daily.     D-Mannose POWD Take by mouth.     Dulaglutide (TRULICITY) 1.5 MG/0.5ML SOPN Inject 1.5 mg into the skin once a week. 6 mL 2   hydrOXYzine (ATARAX) 10 MG/5ML syrup Take by mouth.     Lancets MISC by Does not apply route.     NOVOLOG FLEXPEN 100 UNIT/ML FlexPen Patient uses slidding scale. Maximum 30 units uses only blood sugar >250mg /dl. Using 8-10 units when sugars are high lately. 15 mL 1   pyridOXINE (VITAMIN B-6) 50 MG tablet Take 25 mg by mouth daily. Takes the p5pb6 version 25 mg daily      Sodium Chloride-Sodium Bicarb (NETI POT SINUS WASH NA) Place into the nose as needed.     TOUJEO SOLOSTAR 300 UNIT/ML Solostar Pen Inject 23 Units into the skin daily. (Patient taking differently: Inject 15 Units into the skin in the morning and at bedtime. 15 units in the morning and 15 in the evening) 6 pen 6   TRIAMCINOLONE ACETONIDE, TOP, 0.05 % OINT Apply topically daily as needed. Uses as needed for rash, bug bites or on gums before dentist.     Zinc 25 MG TABS Take by mouth.     glucose blood test strip 1 each by Other route 3 (three) times daily as needed. One touch verio     No current facility-administered medications on file prior to visit.   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 Food, (peanuts, bell peppers, raw onions,pickles) Drug, and Skin, environmental, fragrances & air freshners   Type 2 diabetes mellitus with other specified complication (HCC) 09/16/2019   Past  Surgical History:  Procedure Laterality Date   APPENDECTOMY  2000   CATARACT EXTRACTION, BILATERAL     CHOLECYSTECTOMY  2000   TONSILLECTOMY AND ADENOIDECTOMY      Family History  Problem Relation Age of Onset   Ulcers Father    Peripheral Artery Disease Father    Social History   Socioeconomic History   Marital status: Divorced    Spouse name: Not on file   Number of children: 2   Years of education: Not on file   Highest education level: Not on file  Occupational History   Occupation: Disabled  Tobacco Use   Smoking status: Never   Smokeless tobacco: Never  Vaping Use   Vaping Use: Never used  Substance  and Sexual Activity   Alcohol use: Never   Drug use: Never   Sexual activity: Yes    Partners: Male  Other Topics Concern   Not on file  Social History Narrative   Not on file   Social Determinants of Health   Financial Resource Strain: High Risk   Difficulty of Paying Living Expenses: Hard  Food Insecurity: No Food Insecurity   Worried About Running Out of Food in the Last Year: Never true   Ran Out of Food in the Last Year: Never true  Transportation Needs: No Transportation Needs   Lack of Transportation (Medical): No   Lack of Transportation (Non-Medical): No  Physical Activity: Sufficiently Active   Days of Exercise per Week: 5 days   Minutes of Exercise per Session: 30 min  Stress: Not on file  Social Connections: Not on file    Review of Systems  Constitutional:  Negative for chills, fatigue and fever.  HENT:  Negative for congestion, ear pain and sore throat.   Eyes:  Negative for visual disturbance.  Respiratory:  Negative for cough and shortness of breath.   Cardiovascular:  Negative for chest pain and palpitations.  Gastrointestinal:  Negative for abdominal pain, constipation, diarrhea, nausea and vomiting.  Endocrine: Negative for polydipsia, polyphagia and polyuria.  Genitourinary:  Negative for difficulty urinating and dysuria.  Musculoskeletal:  Negative for arthralgias, back pain and myalgias.  Skin:  Negative for rash.  Neurological:  Negative for headaches.  Hematological: Negative.   Psychiatric/Behavioral:  Negative for dysphoric mood. The patient is not nervous/anxious.     Objective:  BP 140/80    Pulse 79    Temp 98.1 F (36.7 C)    Resp 15    Ht 5' 6.8" (1.697 m)    Wt 202 lb (91.6 kg)    SpO2 99%    BMI 31.83 kg/m   BP/Weight 08/23/2021 05/15/2021 [id number]  Systolic BP [id number]  Diastolic BP 80 82 76  Wt. (Lbs) 202 200 201  BMI 31.83 31.8 31.96    Physical Exam Vitals reviewed.  Constitutional:      General: She is not in acute  distress.    Appearance: Normal appearance.  HENT:     Head: Normocephalic.     Right Ear: Tympanic membrane normal.     Left Ear: Tympanic membrane normal.     Nose: Nose normal.     Mouth/Throat:     Mouth: Mucous membranes are moist.     Pharynx: Oropharynx is clear.  Eyes:     Extraocular Movements: Extraocular movements intact.     Conjunctiva/sclera: Conjunctivae normal.     Pupils: Pupils are equal, round, and reactive to light.  Cardiovascular:     Rate  and Rhythm: Normal rate and regular rhythm.     Pulses: Normal pulses.     Heart sounds: No murmur heard.   No gallop.  Pulmonary:     Effort: Pulmonary effort is normal. No respiratory distress.     Breath sounds: Normal breath sounds. No wheezing.  Abdominal:     General: Abdomen is flat. Bowel sounds are normal. There is no distension.     Tenderness: There is no abdominal tenderness.  Musculoskeletal:        General: Normal range of motion.     Cervical back: Normal range of motion and neck supple.     Right lower leg: No edema.     Left lower leg: No edema.  Skin:    General: Skin is warm.     Capillary Refill: Capillary refill takes less than 2 seconds.  Neurological:     General: No focal deficit present.     Mental Status: She is alert and oriented to person, place, and time. Mental status is at baseline.     Gait: Gait normal.     Deep Tendon Reflexes: Reflexes normal.    Diabetic Foot Exam - Simple   Simple Foot Form Diabetic Foot exam was performed with the following findings: Yes 08/23/2021  9:09 AM  Visual Inspection No deformities, no ulcerations, no other skin breakdown bilaterally: Yes Sensation Testing Intact to touch and monofilament testing bilaterally: Yes Pulse Check Posterior Tibialis and Dorsalis pulse intact bilaterally: Yes Comments      Lab Results  Component Value Date   WBC 7.7 04/21/2021   HGB 14.6 04/21/2021   HCT 42.4 04/21/2021   PLT 311 04/21/2021   GLUCOSE 134 (H)  04/21/2021   CHOL 199 04/21/2021   TRIG 109 04/21/2021   HDL 58 04/21/2021   LDLCALC 122 (H) 04/21/2021   ALT 18 04/21/2021   AST 22 04/21/2021   NA 144 04/21/2021   K 4.1 04/21/2021   CL 105 04/21/2021   CREATININE 0.96 04/21/2021   BUN 8 04/21/2021   CO2 27 04/21/2021   TSH 2.560 12/28/2020   HGBA1C 7.3 (H) 04/21/2021   MICROALBUR 80 12/28/2020      Assessment & Plan:   Problem List Items Addressed This Visit       Cardiovascular and Mediastinum   Essential hypertension - Primary   Relevant Orders   Comprehensive metabolic panel   CBC with Differential/Platelet An individual hypertension care plan was established and reinforced today.  The patient's status was assessed using clinical findings on exam and labs or diagnostic tests. The patient's success at meeting treatment goals on disease specific evidence-based guidelines and found to be well controlled. SELF MANAGEMENT: The patient and I together assessed ways to personally work towards obtaining the recommended goals. RECOMMENDATIONS: avoid decongestants found in common cold remedies, decrease consumption of alcohol, perform routine monitoring of BP with home BP cuff, exercise, reduction of dietary salt, take medicines as prescribed, try not to miss doses and quit smoking.  Regular exercise and maintaining a healthy weight is needed.  Stress reduction may help. A CLINICAL SUMMARY including written plan identify barriers to care unique to individual due to social or financial issues.  We attempt to mutually creat solutions for individual and family understanding.      Respiratory   Moderate persistent asthma Patient is stable with her asthma     Endocrine   Type 2 diabetes mellitus with other specified complication ([place])   Relevant Orders  Hemoglobin A1c   Microalbumin / creatinine urine ratio An individual care plan for diabetes was established and reinforced today.  The patient's status was assessed using clinical  findings on exam, labs and diagnostic testing. Patient success at meeting goals based on disease specific evidence-based guidelines and found to be good controlled. New glucose meter prescribed Medications were assessed and patient's understanding of the medical issues , including barriers were assessed. Recommend adherence to a diabetic diet, a graduated exercise program, HgbA1c level is checked quarterly, and urine microalbumin performed yearly .  Annual mono-filament sensation testing performed. Lower blood pressure and control hyperlipidemia is important. Get annual eye exams and annual flu shots and smoking cessation discussed.  Self management goals were discussed.      Other   Vitamin D deficiency   Relevant Orders   VITAMIN D 25 Hydroxy (Vit-D Deficiency, Fractures) AN INDIVIDUAL CARE PLAN hypovitaminosis D was established and reinforced today.  The patient's status was assessed using clinical findings on exam, labs, and other diagnostic testing. Patient's success at meeting treatment goals based on disease specific evidence-bassed guidelines and found to be in good control. RECOMMENDATIONS include maintaining present medicines and treatment.    Mixed hyperlipidemia   Relevant Orders   Lipid panel AN INDIVIDUAL CARE PLAN for hyperlipidemia/ cholesterol was established and reinforced today.  The patient's status was assessed using clinical findings on exam, lab and other diagnostic tests. The patient's disease status was assessed based on evidence-based guidelines and found to be fair controlled. MEDICATIONS were reviewed. SELF MANAGEMENT GOALS have been discussed and patient's success at attaining the goal of low cholesterol was assessed. RECOMMENDATION given include regular exercise 3 days a week and low cholesterol/low fat diet. CLINICAL SUMMARY including written plan to identify barriers unique to the patient due to social or economic  reasons was discussed.    Other Visit Diagnoses      Deficiency of vitamin B12       Relevant Orders   Vitamin B12 Patient has low vitamin B12 and on supplements   Screening mammogram for breast cancer       Relevant Orders   MM Digital Screening     .  No orders of the defined types were placed in this encounter.   Orders Placed This Encounter  Procedures   MM Digital Screening   Comprehensive metabolic panel   Hemoglobin A1c   Lipid panel   CBC with Differential/Platelet   Microalbumin / creatinine urine ratio   VITAMIN D 25 Hydroxy (Vit-D Deficiency, Fractures)   Vitamin B12   30 minute visit and review of old records  Follow-up: Return in about 4 months (around 12/23/2021) for fasting.  An After Visit Summary was printed and given to the patient.  Reinaldo Meeker, MD [facility] [phone]

## 2021-08-23 ENCOUNTER — Ambulatory Visit (INDEPENDENT_AMBULATORY_CARE_PROVIDER_SITE_OTHER): Payer: Medicare Other | Admitting: Legal Medicine

## 2021-08-23 ENCOUNTER — Encounter: Payer: Self-pay | Admitting: Legal Medicine

## 2021-08-23 ENCOUNTER — Other Ambulatory Visit: Payer: Self-pay

## 2021-08-23 VITALS — BP 140/80 | HR 79 | Temp 98.1°F | Resp 15 | Ht 66.8 in | Wt 202.0 lb

## 2021-08-23 DIAGNOSIS — I10 Essential (primary) hypertension: Secondary | ICD-10-CM | POA: Diagnosis not present

## 2021-08-23 DIAGNOSIS — E559 Vitamin D deficiency, unspecified: Secondary | ICD-10-CM

## 2021-08-23 DIAGNOSIS — E782 Mixed hyperlipidemia: Secondary | ICD-10-CM | POA: Diagnosis not present

## 2021-08-23 DIAGNOSIS — Z1231 Encounter for screening mammogram for malignant neoplasm of breast: Secondary | ICD-10-CM | POA: Diagnosis not present

## 2021-08-23 DIAGNOSIS — E538 Deficiency of other specified B group vitamins: Secondary | ICD-10-CM | POA: Diagnosis not present

## 2021-08-23 DIAGNOSIS — E1169 Type 2 diabetes mellitus with other specified complication: Secondary | ICD-10-CM

## 2021-08-23 DIAGNOSIS — Z794 Long term (current) use of insulin: Secondary | ICD-10-CM

## 2021-08-23 DIAGNOSIS — J4540 Moderate persistent asthma, uncomplicated: Secondary | ICD-10-CM

## 2021-08-23 LAB — POCT URINALYSIS DIP (CLINITEK)
Bilirubin, UA: NEGATIVE
Blood, UA: NEGATIVE
Glucose, UA: NEGATIVE mg/dL
Ketones, POC UA: NEGATIVE mg/dL
Leukocytes, UA: NEGATIVE
Nitrite, UA: NEGATIVE
POC PROTEIN,UA: 30 — AB
Spec Grav, UA: >=1.03 — AB (ref 1.010–1.025)
Urobilinogen, UA: 0.2 E.U./dL
pH, UA: 5 (ref 5.0–8.0)

## 2021-08-23 MED ORDER — ONETOUCH VERIO W/DEVICE KIT
1.0000 | PACK | Freq: Three times a day (TID) | 0 refills | Status: AC
Start: 2021-08-23 — End: ?

## 2021-08-23 MED ORDER — ONETOUCH VERIO VI STRP: 1.0000 | ORAL_STRIP | Freq: Three times a day (TID) | 12 refills | Status: DC

## 2021-08-23 MED ORDER — ONETOUCH VERIO VI SOLN: 1.0000 | 2 refills | Status: AC

## 2021-08-24 LAB — COMPREHENSIVE METABOLIC PANEL
ALT: 22 IU/L (ref 0–32)
AST: 24 IU/L (ref 0–40)
Albumin/Globulin Ratio: 2.3 — ABNORMAL HIGH (ref 1.2–2.2)
Albumin: 4.4 g/dL (ref 3.8–4.8)
Alkaline Phosphatase: 91 IU/L (ref 44–121)
BUN/Creatinine Ratio: 12 (ref 12–28)
BUN: 11 mg/dL (ref 8–27)
Bilirubin Total: 0.6 mg/dL (ref 0.0–1.2)
CO2: 26 mmol/L (ref 20–29)
Calcium: 9.4 mg/dL (ref 8.7–10.3)
Chloride: 102 mmol/L (ref 96–106)
Creatinine, Ser: 0.9 mg/dL (ref 0.57–1.00)
Globulin, Total: 1.9 g/dL (ref 1.5–4.5)
Glucose: 148 mg/dL — ABNORMAL HIGH (ref 70–99)
Potassium: 4.4 mmol/L (ref 3.5–5.2)
Sodium: 141 mmol/L (ref 134–144)
Total Protein: 6.3 g/dL (ref 6.0–8.5)
eGFR: 71 mL/min/{1.73_m2} (ref >59)

## 2021-08-24 LAB — HEMOGLOBIN A1C
Est. average glucose Bld gHb Est-mCnc: 180 mg/dL
Hgb A1c MFr Bld: 7.9 % — ABNORMAL HIGH (ref 4.8–5.6)

## 2021-08-24 LAB — LIPID PANEL
Chol/HDL Ratio: 3 ratio (ref 0.0–4.4)
Cholesterol, Total: 181 mg/dL (ref 100–199)
HDL: 60 mg/dL (ref >39)
LDL Chol Calc (NIH): 109 mg/dL — ABNORMAL HIGH (ref 0–99)
Triglycerides: 65 mg/dL (ref 0–149)
VLDL Cholesterol Cal: 12 mg/dL (ref 5–40)

## 2021-08-24 LAB — CBC WITH DIFFERENTIAL/PLATELET
Basophils Absolute: 0.1 10*3/uL (ref 0.0–0.2)
Basos: 1 %
EOS (ABSOLUTE): 0.3 10*3/uL (ref 0.0–0.4)
Eos: 4 %
Hematocrit: 43.3 % (ref 34.0–46.6)
Hemoglobin: 14.5 g/dL (ref 11.1–15.9)
Immature Grans (Abs): 0 10*3/uL (ref 0.0–0.1)
Immature Granulocytes: 0 %
Lymphocytes Absolute: 3.3 10*3/uL — ABNORMAL HIGH (ref 0.7–3.1)
Lymphs: 39 %
MCH: 29.8 pg (ref 26.6–33.0)
MCHC: 33.5 g/dL (ref 31.5–35.7)
MCV: 89 fL (ref 79–97)
Monocytes Absolute: 0.4 10*3/uL (ref 0.1–0.9)
Monocytes: 4 %
Neutrophils Absolute: 4.4 10*3/uL (ref 1.4–7.0)
Neutrophils: 52 %
Platelets: 271 10*3/uL (ref 150–450)
RBC: 4.87 x10E6/uL (ref 3.77–5.28)
RDW: 12.9 % (ref 11.7–15.4)
WBC: 8.5 10*3/uL (ref 3.4–10.8)

## 2021-08-24 LAB — MICROALBUMIN / CREATININE URINE RATIO
Creatinine, Urine: 180.4 mg/dL
Microalb/Creat Ratio: 60 mg/g creat — ABNORMAL HIGH (ref 0–29)
Microalbumin, Urine: 108.3 ug/mL

## 2021-08-24 LAB — VITAMIN D 25 HYDROXY (VIT D DEFICIENCY, FRACTURES): Vit D, 25-Hydroxy: 38.6 ng/mL (ref 30.0–100.0)

## 2021-08-24 LAB — VITAMIN B12: Vitamin B-12: 807 pg/mL (ref 232–1245)

## 2021-08-24 LAB — CARDIOVASCULAR RISK ASSESSMENT

## 2021-08-24 NOTE — Progress Notes (Signed)
Glucose 148, kidney tests normal, liver tests normal, A1c 7.9, LDL cholesterol 109 slightly high, CBC normal, microalbumim high 60, recommend 24 hour urine protein collection ?lp

## 2021-09-11 ENCOUNTER — Other Ambulatory Visit: Payer: Self-pay

## 2021-09-11 ENCOUNTER — Encounter: Payer: Self-pay | Admitting: Legal Medicine

## 2021-09-11 ENCOUNTER — Ambulatory Visit (INDEPENDENT_AMBULATORY_CARE_PROVIDER_SITE_OTHER): Payer: Medicare Other | Admitting: Legal Medicine

## 2021-09-11 VITALS — BP 130/80 | HR 101 | Temp 98.2°F | Resp 15 | Ht 66.8 in | Wt 199.0 lb

## 2021-09-11 DIAGNOSIS — R300 Dysuria: Secondary | ICD-10-CM | POA: Diagnosis not present

## 2021-09-11 LAB — POCT URINALYSIS DIP (CLINITEK)
Bilirubin, UA: NEGATIVE
Glucose, UA: NEGATIVE mg/dL
Nitrite, UA: POSITIVE — AB
POC PROTEIN,UA: 100 — AB
Spec Grav, UA: >=1.03 — AB (ref 1.010–1.025)
Urobilinogen, UA: 0.2 E.U./dL
pH, UA: 5 (ref 5.0–8.0)

## 2021-09-11 MED ORDER — CIPROFLOXACIN HCL 500 MG PO TABS: 500.0000 mg | ORAL_TABLET | Freq: Two times a day (BID) | ORAL | 0 refills | Status: AC

## 2021-09-11 MED ORDER — FLUCONAZOLE 150 MG PO TABS: 150.0000 mg | ORAL_TABLET | Freq: Once | ORAL | 0 refills | Status: AC

## 2021-09-11 NOTE — Progress Notes (Signed)
Acute Office Visit  Subjective:    Patient ID: Suzanne Hardin, female    DOB: 1955-11-03, 66 y.o.   MRN: [id number]  Chief Complaint  Patient presents with   Urinary Frequency    HPI: Patient is in today for urinary frequency, bad odor, tired on her back. She would to be checked for UTI. Started today. Positive UTI.  Some chills yesterday.  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 Food, (peanuts, bell peppers, raw onions,pickles) Drug, and Skin, environmental, fragrances & air freshners   Type 2 diabetes mellitus with other specified complication (HCC) 09/16/2019    Past Surgical History:  Procedure Laterality Date   APPENDECTOMY  2000   CATARACT EXTRACTION, BILATERAL     CHOLECYSTECTOMY  2000   TONSILLECTOMY AND ADENOIDECTOMY      Family History  Problem Relation Age of Onset   Ulcers Father    Peripheral Artery Disease Father     Social History   Socioeconomic History   Marital status: Divorced    Spouse name: Not on file   Number of children: 2   Years of education: Not on file   Highest education level: Not on file  Occupational History   Occupation: Disabled  Tobacco Use   Smoking status: Never   Smokeless tobacco: Never  Vaping Use   Vaping Use: Never used  Substance and Sexual Activity   Alcohol use: Never   Drug use: Never   Sexual activity: Yes    Partners: Male  Other Topics Concern   Not on file  Social History Narrative   Not on file   Social Determinants of Health   Financial Resource Strain: High Risk   Difficulty of Paying Living Expenses: Hard   Food Insecurity: Not on file  Transportation Needs: Not on file  Physical Activity: Not on file  Stress: Not on file  Social Connections: Not on file  Intimate Partner Violence: Not on file    Outpatient Medications Prior to Visit  Medication Sig Dispense Refill   Alpha-Lipoic Acid 200 MG CAPS Take by mouth.     baclofen (LIORESAL) 10 MG tablet Take 10 mg by mouth 3 (three) times daily.     Blood Glucose Calibration (ONETOUCH VERIO) SOLN 1 each by In Vitro route every 30 (thirty) days. 1 each 2   Blood Glucose Monitoring Suppl (ONETOUCH VERIO) w/Device KIT 1 each by Does not apply route 3 (three) times daily. 1 kit 0   Cholecalciferol (VITAMIN D) 50 MCG (2000 UT) tablet Take 2,000 Units by mouth daily.     CONTOUR NEXT TEST test strip USE 1 STRIP TO CHECK GLUCOSE TWICE DAILY 100 each 6   Cyanocobalamin 1000 MCG TBCR Take 1 tablet by mouth daily.     D-Mannose POWD Take by mouth.     Dulaglutide (TRULICITY) 1.5 MG/0.5ML SOPN Inject 1.5 mg into the skin once a week. 6 mL 2   glucose blood (ONETOUCH VERIO) test strip 1 each by Other route 3 (three) times daily. Use as instructed 100 each 12   glucose blood test strip 1 each  by Other route 3 (three) times daily as needed. One touch verio     hydrOXYzine (ATARAX) 10 MG/5ML syrup Take by mouth.     Lancets MISC by Does not apply route.     NOVOLOG FLEXPEN 100 UNIT/ML FlexPen Patient uses slidding scale. Maximum 30 units uses only blood sugar >250mg /dl. Using 8-10 units when sugars are high lately. 15 mL 1   pyridOXINE (VITAMIN B-6) 50 MG tablet Take 25 mg by mouth daily. Takes the p5pb6 version 25 mg daily     Sodium Chloride-Sodium Bicarb (NETI POT SINUS WASH NA) Place into the nose as needed.     TOUJEO SOLOSTAR 300 UNIT/ML Solostar Pen Inject 23 Units into the skin daily. (Patient taking differently: Inject 15 Units into the skin in the morning and at bedtime. 15 units in the morning and 15 in the evening) 6 pen 6   TRIAMCINOLONE ACETONIDE,  TOP, 0.05 % OINT Apply topically daily as needed. Uses as needed for rash, bug bites or on gums before dentist.     Zinc 25 MG TABS Take by mouth.     No facility-administered medications prior to visit.    Allergies  Allergen Reactions   Dextromethorphan-Guaifenesin Itching   Dorzolamide Hcl-Timolol Mal Nausea And Vomiting    Brachycardia, severe headache, swelling Other reaction(s): Other (See Comments) Brachycardia, severe headache, swelling Brachycardia, severe headache, swelling   Hydrocodone Other (See Comments)    Inflammatory Inflammatory Other reaction(s): Other (See Comments) Inflammatory Inflammatory    Lidocaine Swelling   Other Rash, Other (See Comments) and Swelling    Neurological damage fragances fragances  Neurological damage Fragances; YELLOW NUMBER 5;  Yellow #5 Yellow #5 Yellow #5 Yellow #5  High eye pressure High eye pressure High eye pressure    Sodium Laureth Sulfate Dermatitis, Hives, Itching, Other (See Comments), Rash and Swelling   Sulfa Antibiotics Hives   Sulfites Shortness Of Breath    Other reaction(s): Shortness of Breath Other reaction(s): Shortness of Breath    Articaine-Epinephrine Swelling   Cymbalta [Duloxetine Hcl]    Darvon [Propoxyphene]    Duloxetine    Gentamicin Sulfate    Hctz [Hydrochlorothiazide]    Humalog [Insulin Lispro]    Keflex [Cephalexin]    Lasix [Furosemide]    Lisinopril-Hydrochlorothiazide    Loratadine    Macrodantin [Nitrofurantoin]    Metformin    Mucinex [Guaifenesin Er]    Peanut-Containing Drug Products    Penicillins    Pioglitazone    Tessalon Perles [Benzonatate]    Travatan [Travoprost]    Tylenol [Acetaminophen]    Propofol Other (See Comments) and Nausea And Vomiting   Sulfamethoxazole-Trimethoprim Rash    Review of Systems  Constitutional:  Negative for chills, fatigue and fever.  HENT:  Negative for congestion, ear pain and sore throat.   Respiratory:  Negative for cough and  shortness of breath.   Cardiovascular:  Negative for chest pain and palpitations.  Gastrointestinal:  Negative for abdominal pain, constipation, diarrhea, nausea and vomiting.  Endocrine: Negative for polydipsia, polyphagia and polyuria.  Genitourinary:  Positive for frequency. Negative for difficulty urinating and dysuria.  Musculoskeletal:  Positive for back pain (more like tiredness on her back). Negative for arthralgias and myalgias.  Skin:  Negative for rash.  Neurological:  Negative for headaches.  Psychiatric/Behavioral:  Negative for dysphoric mood. The patient is not nervous/anxious.       Objective:    Physical Exam Vitals reviewed.  Constitutional:      General: She is  not in acute distress.    Appearance: Normal appearance.  HENT:     Head: Normocephalic.  Eyes:     Extraocular Movements: Extraocular movements intact.     Conjunctiva/sclera: Conjunctivae normal.     Pupils: Pupils are equal, round, and reactive to light.  Cardiovascular:     Rate and Rhythm: Normal rate and regular rhythm.     Pulses: Normal pulses.     Heart sounds: Normal heart sounds. No murmur heard.   No gallop.  Pulmonary:     Effort: Pulmonary effort is normal. No respiratory distress.     Breath sounds: Normal breath sounds. No wheezing.  Abdominal:     General: Abdomen is flat. Bowel sounds are normal. There is no distension.     Tenderness: There is no abdominal tenderness.  Neurological:     General: No focal deficit present.     Mental Status: She is alert and oriented to person, place, and time.    BP 130/80   Pulse (!) 101   Temp 98.2 F (36.8 C)   Resp 15   Ht 5' 6.8" (1.697 m)   Wt 199 lb (90.3 kg)   SpO2 99%   BMI 31.35 kg/m  Wt Readings from Last 3 Encounters:  09/11/21 199 lb (90.3 kg)  08/23/21 202 lb (91.6 kg)  05/15/21 200 lb (90.7 kg)    Health Maintenance Due  Topic Date Due   Hepatitis C Screening  Never done   Zoster Vaccines- Shingrix (1 of 2) Never  done   MAMMOGRAM  06/08/2021    There are no preventive care reminders to display for this patient.   Lab Results  Component Value Date   TSH 2.560 12/28/2020   Lab Results  Component Value Date   WBC 8.5 08/23/2021   HGB 14.5 08/23/2021   HCT 43.3 08/23/2021   MCV 89 08/23/2021   PLT 271 08/23/2021   Lab Results  Component Value Date   NA 141 08/23/2021   K 4.4 08/23/2021   CO2 26 08/23/2021   GLUCOSE 148 (H) 08/23/2021   BUN 11 08/23/2021   CREATININE 0.90 08/23/2021   BILITOT 0.6 08/23/2021   ALKPHOS 91 08/23/2021   AST 24 08/23/2021   ALT 22 08/23/2021   PROT 6.3 08/23/2021   ALBUMIN 4.4 08/23/2021   CALCIUM 9.4 08/23/2021   EGFR 71 08/23/2021   Lab Results  Component Value Date   CHOL 181 08/23/2021   Lab Results  Component Value Date   HDL 60 08/23/2021   Lab Results  Component Value Date   LDLCALC 109 (H) 08/23/2021   Lab Results  Component Value Date   TRIG 65 08/23/2021   Lab Results  Component Value Date   CHOLHDL 3.0 08/23/2021   Lab Results  Component Value Date   HGBA1C 7.9 (H) 08/23/2021       Assessment & Plan:   Problem List Items Addressed This Visit   None Visit Diagnoses     Dysuria    -  Primary   Relevant Medications   ciprofloxacin (CIPRO) 500 MG tablet   fluconazole (DIFLUCAN) 150 MG tablet   Other Relevant Orders   POCT URINALYSIS DIP (CLINITEK) (Completed)   CULTURE, URINE COMPREHENSIVE Patient has uTI, treat with cipro and diflucan fr vaginal yeast      Meds ordered this encounter  Medications   ciprofloxacin (CIPRO) 500 MG tablet    Sig: Take 1 tablet (500 mg total) by mouth 2 (two) times  daily for 7 days.    Dispense:  14 tablet    Refill:  0   fluconazole (DIFLUCAN) 150 MG tablet    Sig: Take 1 tablet (150 mg total) by mouth once for 1 dose. Repeat if necessary    Dispense:  3 tablet    Refill:  0    Orders Placed This Encounter  Procedures   CULTURE, URINE COMPREHENSIVE   POCT URINALYSIS DIP  (CLINITEK)     Follow-up: Return if symptoms worsen or fail to improve.  An After Visit Summary was printed and given to the patient.  Brent Bulla, MD [facility] [phone]

## 2021-09-13 ENCOUNTER — Other Ambulatory Visit: Payer: Self-pay | Admitting: Legal Medicine

## 2021-09-13 DIAGNOSIS — Z1231 Encounter for screening mammogram for malignant neoplasm of breast: Secondary | ICD-10-CM

## 2021-09-14 LAB — CULTURE, URINE COMPREHENSIVE

## 2021-09-14 NOTE — Progress Notes (Signed)
Urine culture sensitive to cipro lp

## 2021-09-20 ENCOUNTER — Ambulatory Visit
Admission: RE | Admit: 2021-09-20 | Discharge: 2021-09-20 | Disposition: A | Discharge status: Home / Self Care | Payer: Medicare Other | Source: Ambulatory Visit | Attending: Legal Medicine | Admitting: Legal Medicine

## 2021-09-20 ENCOUNTER — Other Ambulatory Visit: Payer: Self-pay

## 2021-09-20 DIAGNOSIS — Z1231 Encounter for screening mammogram for malignant neoplasm of breast: Secondary | ICD-10-CM | POA: Diagnosis not present

## 2021-09-21 NOTE — Progress Notes (Signed)
Normal mammogram ?lp

## 2021-09-26 ENCOUNTER — Telehealth: Payer: Self-pay

## 2021-09-26 NOTE — Progress Notes (Signed)
? ? ?Chronic Care Management ?Pharmacy Assistant  ? ?Name: Suzanne Hardin  MRN: [id number] DOB: 1956-05-23 ? ? ?Reason for Encounter: Disease State call for DM  ?  ?Recent office visits:  ?09/11/21 Reinaldo Meeker MD. Seen for Dysuria. Started on Ciprofloxacin HCI 529m and Fluconazole 1551m  ? ?08/23/21 PeReinaldo MeekerD. Seen for Routine Visit. Changed Alpha-Lipoic Acid from 30064mo 200m58mily. ? ?Recent consult visits:  ?None ? ?Hospital visits:  ?None ? ?Medications: ?Outpatient Encounter Medications as of 09/26/2021  ?Medication Sig  ? Alpha-Lipoic Acid 200 MG CAPS Take by mouth.  ? baclofen (LIORESAL) 10 MG tablet Take 10 mg by mouth 3 (three) times daily.  ? Blood Glucose Calibration (ONETOUCH VERIO) SOLN 1 each by In Vitro route every 30 (thirty) days.  ? Blood Glucose Monitoring Suppl (ONETOUCH VERIO) w/Device KIT 1 each by Does not apply route 3 (three) times daily.  ? Cholecalciferol (VITAMIN D) 50 MCG (2000 UT) tablet Take 2,000 Units by mouth daily.  ? CONTOUR NEXT TEST test strip USE 1 STRIP TO CHECK GLUCOSE TWICE DAILY  ? Cyanocobalamin 1000 MCG TBCR Take 1 tablet by mouth daily.  ? D-Mannose POWD Take by mouth.  ? Dulaglutide (TRULICITY) 1.5 MG/0WU/9.8JXN Inject 1.5 mg into the skin once a week.  ? glucose blood (ONETOUCH VERIO) test strip 1 each by Other route 3 (three) times daily. Use as instructed  ? glucose blood test strip 1 each by Other route 3 (three) times daily as needed. One touch verio  ? hydrOXYzine (ATARAX) 10 MG/5ML syrup Take by mouth.  ? Lancets MISC by Does not apply route.  ? NOVOLOG FLEXPEN 100 UNIT/ML FlexPen Patient uses slidding scale. Maximum 30 units uses only blood sugar >250mg10m Using 8-10 units when sugars are high lately.  ? pyridOXINE (VITAMIN B-6) 50 MG tablet Take 25 mg by mouth daily. Takes the p5pb6 version 25 mg daily  ? Sodium Chloride-Sodium Bicarb (NETI POT SINUS WASH [place] into the nose as needed.  ? TOUJEO SOLOSTAR 300 UNIT/ML Solostar Pen Inject 23  Units into the skin daily. (Patient taking differently: Inject 15 Units into the skin in the morning and at bedtime. 15 units in the morning and 15 in the evening)  ? TRIAMCINOLONE ACETONIDE, TOP, 0.05 % OINT Apply topically daily as needed. Uses as needed for rash, bug bites or on gums before dentist.  ? Zinc 25 MG TABS Take by mouth.  ? ?No facility-administered encounter medications on file as of 09/26/2021.  ? ? ?Recent Relevant Labs: ?Lab Results  ?Component Value Date/Time  ? HGBA1C 7.9 (H) 08/23/2021 09:28 AM  ? HGBA1C 7.3 (H) 04/21/2021 09:12 AM  ? MICROALBUR 80 12/28/2020 09:49 AM  ? MICROALBUR 80 01/15/2020 08:28 AM  ?  ?Kidney Function ?Lab Results  ?Component Value Date/Time  ? CREATININE 0.90 08/23/2021 09:28 AM  ? CREATININE 0.96 04/21/2021 09:12 AM  ? GFRNONAA 67 05/17/2020 09:21 AM  ? GFRAA 78 05/17/2020 09:21 AM  ? ? ? ?Current antihyperglycemic regimen:  ?Trulicity 1.5 mg weekly Gets PAP ?Novolog sliding scale before meals if needed - not using regularly ?Toujeo 20-23 units twice daily  ?Patient verbally confirms she is taking the above medications as directed. Yes ? ?What recent interventions/DTPs have been made to improve glycemic control:  ?Pt denies any changes  ? ?Have there been any recent hospitalizations or ED visits since last visit with CPP? No ? ?Patient denies hypoglycemic symptoms, including None ? ?Patient denies hyperglycemic symptoms, including  none ? ?How often are you checking your blood sugar? once daily ? ?What are your blood sugars ranging? 115-130 fasting  ?Fasting: 127 09/26/21 ? ?On insulin? Yes ?How many units:How many units:20 units in am and pm and sliding scale only as needed  ? ?During the week, how often does your blood glucose drop below 70? Never ? ?Are you checking your feet daily/regularly? Yes ? ?Adherence Review: ?Is the patient currently on a STATIN medication? No ?Is the patient currently on ACE/ARB medication? No ?Does the patient have >5 day gap between last  estimated fill dates? CPP to review ? ?Care Gaps: ?Last eye exam / Retinopathy Screening? Never done  ?Last Annual Wellness Visit? 05/15/21 ?Last Diabetic Foot Exam? 04/21/21 ? ?Star Rating Drugs:  ?Medication:  Last Fill: Day Supply ?Trulicity   Gets PAP ? ?Elray Mcgregor, CMA ?Clinical Pharmacist Assistant  ?[phone]  ?

## 2021-09-28 ENCOUNTER — Telehealth: Payer: Medicare Other

## 2021-11-02 ENCOUNTER — Telehealth: Payer: Self-pay

## 2021-11-02 NOTE — Chronic Care Management (AMB) (Addendum)
[employer] patient assistance program notification: ? ?On 11/24/2021 a 120- day supply of Novofine Needles and Novolog FlexPen will be filled and should arrive to the office in 10-14 business days. Patient enrollment will expire on 05/24/2022. ? ?Billee Cashing, CMA ?Clinical Pharmacist Assistant ?[phone] ? ? ?

## 2021-11-03 ENCOUNTER — Ambulatory Visit (INDEPENDENT_AMBULATORY_CARE_PROVIDER_SITE_OTHER): Payer: Medicare Other | Admitting: Physician Assistant

## 2021-11-03 ENCOUNTER — Encounter: Payer: Self-pay | Admitting: Physician Assistant

## 2021-11-03 ENCOUNTER — Telehealth: Payer: Self-pay

## 2021-11-03 VITALS — BP 132/70 | HR 74 | Temp 97.8°F | Resp 18 | Ht 66.8 in | Wt 202.6 lb

## 2021-11-03 DIAGNOSIS — N3001 Acute cystitis with hematuria: Secondary | ICD-10-CM

## 2021-11-03 DIAGNOSIS — R350 Frequency of micturition: Secondary | ICD-10-CM

## 2021-11-03 LAB — POCT URINALYSIS DIP (CLINITEK)
Bilirubin, UA: NEGATIVE
Glucose, UA: NEGATIVE mg/dL
Ketones, POC UA: NEGATIVE mg/dL
Nitrite, UA: POSITIVE — AB
Spec Grav, UA: 1.015 (ref 1.010–1.025)
Urobilinogen, UA: 0.2 E.U./dL
pH, UA: 6 (ref 5.0–8.0)

## 2021-11-03 MED ORDER — DOXYCYCLINE HYCLATE 100 MG PO TABS: 100.0000 mg | ORAL_TABLET | Freq: Two times a day (BID) | ORAL | 0 refills | Status: DC

## 2021-11-03 NOTE — Telephone Encounter (Signed)
[facility] called to let us know there is not doxycycline product without yellow 5 dye. They requested new prescription. Please advice. ?

## 2021-11-03 NOTE — Telephone Encounter (Signed)
The patient adamanatly  and specifically asked for this and stated the pharmacy had ordered for her in the past --- call pt and notify there is no such thing ?Would recommend to send in cipro if pt agreeable ?

## 2021-11-03 NOTE — Progress Notes (Signed)
? ?Acute Office Visit ? ?Subjective:  ? ? Patient ID: Suzanne Hardin, female    DOB: 03/27/1956, 66 y.o.   MRN: [id number] ? ?Chief Complaint  ?Patient presents with  ? Urinary Frequency  ? ? ?HPI: ?Patient is in today for complaints of uti.  She states she has had recurrent UTIS in past - at one point had seen urology but that has been several years ago.  Most recent urine cultures show Klebsiella growth however pt states that her urine 'smells of Ecoli and her cultures in past were sensitive to doxycycline' and requests that medication ?She denies fever, nausea , vomiting ?Of note with her last several UTIs she has not returned for repeat ua to see if infection had totally cleared ?Past Medical History:  ?Diagnosis Date  ? Allergy history, peanuts 09/04/2012  ? Carotid artery occlusion 10/28/2013  ? Cervical dystonia 04/03/2017  ? Cystitis   ? Essential hypertension 09/16/2019  ? Hypertension   ? Moderate persistent asthma 09/16/2019  ? Neuropathy, peripheral 05/12/2014  ? 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 Sciatica of right side   ? 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 Formatting of this note might be different from the original. Food, (peanuts, bell peppers, raw onions,pickles) Drug, and Skin, environmental, fragrances & air freshners  ? Type 2 diabetes mellitus with other specified complication ([place]) 3/79/0240  ? ? ?Past Surgical History:  ?Procedure Laterality Date  ? APPENDECTOMY  2000  ? CATARACT EXTRACTION, BILATERAL    ? CHOLECYSTECTOMY  2000  ? TONSILLECTOMY AND ADENOIDECTOMY    ? ? ?Family History  ?Problem Relation Age of Onset  ? Ulcers Father   ? Peripheral Artery Disease Father   ? Breast cancer Maternal Aunt   ? Breast cancer Maternal Aunt   ? Breast cancer Cousin   ? ? ?Social History  ? ?Socioeconomic History  ? Marital status: Divorced  ?  Spouse name: Not on file  ? Number of children: 2  ? Years of education: Not on file  ? Highest education level: Not on file  ?Occupational History  ? Occupation: Disabled   ?Tobacco Use  ? Smoking status: Never  ? Smokeless tobacco: Never  ?Vaping Use  ? Vaping Use: Never used  ?Substance and Sexual Activity  ? Alcohol use: Never  ? Drug use: Never  ? Sexual activity: Yes  ?  Partners: Male  ?Other Topics Concern  ? Not on file  ?Social History Narrative  ? Not on file  ? ?Social Determinants of Health  ? ?Financial Resource Strain: High Risk  ? Difficulty of Paying Living Expenses: Hard  ?Food Insecurity: Not on file  ?Transportation Needs: Not on file  ?Physical Activity: Not on file  ?Stress: Not on file  ?Social Connections: Not on file  ?Intimate Partner Violence: Not on file  ? ? ?Outpatient Medications Prior to Visit  ?Medication Sig Dispense Refill  ? Alpha-Lipoic Acid 200 MG CAPS Take by mouth.    ? baclofen (LIORESAL) 10 MG tablet Take 10 mg by mouth 3 (three) times daily.    ? Blood Glucose Calibration (ONETOUCH VERIO) SOLN 1 each by In Vitro route every 30 (thirty) days. 1 each 2  ? Blood Glucose Monitoring Suppl (ONETOUCH VERIO) w/Device KIT 1 each by Does not apply route 3 (three) times daily. 1 kit 0  ? Cholecalciferol (VITAMIN D) 50 MCG (2000 UT) tablet Take 2,000 Units by mouth daily.    ? CONTOUR NEXT TEST test strip USE 1 STRIP TO  CHECK GLUCOSE TWICE DAILY 100 each 6  ? Cyanocobalamin 1000 MCG TBCR Take 1 tablet by mouth daily.    ? D-Mannose POWD Take by mouth.    ? Dulaglutide (TRULICITY) 1.5 ML/4.6TK SOPN Inject 1.5 mg into the skin once a week. 6 mL 2  ? glucose blood (ONETOUCH VERIO) test strip 1 each by Other route 3 (three) times daily. Use as instructed 100 each 12  ? glucose blood test strip 1 each by Other route 3 (three) times daily as needed. One touch verio    ? hydrOXYzine (ATARAX) 10 MG/5ML syrup Take by mouth.    ? Lancets MISC by Does not apply route.    ? NOVOLOG FLEXPEN 100 UNIT/ML FlexPen Patient uses slidding scale. Maximum 30 units uses only blood sugar >290m/dl. Using 8-10 units when sugars are high lately. 15 mL 1  ? pyridOXINE (VITAMIN B-6)  50 MG tablet Take 25 mg by mouth daily. Takes the p5pb6 version 25 mg daily    ? Sodium Chloride-Sodium Bicarb (NETI POT SINUS [place]) Place into the nose as needed.    ? TOUJEO SOLOSTAR 300 UNIT/ML Solostar Pen Inject 23 Units into the skin daily. (Patient taking differently: Inject 15 Units into the skin in the morning and at bedtime. 15 units in the morning and 15 in the evening) 6 pen 6  ? TRIAMCINOLONE ACETONIDE, TOP, 0.05 % OINT Apply topically daily as needed. Uses as needed for rash, bug bites or on gums before dentist.    ? Zinc 25 MG TABS Take by mouth.    ? ?No facility-administered medications prior to visit.  ? ? ?Allergies  ?Allergen Reactions  ? Dextromethorphan-Guaifenesin Itching  ? Dorzolamide Hcl-Timolol Mal Nausea And Vomiting  ?  Brachycardia, severe headache, swelling ?Other reaction(s): Other (See Comments) ?Brachycardia, severe headache, swelling ?Brachycardia, severe headache, swelling  ? Hydrocodone Other (See Comments)  ?  Inflammatory ?Inflammatory ?Other reaction(s): Other (See Comments) ?Inflammatory ?Inflammatory ?  ? Lidocaine Swelling  ? Other Rash, Other (See Comments) and Swelling  ?  Neurological damage ?fragances ?fragances ? ?Neurological damage ?Fragances; YELLOW NUMBER 5;  ?Yellow #5 ?Yellow #5 ?Yellow #5 ?Yellow #5 ? ?High eye pressure ?High eye pressure ?High eye pressure ?  ? Sodium Laureth Sulfate Dermatitis, Hives, Itching, Other (See Comments), Rash and Swelling  ? Sulfa Antibiotics Hives  ? Sulfites Shortness Of Breath  ?  Other reaction(s): Shortness of Breath ?Other reaction(s): Shortness of Breath ?  ? Articaine-Epinephrine Swelling  ? Cymbalta [Duloxetine Hcl]   ? Darvon [Propoxyphene]   ? Duloxetine   ? Gentamicin Sulfate   ? Hctz [Hydrochlorothiazide]   ? Humalog [Insulin Lispro]   ? Keflex [Cephalexin]   ? Lasix [Furosemide]   ? Lisinopril-Hydrochlorothiazide   ? Loratadine   ? Macrodantin [Nitrofurantoin]   ? Metformin   ? Mucinex [Guaifenesin Er]   ?  Peanut-Containing Drug Products   ? Penicillins   ? Pioglitazone   ? Tessalon Perles [Benzonatate]   ? Travatan [Travoprost]   ? Tylenol [Acetaminophen]   ? Propofol Other (See Comments) and Nausea And Vomiting  ? Sulfamethoxazole-Trimethoprim Rash  ? ? ?Review of Systems ?CONSTITUTIONAL: Negative for chills, fatigue, fever,  ? ?CARDIOVASCULAR: Negative for chest pain,  ?RESPIRATORY: Negative for recent cough and dyspnea.  ?GASTROINTESTINAL: Negative for abdominal pain,  nausea and vomiting.  ?GU - see HPI ? ?   ?Objective:  ?PHYSICAL EXAM:  ? ?VS: BP 132/70   Pulse 74   Temp 97.8 ?F (36.6 ?  C)   Resp 18   Ht 5' 6.8" (1.697 m)   Wt 202 lb 9.6 oz (91.9 kg)   SpO2 99%   BMI 31.92 kg/m?  ? ?GEN: Well nourished, well developed, in no acute distress  ?HEENT: normal external ears and nose - normal external auditory canals and TMS -   ?Oropharynx - normal mucosa, palate, and posterior pharynx ?Cardiac: RRR; no murmurs,  ?Respiratory:  normal respiratory rate and pattern with no distress - normal breath sounds with no rales, rhonchi, wheezes or rubs ?GI: normal bowel sounds, no masses or tenderness ? ?Office Visit on 11/03/2021  ?Component Date Value Ref Range Status  ? Color, UA 11/03/2021 yellow  yellow Final  ? Clarity, UA 11/03/2021 cloudy (A)  clear Final  ? Glucose, UA 11/03/2021 negative  negative mg/dL Final  ? Bilirubin, UA 11/03/2021 negative  negative Final  ? Ketones, POC UA 11/03/2021 negative  negative mg/dL Final  ? Spec Grav, UA 11/03/2021 1.015  1.010 - 1.025 Final  ? Blood, UA 11/03/2021 trace-intact (A)  negative Final  ? pH, UA 11/03/2021 6.0  5.0 - 8.0 Final  ? POC PROTEIN,UA 11/03/2021 trace  negative, trace Final  ? Urobilinogen, UA 11/03/2021 0.2  0.2 or 1.0 E.U./dL Final  ? Nitrite, UA 11/03/2021 Positive (A)  Negative Final  ? Leukocytes, UA 11/03/2021 Moderate (2+) (A)  Negative Final  ? ? ? ?Lab Results  ?Component Value Date  ? TSH 2.560 12/28/2020  ? ?Lab Results  ?Component Value Date  ?  WBC 8.5 08/23/2021  ? HGB 14.5 08/23/2021  ? HCT 43.3 08/23/2021  ? MCV 89 08/23/2021  ? PLT 271 08/23/2021  ? ?Lab Results  ?Component Value Date  ? NA 141 08/23/2021  ? K 4.4 08/23/2021  ? CO2 26 08/23/2021  ? GL

## 2021-11-06 DIAGNOSIS — Z886 Allergy status to analgesic agent status: Secondary | ICD-10-CM | POA: Diagnosis not present

## 2021-11-06 DIAGNOSIS — Z91018 Allergy to other foods: Secondary | ICD-10-CM | POA: Diagnosis not present

## 2021-11-06 DIAGNOSIS — I10 Essential (primary) hypertension: Secondary | ICD-10-CM | POA: Diagnosis not present

## 2021-11-06 DIAGNOSIS — Z882 Allergy status to sulfonamides status: Secondary | ICD-10-CM | POA: Diagnosis not present

## 2021-11-06 DIAGNOSIS — E1140 Type 2 diabetes mellitus with diabetic neuropathy, unspecified: Secondary | ICD-10-CM | POA: Diagnosis not present

## 2021-11-06 DIAGNOSIS — G243 Spasmodic torticollis: Secondary | ICD-10-CM | POA: Diagnosis not present

## 2021-11-06 DIAGNOSIS — Z880 Allergy status to penicillin: Secondary | ICD-10-CM | POA: Diagnosis not present

## 2021-11-06 DIAGNOSIS — Z91010 Allergy to peanuts: Secondary | ICD-10-CM | POA: Diagnosis not present

## 2021-11-06 DIAGNOSIS — Z881 Allergy status to other antibiotic agents status: Secondary | ICD-10-CM | POA: Diagnosis not present

## 2021-11-06 DIAGNOSIS — Z888 Allergy status to other drugs, medicaments and biological substances status: Secondary | ICD-10-CM | POA: Diagnosis not present

## 2021-11-07 LAB — URINE CULTURE

## 2021-11-24 ENCOUNTER — Telehealth: Payer: Self-pay

## 2021-11-24 NOTE — Progress Notes (Signed)
Chronic Care Management Pharmacy Assistant   Name: Suzanne Hardin  MRN: [id number] DOB: Nov 10, 1955   Reason for Encounter: Disease State call for DM    Recent office visits:  11/03/21 Suzanne Duncans PA-C. Seen for Urinary Frequency. Started on Doxycycline Hyclate 117m.   Recent consult visits:  11/06/21 (Neurology) BLezlie OctaveMD. Seen for Cervical Dystonia. Injected Injected 380 units of Botox. No med changes.   Hospital visits:  None  Medications: Outpatient Encounter Medications as of 11/24/2021  Medication Sig   Alpha-Lipoic Acid 200 MG CAPS Take by mouth.   baclofen (LIORESAL) 10 MG tablet Take 10 mg by mouth 3 (three) times daily.   Blood Glucose Calibration (ONETOUCH VERIO) SOLN 1 each by In Vitro route every 30 (thirty) days.   Blood Glucose Monitoring Suppl (ONETOUCH VERIO) w/Device KIT 1 each by Does not apply route 3 (three) times daily.   Cholecalciferol (VITAMIN D) 50 MCG (2000 UT) tablet Take 2,000 Units by mouth daily.   CONTOUR NEXT TEST test strip USE 1 STRIP TO CHECK GLUCOSE TWICE DAILY   Cyanocobalamin 1000 MCG TBCR Take 1 tablet by mouth daily.   D-Mannose POWD Take by mouth.   doxycycline (VIBRA-TABS) 100 MG tablet Take 1 tablet (100 mg total) by mouth 2 (two) times daily.   Dulaglutide (TRULICITY) 1.5 MYO/4.5TXSOPN Inject 1.5 mg into the skin once a week.   glucose blood (ONETOUCH VERIO) test strip 1 each by Other route 3 (three) times daily. Use as instructed   glucose blood test strip 1 each by Other route 3 (three) times daily as needed. One touch verio   hydrOXYzine (ATARAX) 10 MG/5ML syrup Take by mouth.   Lancets MISC by Does not apply route.   NOVOLOG FLEXPEN 100 UNIT/ML FlexPen Patient uses slidding scale. Maximum 30 units uses only blood sugar >2547mdl. Using 8-10 units when sugars are high lately.   pyridOXINE (VITAMIN B-6) 50 MG tablet Take 25 mg by mouth daily. Takes the p5pb6 version 25 mg daily   Sodium Chloride-Sodium Bicarb (NETI POT SINUS  [place]) Place into the nose as needed.   TOUJEO SOLOSTAR 300 UNIT/ML Solostar Pen Inject 23 Units into the skin daily. (Patient taking differently: Inject 15 Units into the skin in the morning and at bedtime. 15 units in the morning and 15 in the evening)   TRIAMCINOLONE ACETONIDE, TOP, 0.05 % OINT Apply topically daily as needed. Uses as needed for rash, bug bites or on gums before dentist.   Zinc 25 MG TABS Take by mouth.   No facility-administered encounter medications on file as of 11/24/2021.    Recent Relevant Labs: Lab Results  Component Value Date/Time   HGBA1C 7.9 (H) 08/23/2021 09:28 AM   HGBA1C 7.3 (H) 04/21/2021 09:12 AM   MICROALBUR 80 12/28/2020 09:49 AM   MICROALBUR 80 01/15/2020 08:28 AM    Kidney Function Lab Results  Component Value Date/Time   CREATININE 0.90 08/23/2021 09:28 AM   CREATININE 0.96 04/21/2021 09:12 AM   GFRNONAA 67 05/17/2020 09:21 AM   GFRAA 78 05/17/2020 09:21 AM     Current antihyperglycemic regimen:  Trulicity 1.5 mg weekly Gets PAP Novolog sliding scale before meals if needed - not using regularly Toujeo 20-23 units twice daily - Pt injecting 12-15 units morning and bedtime Patient verbally confirms she is taking the above medications as directed. Yes  What recent interventions/DTPs have been made to improve glycemic control:  Pt denies any changes   Have there been  any recent hospitalizations or ED visits since last visit with CPP? Yes  Patient denies hypoglycemic symptoms, including None  Patient denies hyperglycemic symptoms, including none  How often are you checking your blood sugar? once daily  What are your blood sugars ranging? 115-120 Fasting: 11/22/21 200  On insulin? Yes How many units:12-15 units morning and bedtime and sliding scale only as needed   During the week, how often does your blood glucose drop below 70? Never  Are you checking your feet daily/regularly? Yes  Adherence Review: Is the patient currently on  a STATIN medication? No Is the patient currently on ACE/ARB medication? No Does the patient have >5 day gap between last estimated fill dates? CPP to review  Care Gaps: Last eye exam / Retinopathy Screening? 07/26/21 Last Annual Wellness Visit? 05/15/21 Last Diabetic Foot Exam? 08/23/21    Star Rating Drugs:  Medication:  Last Fill: Day Supply Trulicity                        Gets PAP    Elray Mcgregor, [place] Pharmacist Assistant  [phone]

## 2021-11-29 NOTE — Telephone Encounter (Signed)
Patient taking Trulicity 1.5mg  every 7 days. On day 6, before she takes her next dose, patient's sugars go up to 200. I was wondering if we could try   Trulicity 1.5mg  Mon+Thursday each week. Patient agreed with this and the schedule was her idea. Will ask PCP

## 2021-11-30 ENCOUNTER — Other Ambulatory Visit: Payer: Self-pay | Admitting: Family Medicine

## 2021-11-30 MED ORDER — TRULICITY 1.5 MG/0.5ML ~~LOC~~ SOAJ: SUBCUTANEOUS | 1 refills | Status: DC

## 2021-11-30 NOTE — Telephone Encounter (Signed)
Called patient to let her know to take Mon+Thursday each week

## 2021-11-30 NOTE — Addendum Note (Signed)
Addended by: Lane Hacker on: 11/30/2021 08:43 AM   Modules accepted: Orders

## 2021-11-30 NOTE — Telephone Encounter (Signed)
She receives through patient assistance.

## 2021-11-30 NOTE — Telephone Encounter (Signed)
Sent. Dr. Persephonie Hegwood  

## 2021-11-30 NOTE — Telephone Encounter (Signed)
New prescription should go to [facility] now, please for Trulicity, can send a e-script there.  Billee Cashing, CMA Clinical Pharmacist Assistant [phone]

## 2021-12-05 ENCOUNTER — Other Ambulatory Visit: Payer: Self-pay | Admitting: Legal Medicine

## 2021-12-05 ENCOUNTER — Ambulatory Visit (INDEPENDENT_AMBULATORY_CARE_PROVIDER_SITE_OTHER): Payer: Medicare Other

## 2021-12-05 DIAGNOSIS — N3001 Acute cystitis with hematuria: Secondary | ICD-10-CM | POA: Diagnosis not present

## 2021-12-05 DIAGNOSIS — R3129 Other microscopic hematuria: Secondary | ICD-10-CM

## 2021-12-05 LAB — POCT URINALYSIS DIP (CLINITEK)
Bilirubin, UA: NEGATIVE
Glucose, UA: NEGATIVE mg/dL
Ketones, POC UA: NEGATIVE mg/dL
Leukocytes, UA: NEGATIVE
Nitrite, UA: NEGATIVE
POC PROTEIN,UA: 30 — AB
Spec Grav, UA: 1.015 (ref 1.010–1.025)
Urobilinogen, UA: 0.2 E.U./dL
pH, UA: 6 (ref 5.0–8.0)

## 2021-12-06 ENCOUNTER — Other Ambulatory Visit: Payer: Self-pay

## 2021-12-07 ENCOUNTER — Telehealth: Payer: Self-pay

## 2021-12-07 NOTE — Telephone Encounter (Signed)
On 11/29/2021, Suzanne Hardin sent a message regarding to Trulicity that patient's blood sugar are high on the 6th day after her weekly shot. She suggested to change her shot to Monday and Thursday ( her shot every 4 days) this was approved and new prescription was sent. Just for clarification, are you agreed with this schedule?

## 2021-12-08 ENCOUNTER — Telehealth: Payer: Self-pay

## 2021-12-08 NOTE — Chronic Care Management (AMB) (Cosign Needed Addendum)
[employer] patient assistance program notification: 11/28/2021  120- day supply of Novofine needles and Novolog FlexPen should arrive to the office in 10-14 business days. Patient has 1  refill remaining and enrollment will expire on 05/24/2022.  The next refill for patient will be fulfilled on 02/11/2022 and 02/14/2022.  Billee Cashing, CMA Clinical Pharmacist Assistant [phone]

## 2021-12-11 ENCOUNTER — Ambulatory Visit
Admission: RE | Admit: 2021-12-11 | Discharge: 2021-12-11 | Disposition: A | Discharge status: Home / Self Care | Payer: Medicare Other | Source: Ambulatory Visit | Attending: Legal Medicine | Admitting: Legal Medicine

## 2021-12-11 DIAGNOSIS — R3129 Other microscopic hematuria: Secondary | ICD-10-CM

## 2021-12-12 NOTE — Progress Notes (Signed)
Kidneys are normal, microscopic hematuria may be coming from bladder- it appears benign lp

## 2021-12-19 ENCOUNTER — Other Ambulatory Visit: Payer: Self-pay

## 2021-12-19 MED ORDER — DULAGLUTIDE 3 MG/0.5ML ~~LOC~~ SOAJ: 3.0000 mg | SUBCUTANEOUS | 2 refills | Status: DC

## 2022-01-03 ENCOUNTER — Ambulatory Visit (INDEPENDENT_AMBULATORY_CARE_PROVIDER_SITE_OTHER): Payer: Medicare Other | Admitting: Legal Medicine

## 2022-01-03 ENCOUNTER — Encounter: Payer: Self-pay | Admitting: Legal Medicine

## 2022-01-03 VITALS — BP 135/70 | HR 82 | Temp 98.2°F | Resp 14 | Ht 66.8 in | Wt 200.0 lb

## 2022-01-03 DIAGNOSIS — G720 Drug-induced myopathy: Secondary | ICD-10-CM | POA: Diagnosis not present

## 2022-01-03 DIAGNOSIS — T466X5A Adverse effect of antihyperlipidemic and antiarteriosclerotic drugs, initial encounter: Secondary | ICD-10-CM

## 2022-01-03 DIAGNOSIS — J4540 Moderate persistent asthma, uncomplicated: Secondary | ICD-10-CM

## 2022-01-03 DIAGNOSIS — R3129 Other microscopic hematuria: Secondary | ICD-10-CM

## 2022-01-03 DIAGNOSIS — Z794 Long term (current) use of insulin: Secondary | ICD-10-CM

## 2022-01-03 DIAGNOSIS — I10 Essential (primary) hypertension: Secondary | ICD-10-CM | POA: Diagnosis not present

## 2022-01-03 DIAGNOSIS — E1169 Type 2 diabetes mellitus with other specified complication: Secondary | ICD-10-CM | POA: Diagnosis not present

## 2022-01-03 DIAGNOSIS — E782 Mixed hyperlipidemia: Secondary | ICD-10-CM

## 2022-01-03 DIAGNOSIS — E559 Vitamin D deficiency, unspecified: Secondary | ICD-10-CM | POA: Diagnosis not present

## 2022-01-03 LAB — POCT URINALYSIS DIP (CLINITEK)
Bilirubin, UA: NEGATIVE
Glucose, UA: NEGATIVE mg/dL
Ketones, POC UA: NEGATIVE mg/dL
Leukocytes, UA: NEGATIVE
Nitrite, UA: NEGATIVE
POC PROTEIN,UA: 100 — AB
Spec Grav, UA: >=1.03 — AB (ref 1.010–1.025)
Urobilinogen, UA: 0.2 E.U./dL
pH, UA: 5 (ref 5.0–8.0)

## 2022-01-03 NOTE — Progress Notes (Signed)
 Subjective:  Patient ID: Suzanne Hardin, female    DOB: 12/29/1955  Age: 65 y.o. MRN: [id number]  Chief Complaint  Patient presents with   Diabetes   Hypertension   Hyperlipidemia    HPI: chronic Patient present with type 2 diabetes. Compliance with treatment has been good; patient take medicines as directed, maintains diet and exercise regimen, follows up as directed, and is keeping glucose diary. Current medicines for diabetes Dulaglutide inject 1.5 mg weekly, Novolog uses sliding scale, maximum 30 units uses only blood sugar >250 mg/dl. Toujeo 20-25 units twice a day. Patient performs foot exams daily and last ophthalmologic exam was 07/26/2021.  Blood sugar is checking daily and average 113-115's.use novolog PRN  Patient presents with hyperlipidemia.  Compliance with treatment has been good; maintains low cholesterol diet, follows up as directed, and maintains exercise regimen.   Microscopic hematuria all life, renal ultrasound normal  Current Outpatient Medications on File Prior to Visit  Medication Sig Dispense Refill   Alpha-Lipoic Acid 200 MG CAPS Take by mouth.     baclofen (LIORESAL) 10 MG tablet Take 10 mg by mouth 3 (three) times daily.     Blood Glucose Calibration (ONETOUCH VERIO) SOLN 1 each by In Vitro route every 30 (thirty) days. 1 each 2   Blood Glucose Monitoring Suppl (ONETOUCH VERIO) w/Device KIT 1 each by Does not apply route 3 (three) times daily. 1 kit 0   Cholecalciferol (VITAMIN D) 50 MCG (2000 UT) tablet Take 2,000 Units by mouth daily.     CONTOUR NEXT TEST test strip USE 1 STRIP TO CHECK GLUCOSE TWICE DAILY 100 each 6   Cyanocobalamin 1000 MCG TBCR Take 1 tablet by mouth daily.     D-Mannose POWD Take by mouth.     Dulaglutide 1.5 MG/0.5ML SOPN Inject 1.5 mg into the skin once a week.     glucose blood (ONETOUCH VERIO) test strip 1 each by Other route 3 (three) times daily. Use as instructed 100 each 12   glucose blood test strip 1 each by Other route 3  (three) times daily as needed. One touch verio     hydrOXYzine (ATARAX) 10 MG/5ML syrup Take by mouth.     Lancets MISC by Does not apply route.     NOVOLOG FLEXPEN 100 UNIT/ML FlexPen Patient uses slidding scale. Maximum 30 units uses only blood sugar >250mg/dl. Using 8-10 units when sugars are high lately. 15 mL 1   pyridOXINE (VITAMIN B-6) 50 MG tablet Take 25 mg by mouth daily. Takes the p5pb6 version 25 mg daily     Sodium Chloride-Sodium Bicarb (NETI POT SINUS WASH NA) Place into the nose as needed.     TOUJEO SOLOSTAR 300 UNIT/ML Solostar Pen Inject 23 Units into the skin daily. (Patient taking differently: Inject 20 Units into the skin in the morning and at bedtime. 20-25 units in the morning and 20-25 in the evening) 6 pen 6   TRIAMCINOLONE ACETONIDE, TOP, 0.05 % OINT Apply topically daily as needed. Uses as needed for rash, bug bites or on gums before dentist.     Zinc 25 MG TABS Take by mouth.     No current facility-administered medications on file prior to visit.   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 Food, (peanuts, bell peppers, raw onions,pickles) Drug, and Skin, environmental, fragrances & air freshners   Type 2 diabetes mellitus with other specified complication ([place]) 2/83/6629   Past Surgical History:  Procedure Laterality Date   APPENDECTOMY  2000   CATARACT EXTRACTION, BILATERAL     CHOLECYSTECTOMY  2000   TONSILLECTOMY AND ADENOIDECTOMY      Family History  Problem Relation Age of Onset   Ulcers Father    Peripheral Artery Disease Father    Breast cancer Maternal Aunt     Breast cancer Maternal Aunt    Breast cancer Cousin    Social History   Socioeconomic History   Marital status: Divorced    Spouse name: Not on file   Number of children: 2   Years of education: Not on file   Highest education level: Not on file  Occupational History   Occupation: Disabled  Tobacco Use   Smoking status: Never   Smokeless tobacco: Never  Vaping Use   Vaping Use: Never used  Substance and Sexual Activity   Alcohol use: Never   Drug use: Never   Sexual activity: Yes    Partners: Male  Other Topics Concern   Not on file  Social History Narrative   Not on file   Social Determinants of Health   Financial Resource Strain: High Risk (05/29/2021)   Overall Financial Resource Strain (CARDIA)    Difficulty of Paying Living Expenses: Hard  Food Insecurity: No Food Insecurity (08/29/2020)   Hunger Vital Sign    Worried About Running Out of Food in the Last Year: Never true    Ran Out of Food in the Last Year: Never true  Transportation Needs: No Transportation Needs (08/29/2020)   PRAPARE - Hydrologist (Medical): No    Lack of Transportation (Non-Medical): No  Physical Activity: Sufficiently Active (08/29/2020)   Exercise Vital Sign    Days of Exercise per Week: 5 days    Minutes of Exercise per Session: 30 min  Stress: Not on file  Social Connections: Not on file    Review of Systems  Constitutional:  Negative for chills, fatigue and fever.  HENT:  Negative for congestion, ear pain and sore throat.   Eyes:  Negative for visual disturbance.  Respiratory:  Negative for cough and shortness of breath.   Cardiovascular:  Negative for chest pain and palpitations.  Gastrointestinal:  Negative for abdominal pain, constipation, diarrhea, nausea and vomiting.  Endocrine: Negative for polydipsia, polyphagia and polyuria.  Genitourinary:  Negative for difficulty urinating and dysuria.  Musculoskeletal:  Negative for arthralgias, back pain  and myalgias.  Skin:  Negative for rash.  Neurological:  Negative for headaches.  Psychiatric/Behavioral:  Negative for dysphoric mood. The patient is not nervous/anxious.      Objective:  BP 135/70   Pulse 82   Temp 98.2 F (36.8 C)   Resp 14   Ht 5' 6.8" (1.697 m)   Wt 200 lb (90.7 kg)   SpO2 99%   BMI 31.51 kg/m      01/03/2022    9:12 AM 11/03/2021    9:52 AM 09/11/2021    2:33 PM  BP/Weight  Systolic BP 476 546 503  Diastolic BP 70 70 80  Wt. (Lbs) 200 202.6 199  BMI 31.51 kg/m2  31.92 kg/m2 31.35 kg/m2    Physical Exam Vitals reviewed.  Constitutional:      General: She is not in acute distress.    Appearance: Normal appearance.  HENT:     Head: Normocephalic.     Right Ear: Tympanic membrane normal.     Left Ear: Tympanic membrane normal.     Nose: Nose normal.     Mouth/Throat:     Mouth: Mucous membranes are moist.     Pharynx: Oropharynx is clear.  Eyes:     Extraocular Movements: Extraocular movements intact.     Conjunctiva/sclera: Conjunctivae normal.     Pupils: Pupils are equal, round, and reactive to light.  Cardiovascular:     Rate and Rhythm: Normal rate and regular rhythm.     Pulses: Normal pulses.     Heart sounds: Normal heart sounds. No murmur heard.    No gallop.  Pulmonary:     Effort: Pulmonary effort is normal. No respiratory distress.     Breath sounds: Normal breath sounds. No wheezing.  Abdominal:     General: Abdomen is flat. Bowel sounds are normal. There is no distension.     Tenderness: There is no abdominal tenderness.  Musculoskeletal:        General: Normal range of motion.     Cervical back: Normal range of motion.     Right lower leg: No edema.     Left lower leg: No edema.  Skin:    General: Skin is warm.     Capillary Refill: Capillary refill takes less than 2 seconds.  Neurological:     General: No focal deficit present.     Mental Status: She is alert and oriented to person, place, and time. Mental status is  at baseline.     Gait: Gait normal.  Psychiatric:        Mood and Affect: Mood normal.        Thought Content: Thought content normal.     Diabetic Foot Exam - Simple   Simple Foot Form Diabetic Foot exam was performed with the following findings: Yes 01/03/2022 10:01 AM  Visual Inspection No deformities, no ulcerations, no other skin breakdown bilaterally: Yes Sensation Testing Intact to touch and monofilament testing bilaterally: Yes Pulse Check Posterior Tibialis and Dorsalis pulse intact bilaterally: Yes Comments      Lab Results  Component Value Date   WBC 8.8 01/03/2022   HGB 14.7 01/03/2022   HCT 42.1 01/03/2022   PLT 307 01/03/2022   GLUCOSE 132 (H) 01/03/2022   CHOL 187 01/03/2022   TRIG 61 01/03/2022   HDL 58 01/03/2022   LDLCALC 118 (H) 01/03/2022   ALT 21 01/03/2022   AST 26 01/03/2022   NA 143 01/03/2022   K 4.2 01/03/2022   CL 103 01/03/2022   CREATININE 0.91 01/03/2022   BUN 11 01/03/2022   CO2 23 01/03/2022   TSH 2.560 12/28/2020   HGBA1C 7.5 (H) 01/03/2022   MICROALBUR 80 12/28/2020      Assessment & Plan:   Problem List Items Addressed This Visit       Cardiovascular and Mediastinum   Essential hypertension - Primary   Relevant Orders   Comprehensive metabolic panel (Completed)   CBC with Differential/Platelet (Completed) An individual hypertension care plan was established and reinforced today.  The patient's status was assessed using clinical findings on exam and labs or diagnostic tests. The patient's success at meeting treatment goals on disease specific evidence-based guidelines and found   to be well controlled. SELF MANAGEMENT: The patient and I together assessed ways to personally work towards obtaining the recommended goals. RECOMMENDATIONS: avoid decongestants found in common cold remedies, decrease consumption of alcohol, perform routine monitoring of BP with home BP cuff, exercise, reduction of dietary salt, take medicines as  prescribed, try not to miss doses and quit smoking.  Regular exercise and maintaining a healthy weight is needed.  Stress reduction may help. A CLINICAL SUMMARY including written plan identify barriers to care unique to individual due to social or financial issues.  We attempt to mutually creat solutions for individual and family understanding.      Respiratory   Moderate persistent asthma Asthma stable     Endocrine   Type 2 diabetes mellitus with other specified complication (HCC)   Relevant Medications   Dulaglutide 1.5 MG/0.5ML SOPN   Other Relevant Orders   Hemoglobin A1c (Completed) An individual care plan for diabetes was established and reinforced today.  The patient's status was assessed using clinical findings on exam, labs and diagnostic testing. Patient success at meeting goals based on disease specific evidence-based guidelines and found to be good controlled. Medications were assessed and patient's understanding of the medical issues , including barriers were assessed. Recommend adherence to a diabetic diet, a graduated exercise program, HgbA1c level is checked quarterly, and urine microalbumin performed yearly .  Annual mono-filament sensation testing performed. Lower blood pressure and control hyperlipidemia is important. Get annual eye exams and annual flu shots and smoking cessation discussed.  Self management goals were discussed.      Musculoskeletal and Integument   Statin myopathy Patient unable to take statins, we discussed injectables     Other   Vitamin D deficiency   Relevant Orders   Vitamin D, 25-hydroxy (Completed) Low vitamin D on supplements   Mixed hyperlipidemia   Relevant Orders   Lipid panel (Completed) AN INDIVIDUAL CARE PLAN for hyperlipidemia/ cholesterol was established and reinforced today.  The patient's status was assessed using clinical findings on exam, lab and other diagnostic tests. The patient's disease status was assessed based on  evidence-based guidelines and found to be fair controlled. MEDICATIONS were reviewed. SELF MANAGEMENT GOALS have been discussed and patient's success at attaining the goal of low cholesterol was assessed. RECOMMENDATION given include regular exercise 3 days a week and low cholesterol/low fat diet. CLINICAL SUMMARY including written plan to identify barriers unique to the patient due to social or economic  reasons was discussed.    Other Visit Diagnoses     Hematuria, microscopic       Relevant Orders   POCT URINALYSIS DIP (CLINITEK) (Completed) Stable essential hematuria     .    Orders Placed This Encounter  Procedures   Comprehensive metabolic panel   Hemoglobin A1c   Lipid panel   CBC with Differential/Platelet   Vitamin D, 25-hydroxy   Cardiovascular Risk Assessment   POCT URINALYSIS DIP (CLINITEK)     Follow-up: Return in about 4 years (around 01/03/2026).  An After Visit Summary was printed and given to the patient.  Reinaldo Meeker, MD [facility] [phone]

## 2022-01-04 LAB — LIPID PANEL
Chol/HDL Ratio: 3.2 ratio (ref 0.0–4.4)
Cholesterol, Total: 187 mg/dL (ref 100–199)
HDL: 58 mg/dL (ref >39)
LDL Chol Calc (NIH): 118 mg/dL — ABNORMAL HIGH (ref 0–99)
Triglycerides: 61 mg/dL (ref 0–149)
VLDL Cholesterol Cal: 11 mg/dL (ref 5–40)

## 2022-01-04 LAB — COMPREHENSIVE METABOLIC PANEL
ALT: 21 IU/L (ref 0–32)
AST: 26 IU/L (ref 0–40)
Albumin/Globulin Ratio: 2.3 — ABNORMAL HIGH (ref 1.2–2.2)
Albumin: 4.5 g/dL (ref 3.9–4.9)
Alkaline Phosphatase: 90 IU/L (ref 44–121)
BUN/Creatinine Ratio: 12 (ref 12–28)
BUN: 11 mg/dL (ref 8–27)
Bilirubin Total: 0.6 mg/dL (ref 0.0–1.2)
CO2: 23 mmol/L (ref 20–29)
Calcium: 9.7 mg/dL (ref 8.7–10.3)
Chloride: 103 mmol/L (ref 96–106)
Creatinine, Ser: 0.91 mg/dL (ref 0.57–1.00)
Globulin, Total: 2 g/dL (ref 1.5–4.5)
Glucose: 132 mg/dL — ABNORMAL HIGH (ref 70–99)
Potassium: 4.2 mmol/L (ref 3.5–5.2)
Sodium: 143 mmol/L (ref 134–144)
Total Protein: 6.5 g/dL (ref 6.0–8.5)
eGFR: 70 mL/min/{1.73_m2} (ref >59)

## 2022-01-04 LAB — HEMOGLOBIN A1C
Est. average glucose Bld gHb Est-mCnc: 169 mg/dL
Hgb A1c MFr Bld: 7.5 % — ABNORMAL HIGH (ref 4.8–5.6)

## 2022-01-04 LAB — CBC WITH DIFFERENTIAL/PLATELET
Basophils Absolute: 0.1 10*3/uL (ref 0.0–0.2)
Basos: 1 %
EOS (ABSOLUTE): 0.3 10*3/uL (ref 0.0–0.4)
Eos: 3 %
Hematocrit: 42.1 % (ref 34.0–46.6)
Hemoglobin: 14.7 g/dL (ref 11.1–15.9)
Immature Grans (Abs): 0 10*3/uL (ref 0.0–0.1)
Immature Granulocytes: 0 %
Lymphocytes Absolute: 3.3 10*3/uL — ABNORMAL HIGH (ref 0.7–3.1)
Lymphs: 37 %
MCH: 30.4 pg (ref 26.6–33.0)
MCHC: 34.9 g/dL (ref 31.5–35.7)
MCV: 87 fL (ref 79–97)
Monocytes Absolute: 0.4 10*3/uL (ref 0.1–0.9)
Monocytes: 4 %
Neutrophils Absolute: 4.8 10*3/uL (ref 1.4–7.0)
Neutrophils: 55 %
Platelets: 307 10*3/uL (ref 150–450)
RBC: 4.83 x10E6/uL (ref 3.77–5.28)
RDW: 12.4 % (ref 11.7–15.4)
WBC: 8.8 10*3/uL (ref 3.4–10.8)

## 2022-01-04 LAB — CARDIOVASCULAR RISK ASSESSMENT

## 2022-01-04 LAB — VITAMIN D 25 HYDROXY (VIT D DEFICIENCY, FRACTURES): Vit D, 25-Hydroxy: 36.8 ng/mL (ref 30.0–100.0)

## 2022-01-04 NOTE — Progress Notes (Signed)
Glucose 132 high, kidney and liver tests normal, A1c 7.5 , LDL cholesterol 118 still high, patient doesn't want cholesterol medicines, CBC normal, vitamin d 36.8 normal,  lp

## 2022-01-24 ENCOUNTER — Telehealth: Payer: Self-pay

## 2022-01-24 NOTE — Progress Notes (Signed)
Chronic Care Management Pharmacy Assistant   Name: CALEDONIA ZOU  MRN: [id number] DOB: [birth year]   Reason for Encounter: Disease State call for DM    Recent office visits:  01/03/22 Reinaldo Meeker MD. Seen for routine visit. Decreased Dulaglutide to 1.23m weekly.   Recent consult visits:  None  Hospital visits:  None  Medications: Outpatient Encounter Medications as of 01/24/2022  Medication Sig   Alpha-Lipoic Acid 200 MG CAPS Take by mouth.   baclofen (LIORESAL) 10 MG tablet Take 10 mg by mouth 3 (three) times daily.   Blood Glucose Calibration (ONETOUCH VERIO) SOLN 1 each by In Vitro route every 30 (thirty) days.   Blood Glucose Monitoring Suppl (ONETOUCH VERIO) w/Device KIT 1 each by Does not apply route 3 (three) times daily.   Cholecalciferol (VITAMIN D) 50 MCG (2000 UT) tablet Take 2,000 Units by mouth daily.   CONTOUR NEXT TEST test strip USE 1 STRIP TO CHECK GLUCOSE TWICE DAILY   Cyanocobalamin 1000 MCG TBCR Take 1 tablet by mouth daily.   D-Mannose POWD Take by mouth.   Dulaglutide 1.5 MG/0.5ML SOPN Inject 1.5 mg into the skin once a week.   glucose blood (ONETOUCH VERIO) test strip 1 each by Other route 3 (three) times daily. Use as instructed   glucose blood test strip 1 each by Other route 3 (three) times daily as needed. One touch verio   hydrOXYzine (ATARAX) 10 MG/5ML syrup Take by mouth.   Lancets MISC by Does not apply route.   NOVOLOG FLEXPEN 100 UNIT/ML FlexPen Patient uses slidding scale. Maximum 30 units uses only blood sugar >2580mdl. Using 8-10 units when sugars are high lately.   pyridOXINE (VITAMIN B-6) 50 MG tablet Take 25 mg by mouth daily. Takes the p5pb6 version 25 mg daily   Sodium Chloride-Sodium Bicarb (NETI POT SINUS [place]A) Place into the nose as needed.   TOUJEO SOLOSTAR 300 UNIT/ML Solostar Pen Inject 23 Units into the skin daily. (Patient taking differently: Inject 20 Units into the skin in the morning and at bedtime. 20-25 units in the  morning and 20-25 in the evening)   TRIAMCINOLONE ACETONIDE, TOP, 0.05 % OINT Apply topically daily as needed. Uses as needed for rash, bug bites or on gums before dentist.   Zinc 25 MG TABS Take by mouth.   No facility-administered encounter medications on file as of 01/24/2022.    Recent Relevant Labs: Lab Results  Component Value Date/Time   HGBA1C 7.5 (H) 01/03/2022 10:33 AM   HGBA1C 7.9 (H) 08/23/2021 09:28 AM   MICROALBUR 80 12/28/2020 09:49 AM   MICROALBUR 80 01/15/2020 08:28 AM    Kidney Function Lab Results  Component Value Date/Time   CREATININE 0.91 01/03/2022 10:33 AM   CREATININE 0.90 08/23/2021 09:28 AM   GFRNONAA 67 05/17/2020 09:21 AM   GFRAA 78 05/17/2020 09:21 AM     Current antihyperglycemic regimen:  Dulaglutide 1.42m40meekly (PAP)  Novolog Flexpen 100units -Maximum 30 units uses only  Toujeo Solostar 300units-Inject 23 Units into the skin daily. Patient verbally confirms she is taking the above medications as directed. Yes  Have there been any recent hospitalizations or ED visits since last visit with CPP? No  Patient denies hypoglycemic symptoms, including None  Patient denies hyperglycemic symptoms, including excessive thirst  How often are you checking your blood sugar? in the morning before eating or drinking  What are your blood sugars ranging? She stated she takes her Trulicity on Wednesdays and her sugars are  good but then a few days later it seems to go up to 130-150s. She stated her metabolism burns off these medications so quickly. She stated on day 6 after her Trucility, her sugars go up.  Fasting: 110 -130  On insulin? Yes How many units:23 units daily and a sliding scale   During the week, how often does your blood glucose drop below 70? Never  Are you checking your feet daily/regularly? Yes  Adherence Review: Is the patient currently on a STATIN medication? No Is the patient currently on ACE/ARB medication? No Does the patient have  >5 day gap between last estimated fill dates? CPP to review  Care Gaps: Last eye exam / Retinopathy Screening? 07/26/21 Last Annual Wellness Visit? 05/15/21 Last Diabetic Foot Exam? 01/03/22   Star Rating Drugs:  Medication:  Last Fill: Day Supply Farxiga (PAP)  Elray Mcgregor, [place] Pharmacist Assistant  [phone]

## 2022-01-30 NOTE — Telephone Encounter (Signed)
Spoke with pt today and she was informed to called [facility] for her refills. Her increased Trulicity dose was faxed in to company already so pt will call them today and call us if she has problems.   Roxana Hires, CMA Clinical Pharmacist Assistant  [phone]

## 2022-01-30 NOTE — Telephone Encounter (Signed)
Provider was under the impression that couldn't do Tulicity 1.5mg  twice a week so he decreased to weekly last month. Sent direct msg to him and he agreed to increase to twice a week.   Also spoke with patient and she told me she forgot she was supposed to increase her Trulicity. She had things going on when we spoke 2 months ago, forgot, and never called for the refill  Called patient after verbal and counseled patient to get refill of twice a week ASAP

## 2022-02-02 ENCOUNTER — Telehealth: Payer: Self-pay

## 2022-02-05 DIAGNOSIS — E1140 Type 2 diabetes mellitus with diabetic neuropathy, unspecified: Secondary | ICD-10-CM | POA: Diagnosis not present

## 2022-02-05 DIAGNOSIS — E7800 Pure hypercholesterolemia, unspecified: Secondary | ICD-10-CM | POA: Diagnosis not present

## 2022-02-05 DIAGNOSIS — Z880 Allergy status to penicillin: Secondary | ICD-10-CM | POA: Diagnosis not present

## 2022-02-05 DIAGNOSIS — G243 Spasmodic torticollis: Secondary | ICD-10-CM | POA: Diagnosis not present

## 2022-02-05 DIAGNOSIS — I10 Essential (primary) hypertension: Secondary | ICD-10-CM | POA: Diagnosis not present

## 2022-02-08 NOTE — Chronic Care Management (AMB) (Signed)
[employer] patient assistance program notification:  120- day supply of Novolog and Novofine needles will be filled on 02/20/2022 and should arrive to the office in 10-14 business days.   Billee Cashing, CMA Clinical Pharmacist Assistant [phone]

## 2022-03-05 ENCOUNTER — Encounter: Payer: Self-pay | Admitting: Physician Assistant

## 2022-03-05 ENCOUNTER — Ambulatory Visit (INDEPENDENT_AMBULATORY_CARE_PROVIDER_SITE_OTHER): Payer: Medicare Other | Admitting: Physician Assistant

## 2022-03-05 VITALS — BP 128/70 | HR 93 | Temp 97.8°F | Ht 66.0 in | Wt 200.8 lb

## 2022-03-05 DIAGNOSIS — N3001 Acute cystitis with hematuria: Secondary | ICD-10-CM | POA: Diagnosis not present

## 2022-03-05 LAB — POCT URINALYSIS DIP (CLINITEK)
Bilirubin, UA: NEGATIVE
Glucose, UA: NEGATIVE mg/dL
Ketones, POC UA: NEGATIVE mg/dL
Leukocytes, UA: NEGATIVE
Nitrite, UA: NEGATIVE
Spec Grav, UA: >=1.03 — AB (ref 1.010–1.025)
Urobilinogen, UA: 0.2 E.U./dL
pH, UA: 5 (ref 5.0–8.0)

## 2022-03-05 MED ORDER — CIPROFLOXACIN HCL 500 MG PO TABS: 500.0000 mg | ORAL_TABLET | Freq: Two times a day (BID) | ORAL | 0 refills | Status: AC

## 2022-03-05 NOTE — Progress Notes (Signed)
Acute Office Visit  Subjective:    Patient ID: Suzanne Hardin, female    DOB: 27-Mar-1956, 66 y.o.   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 Food, (peanuts, bell peppers, raw onions,pickles) Drug, and Skin, environmental, fragrances & air freshners   Type 2 diabetes mellitus with other specified complication ([place]) 7/51/7001    Past Surgical History:  Procedure Laterality Date   APPENDECTOMY  2000   CATARACT EXTRACTION, BILATERAL     CHOLECYSTECTOMY  2000   TONSILLECTOMY AND ADENOIDECTOMY      Family History  Problem Relation Age of Onset   Ulcers Father    Peripheral Artery Disease Father    Breast cancer Maternal Aunt    Breast cancer Maternal Aunt    Breast cancer Cousin     Social History   Socioeconomic History   Marital status: Divorced    Spouse name: Not on file   Number of children: 2   Years of education: Not on file   Highest education level: Not on file  Occupational History   Occupation: Disabled  Tobacco Use   Smoking status: Never   Smokeless tobacco: Never  Vaping Use   Vaping Use: Never used  Substance and Sexual Activity   Alcohol use: Never   Drug use: Never   Sexual activity: Yes    Partners: Male  Other Topics Concern   Not on file  Social History Narrative   Not on file   Social Determinants  of Health   Financial Resource Strain: High Risk (05/29/2021)   Overall Financial Resource Strain (CARDIA)    Difficulty of Paying Living Expenses: Hard  Food Insecurity: No Food Insecurity (08/29/2020)   Hunger Vital Sign    Worried About Running Out of Food in the Last Year: Never true    Ran Out of Food in the Last Year: Never true  Transportation Needs: No Transportation Needs (08/29/2020)   PRAPARE - Hydrologist (Medical): No    Lack of Transportation (Non-Medical): No  Physical Activity: Sufficiently Active (08/29/2020)   Exercise Vital Sign    Days of Exercise per Week: 5 days    Minutes of Exercise per Session: 30 min  Stress: Not on file  Social Connections: Not on file  Intimate Partner Violence: Not on file    Outpatient Medications Prior to Visit  Medication Sig Dispense Refill   Alpha-Lipoic Acid 200 MG CAPS Take by mouth.     baclofen (LIORESAL) 10 MG tablet Take 10 mg by mouth 3 (three) times daily.     Blood Glucose Calibration (ONETOUCH VERIO) SOLN 1 each by In Vitro route every 30 (thirty) days. 1 each 2   Blood Glucose Monitoring Suppl (ONETOUCH VERIO) w/Device KIT 1 each by Does not apply route 3 (three) times daily.  1 kit 0   Cholecalciferol (VITAMIN D) 50 MCG (2000 UT) tablet Take 2,000 Units by mouth daily.     CONTOUR NEXT TEST test strip USE 1 STRIP TO CHECK GLUCOSE TWICE DAILY 100 each 6   Cyanocobalamin 1000 MCG TBCR Take 1 tablet by mouth daily.     D-Mannose POWD Take by mouth.     Dulaglutide 1.5 MG/0.5ML SOPN Inject 1.5 mg into the skin 2 (two) times a week.     glucose blood (ONETOUCH VERIO) test strip 1 each by Other route 3 (three) times daily. Use as instructed 100 each 12   glucose blood test strip 1 each by Other route 3 (three) times daily as needed. One touch verio     hydrOXYzine (ATARAX) 10 MG/5ML syrup Take by mouth.     Lancets MISC by Does not apply route.     NOVOLOG FLEXPEN 100 UNIT/ML FlexPen Patient uses  slidding scale. Maximum 30 units uses only blood sugar >225m/dl. Using 8-10 units when sugars are high lately. 15 mL 1   pyridOXINE (VITAMIN B-6) 50 MG tablet Take 25 mg by mouth daily. Takes the p5pb6 version 25 mg daily     Sodium Chloride-Sodium Bicarb (NETI POT SINUS WMarltonNA) Place into the nose as needed.     TOUJEO SOLOSTAR 300 UNIT/ML Solostar Pen Inject 23 Units into the skin daily. (Patient taking differently: Inject 20 Units into the skin in the morning and at bedtime. 20-25 units in the morning and 20-25 in the evening) 6 pen 6   TRIAMCINOLONE ACETONIDE, TOP, 0.05 % OINT Apply topically daily as needed. Uses as needed for rash, bug bites or on gums before dentist.     Zinc 25 MG TABS Take by mouth.     No facility-administered medications prior to visit.    Allergies  Allergen Reactions   Dextromethorphan-Guaifenesin Itching   Dorzolamide Hcl-Timolol Mal Nausea And Vomiting    Brachycardia, severe headache, swelling Other reaction(s): Other (See Comments) Brachycardia, severe headache, swelling Brachycardia, severe headache, swelling   Hydrocodone Other (See Comments)    Inflammatory Inflammatory Other reaction(s): Other (See Comments) Inflammatory Inflammatory    Lidocaine Swelling   Other Rash, Other (See Comments) and Swelling    Neurological damage fragances fragances  Neurological damage Fragances; YELLOW NUMBER 5;  Yellow #5 Yellow #5 Yellow #5 Yellow #5  High eye pressure High eye pressure High eye pressure    Sodium Laureth Sulfate Dermatitis, Hives, Itching, Other (See Comments), Rash and Swelling   Sulfa Antibiotics Hives   Sulfites Shortness Of Breath    Other reaction(s): Shortness of Breath Other reaction(s): Shortness of Breath    Statins Hives and Other (See Comments)   Articaine-Epinephrine Swelling   Cymbalta [Duloxetine Hcl]    Darvon [Propoxyphene]    Duloxetine    Gentamicin Sulfate    Hctz [Hydrochlorothiazide]    Humalog  [Insulin Lispro]    Keflex [Cephalexin]    Lasix [Furosemide]    Lisinopril-Hydrochlorothiazide    Loratadine    Macrodantin [Nitrofurantoin]    Metformin    Mucinex [Guaifenesin Er]    Peanut-Containing Drug Products    Penicillins    Pioglitazone    Tessalon Perles [Benzonatate]    Travatan [Travoprost]    Tylenol [Acetaminophen]    Propofol Other (See Comments) and Nausea And Vomiting   Sulfamethoxazole-Trimethoprim Rash    Review of Systems CONSTITUTIONAL: Negative for chills, fatigue, fever, CARDIOVASCULAR: Negative for chest pain,  RESPIRATORY: Negative for recent cough and dyspnea.  GASTROINTESTINAL: Negative for abdominal pain\ GU - see HPI         Objective:  PHYSICAL EXAM:   VS: BP 128/70 (BP Location: Left Arm, Patient Position: Sitting, Cuff Size: Large)   Pulse 93   Temp 97.8 F (36.6 C) (Temporal)   Ht '5\' 6"'  (1.676 m)   Wt 200 lb 12.8 oz (91.1 kg)   SpO2 97%   BMI 32.41 kg/m   GEN: Well nourished, well developed, in no acute distress   Cardiac: RRR; no murmurs,  Respiratory:  normal respiratory rate and pattern with no distress - normal breath sounds with no rales, rhonchi, wheezes or rubs Skin: warm and dry, no rash    Health Maintenance Due  Topic Date Due   Hepatitis C Screening  Never done   Zoster Vaccines- Shingrix (1 of 2) Never done    There are no preventive care reminders to display for this patient.   Lab Results  Component Value Date   TSH 2.560 12/28/2020   Lab Results  Component Value Date   WBC 8.8 01/03/2022   HGB 14.7 01/03/2022   HCT 42.1 01/03/2022   MCV 87 01/03/2022   PLT 307 01/03/2022   Lab Results  Component Value Date   NA 143 01/03/2022   K 4.2 01/03/2022   CO2 23 01/03/2022   GLUCOSE 132 (H) 01/03/2022   BUN 11 01/03/2022   CREATININE 0.91 01/03/2022   BILITOT 0.6 01/03/2022   ALKPHOS 90 01/03/2022   AST 26 01/03/2022   ALT 21 01/03/2022   PROT 6.5 01/03/2022   ALBUMIN 4.5 01/03/2022   CALCIUM  9.7 01/03/2022   EGFR 70 01/03/2022   Lab Results  Component Value Date   CHOL 187 01/03/2022   Lab Results  Component Value Date   HDL 58 01/03/2022   Lab Results  Component Value Date   LDLCALC 118 (H) 01/03/2022   Lab Results  Component Value Date   TRIG 61 01/03/2022   Lab Results  Component Value Date   CHOLHDL 3.2 01/03/2022   Lab Results  Component Value Date   HGBA1C 7.5 (H) 01/03/2022       Assessment & Plan:   Problem List Items Addressed This Visit   None Visit Diagnoses     Acute cystitis with hematuria    -  Primary   Relevant Medications   ciprofloxacin (CIPRO) 500 MG tablet   Other Relevant Orders   POCT URINALYSIS DIP (CLINITEK) (Completed)   Urine Culture      Meds ordered this encounter  Medications   ciprofloxacin (CIPRO) 500 MG tablet    Sig: Take 1 tablet (500 mg total) by mouth 2 (two) times daily for 10 days.    Dispense:  20 tablet    Refill:  0    Order Specific Question:   Supervising Provider    AnswerShelton Silvas    Orders Placed This Encounter  Procedures   Urine Culture   POCT URINALYSIS DIP (CLINITEK)     Follow-up: Return in about 2 weeks (around 03/19/2022) for nurse visit - ua check.  An After Visit Summary was printed and given to the patient.  Yetta Flock [facility] [phone]

## 2022-03-07 LAB — URINE CULTURE

## 2022-03-08 ENCOUNTER — Other Ambulatory Visit: Payer: Self-pay | Admitting: Physician Assistant

## 2022-03-08 ENCOUNTER — Other Ambulatory Visit: Payer: Self-pay

## 2022-03-08 MED ORDER — DOXYCYCLINE HYCLATE 100 MG PO TABS: 100.0000 mg | ORAL_TABLET | Freq: Two times a day (BID) | ORAL | 0 refills | Status: DC

## 2022-03-12 ENCOUNTER — Telehealth: Payer: Self-pay

## 2022-03-12 NOTE — Chronic Care Management (AMB) (Signed)
[employer] patient assistance program notification:  120- day supply of Novolog Flexpen and Novofine needles were sent 8/29   & 02/22/2022 and should arrive to the office in 10-14 business days. Patient has 0  refill remaining and enrollment will expire on 05/24/2022.  The next refill for patient will be fulfilled on 05/09/2022 and 05/11/2022. Reorder form filled out for refills until the end of 2023 enrollment year.   Pattricia Boss, [place] Pharmacist Assistant [phone]'

## 2022-03-19 ENCOUNTER — Ambulatory Visit (INDEPENDENT_AMBULATORY_CARE_PROVIDER_SITE_OTHER): Payer: Medicare Other | Admitting: Legal Medicine

## 2022-03-19 DIAGNOSIS — N3001 Acute cystitis with hematuria: Secondary | ICD-10-CM

## 2022-03-19 LAB — POCT URINALYSIS DIP (CLINITEK)
Bilirubin, UA: NEGATIVE
Blood, UA: NEGATIVE
Glucose, UA: NEGATIVE mg/dL
Ketones, POC UA: NEGATIVE mg/dL
Leukocytes, UA: NEGATIVE
Nitrite, UA: NEGATIVE
Spec Grav, UA: 1.015 (ref 1.010–1.025)
Urobilinogen, UA: 0.2 E.U./dL
pH, UA: 6 (ref 5.0–8.0)

## 2022-03-19 NOTE — Progress Notes (Signed)
Negative UA lp

## 2022-03-21 ENCOUNTER — Encounter: Payer: Self-pay | Admitting: Legal Medicine

## 2022-03-22 ENCOUNTER — Telehealth: Payer: Self-pay

## 2022-03-22 NOTE — Chronic Care Management (AMB) (Signed)
Patient Assistance Coordination  Faxed reorder form for Novolog  and Novofine needles to [employer] patient assistance program.  Pattricia Boss, [place] Pharmacist Assistant [phone]

## 2022-03-27 ENCOUNTER — Telehealth: Payer: Self-pay

## 2022-03-27 NOTE — Progress Notes (Signed)
Chronic Care Management Pharmacy Assistant   Name: Suzanne Hardin  MRN: [id number] DOB: [birth year]   Reason for Encounter: Disease State call for DM    Recent office visits:  03/05/22 Marge Duncans PA-C. Seen for UTI. Started on Ciprofloxacin 522m.   Recent consult visits:  02/05/22 (Neurology) BLezlie OctaveMD. Seen for Cervical dystonia. : Botox injections for cervical dystonia. Botulinum toxin A was used for injections today. No med changes.  Hospital visits:  None  Medications: Outpatient Encounter Medications as of 03/27/2022  Medication Sig   Alpha-Lipoic Acid 200 MG CAPS Take by mouth.   baclofen (LIORESAL) 10 MG tablet Take 10 mg by mouth 3 (three) times daily.   Blood Glucose Calibration (ONETOUCH VERIO) SOLN 1 each by In Vitro route every 30 (thirty) days.   Blood Glucose Monitoring Suppl (ONETOUCH VERIO) w/Device KIT 1 each by Does not apply route 3 (three) times daily.   Cholecalciferol (VITAMIN D) 50 MCG (2000 UT) tablet Take 2,000 Units by mouth daily.   CONTOUR NEXT TEST test strip USE 1 STRIP TO CHECK GLUCOSE TWICE DAILY   Cyanocobalamin 1000 MCG TBCR Take 1 tablet by mouth daily.   D-Mannose POWD Take by mouth.   doxycycline (VIBRA-TABS) 100 MG tablet Take 1 tablet (100 mg total) by mouth 2 (two) times daily.   Dulaglutide 1.5 MG/0.5ML SOPN Inject 1.5 mg into the skin 2 (two) times a week.   glucose blood (ONETOUCH VERIO) test strip 1 each by Other route 3 (three) times daily. Use as instructed   glucose blood test strip 1 each by Other route 3 (three) times daily as needed. One touch verio   hydrOXYzine (ATARAX) 10 MG/5ML syrup Take by mouth.   Lancets MISC by Does not apply route.   NOVOLOG FLEXPEN 100 UNIT/ML FlexPen Patient uses slidding scale. Maximum 30 units uses only blood sugar >2533mdl. Using 8-10 units when sugars are high lately.   pyridOXINE (VITAMIN B-6) 50 MG tablet Take 25 mg by mouth daily. Takes the p5pb6 version 25 mg daily   Sodium  Chloride-Sodium Bicarb (NETI POT SINUS WAColonA) Place into the nose as needed.   TOUJEO SOLOSTAR 300 UNIT/ML Solostar Pen Inject 23 Units into the skin daily. (Patient taking differently: Inject 20 Units into the skin in the morning and at bedtime. 20-25 units in the morning and 20-25 in the evening)   TRIAMCINOLONE ACETONIDE, TOP, 0.05 % OINT Apply topically daily as needed. Uses as needed for rash, bug bites or on gums before dentist.   Zinc 25 MG TABS Take by mouth.   No facility-administered encounter medications on file as of 03/27/2022.    Recent Relevant Labs: Lab Results  Component Value Date/Time   HGBA1C 7.5 (H) 01/03/2022 10:33 AM   HGBA1C 7.9 (H) 08/23/2021 09:28 AM   MICROALBUR 80 12/28/2020 09:49 AM   MICROALBUR 80 01/15/2020 08:28 AM    Kidney Function Lab Results  Component Value Date/Time   CREATININE 0.91 01/03/2022 10:33 AM   CREATININE 0.90 08/23/2021 09:28 AM   GFRNONAA 67 05/17/2020 09:21 AM   GFRAA 78 05/17/2020 09:21 AM     Current antihyperglycemic regimen:  Dulaglutide 1.108m99mwice weekly (PAP)  Novolog Flexpen 100units -Maximum 30 units uses only  Toujeo Solostar 300units-Inject 23 Units into the skin daily. Patient verbally confirms she is taking the above medications as directed. Yes  What recent interventions/DTPs have been made to improve glycemic control:  Pt eats healthy and exercise  Have there been  any recent hospitalizations or ED visits since last visit with CPP? No  Patient denies hypoglycemic symptoms, including None  Patient denies hyperglycemic symptoms, including none  How often are you checking your blood sugar? once daily  What are your blood sugars ranging?  Fasting: 110-122  On insulin? Yes How many units:Sliding scale before meals and 23 units daily   During the week, how often does your blood glucose drop below 70? Never  Are you checking your feet daily/regularly? Yes  Adherence Review: Is the patient currently on a  STATIN medication? No Is the patient currently on ACE/ARB medication? No Does the patient have >5 day gap between last estimated fill dates? CPP to review  Care Gaps: Last eye exam / Retinopathy Screening? 07/26/21 Last Annual Wellness Visit? 05/15/21 Last Diabetic Foot Exam? 01/03/22   Star Rating Drugs:  Medication:  Last Fill: Day Supply Farxiga (PAP)  Elray Mcgregor, [place] Pharmacist Assistant  [phone]

## 2022-04-30 ENCOUNTER — Telehealth: Payer: Medicare Other

## 2022-05-02 ENCOUNTER — Other Ambulatory Visit: Payer: Self-pay | Admitting: Legal Medicine

## 2022-05-02 ENCOUNTER — Encounter: Payer: Self-pay | Admitting: Legal Medicine

## 2022-05-02 ENCOUNTER — Ambulatory Visit (INDEPENDENT_AMBULATORY_CARE_PROVIDER_SITE_OTHER): Payer: Medicare Other | Admitting: Legal Medicine

## 2022-05-02 VITALS — BP 112/84 | HR 86 | Temp 97.2°F | Wt 196.8 lb

## 2022-05-02 DIAGNOSIS — J4540 Moderate persistent asthma, uncomplicated: Secondary | ICD-10-CM | POA: Diagnosis not present

## 2022-05-02 DIAGNOSIS — E1169 Type 2 diabetes mellitus with other specified complication: Secondary | ICD-10-CM

## 2022-05-02 DIAGNOSIS — G720 Drug-induced myopathy: Secondary | ICD-10-CM

## 2022-05-02 DIAGNOSIS — E559 Vitamin D deficiency, unspecified: Secondary | ICD-10-CM

## 2022-05-02 DIAGNOSIS — I10 Essential (primary) hypertension: Secondary | ICD-10-CM

## 2022-05-02 DIAGNOSIS — Z794 Long term (current) use of insulin: Secondary | ICD-10-CM

## 2022-05-02 DIAGNOSIS — T466X5A Adverse effect of antihyperlipidemic and antiarteriosclerotic drugs, initial encounter: Secondary | ICD-10-CM

## 2022-05-02 DIAGNOSIS — E538 Deficiency of other specified B group vitamins: Secondary | ICD-10-CM

## 2022-05-02 DIAGNOSIS — E782 Mixed hyperlipidemia: Secondary | ICD-10-CM

## 2022-05-02 MED ORDER — CONTOUR NEXT TEST VI STRP: ORAL_STRIP | 6 refills | Status: DC

## 2022-05-02 NOTE — Progress Notes (Signed)
Subjective:  Patient ID: Suzanne Hardin, female    DOB: 19-Oct-1955  Age: 66 y.o. MRN: [id number]  Chief Complaint  Patient presents with   Diabetes    HPI Diabetes:  Complications: Glucose checking: Glucose logs: Hypoglycemia: Most recent A1C: 7.5 Current medications: Toujeo 20 units in the morning and at bed time, Novolog sliding scale, and Dulaglutide 1.5 mg weekly. Last Eye Exam: Foot checks: trulcity 2 x week, no hypoglycemia  Patient presents for follow up of hypertension.  Patient tolerating diet and exercise well with side effects.  Patient was diagnosed with hypertension 2010 so has been treated for hypertension for 13 years.Patient is working on maintaining diet and exercise regimen and follows up as directed. Complication include none.   Patient presents with hyperlipidemia.  Compliance with treatment has been good; patient takes medicines as directed, maintains low cholesterol diet, follows up as directed, and maintains exercise regimen.  Patient is using diet and exercise without problems.        Current Outpatient Medications on File Prior to Visit  Medication Sig Dispense Refill   Alpha-Lipoic Acid 200 MG CAPS Take by mouth.     baclofen (LIORESAL) 10 MG tablet Take 10 mg by mouth 3 (three) times daily.     Blood Glucose Calibration (ONETOUCH VERIO) SOLN 1 each by In Vitro route every 30 (thirty) days. 1 each 2   Blood Glucose Monitoring Suppl (ONETOUCH VERIO) w/Device KIT 1 each by Does not apply route 3 (three) times daily. 1 kit 0   Cholecalciferol (VITAMIN D) 50 MCG (2000 UT) tablet Take 2,000 Units by mouth daily.     Cyanocobalamin 1000 MCG TBCR Take 1 tablet by mouth daily.     D-Mannose POWD Take by mouth.     Dulaglutide 1.5 MG/0.5ML SOPN Inject 1.5 mg into the skin 2 (two) times a week.     hydrOXYzine (ATARAX) 10 MG/5ML syrup Take by mouth.     Lancets MISC by Does not apply route.     NOVOLOG FLEXPEN 100 UNIT/ML FlexPen Patient uses slidding scale.  Maximum 30 units uses only blood sugar >252m/dl. Using 8-10 units when sugars are high lately. 15 mL 1   pyridOXINE (VITAMIN B-6) 50 MG tablet Take 25 mg by mouth daily. Takes the p5pb6 version 25 mg daily     Sodium Chloride-Sodium Bicarb (NETI POT SINUS [place]NA) Place into the nose as needed.     TOUJEO SOLOSTAR 300 UNIT/ML Solostar Pen Inject 23 Units into the skin daily. (Patient taking differently: Inject 20 Units into the skin in the morning and at bedtime. 20-25 units in the morning and 20-25 in the evening) 6 pen 6   TRIAMCINOLONE ACETONIDE, TOP, 0.05 % OINT Apply topically daily as needed. Uses as needed for rash, bug bites or on gums before dentist.     Zinc 25 MG TABS Take by mouth.     No current facility-administered medications on file prior to visit.   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 Food, (peanuts, bell peppers, raw onions,pickles) Drug, and Skin, environmental, fragrances & air freshners  Type 2 diabetes mellitus with other specified complication ([facility]) 2/72/5366   Past Surgical History:  Procedure Laterality Date   APPENDECTOMY  2000   CATARACT EXTRACTION, BILATERAL     CHOLECYSTECTOMY  2000   TONSILLECTOMY AND ADENOIDECTOMY      Family History  Problem Relation Age of Onset   Ulcers Father    Peripheral Artery Disease Father    Breast cancer Maternal Aunt    Breast cancer Maternal Aunt    Breast cancer Cousin    Social History   Socioeconomic History   Marital status: Divorced    Spouse name: Not on file   Number of children: 2   Years of education: Not on  file   Highest education level: Not on file  Occupational History   Occupation: Disabled  Tobacco Use   Smoking status: Never   Smokeless tobacco: Never  Vaping Use   Vaping Use: Never used  Substance and Sexual Activity   Alcohol use: Never   Drug use: Never   Sexual activity: Yes    Partners: Male  Other Topics Concern   Not on file  Social History Narrative   Not on file   Social Determinants of Health   Financial Resource Strain: High Risk (05/03/2022)   Overall Financial Resource Strain (CARDIA)    Difficulty of Paying Living Expenses: Hard  Food Insecurity: No Food Insecurity (08/29/2020)   Hunger Vital Sign    Worried About Running Out of Food in the Last Year: Never true    Ran Out of Food in the Last Year: Never true  Transportation Needs: No Transportation Needs (05/03/2022)   PRAPARE - Hydrologist (Medical): No    Lack of Transportation (Non-Medical): No  Physical Activity: Sufficiently Active (08/29/2020)   Exercise Vital Sign    Days of Exercise per Week: 5 days    Minutes of Exercise per Session: 30 min  Stress: Not on file  Social Connections: Not on file    Review of Systems  Constitutional:  Negative for activity change, appetite change and fatigue.  HENT:  Negative for congestion, ear pain and sore throat.   Eyes:  Negative for visual disturbance.  Respiratory:  Negative for cough and shortness of breath.   Cardiovascular:  Negative for chest pain.  Gastrointestinal:  Negative for abdominal pain, constipation, diarrhea, nausea and vomiting.  Genitourinary:  Negative for dysuria, frequency and urgency.  Musculoskeletal:  Negative for arthralgias, back pain and myalgias.  Skin: Negative.   Neurological:  Negative for dizziness and headaches.  Psychiatric/Behavioral:  Negative for agitation and sleep disturbance. The patient is not nervous/anxious.      Objective:  BP 112/84 (BP Location: Left Arm, Patient Position:  Sitting, Cuff Size: Large)   Pulse 86   Temp (!) 97.2 F (36.2 C) (Temporal)   Wt 196 lb 12.8 oz (89.3 kg)   SpO2 97%   BMI 31.76 kg/m      05/02/2022    8:02 AM 03/05/2022    8:55 AM 01/03/2022    9:12 AM  BP/Weight  Systolic BP 440 347 425  Diastolic BP 84 70 70  Wt. (Lbs) 196.8 200.8 200  BMI 31.76 kg/m2 32.41 kg/m2 31.51 kg/m2    Physical Exam Vitals reviewed.  Constitutional:      General: She is not in acute distress.    Appearance: Normal appearance.  HENT:     Head: Normocephalic.     Right Ear: Tympanic membrane normal.  Left Ear: Tympanic membrane normal.     Nose: Nose normal.     Mouth/Throat:     Mouth: Mucous membranes are moist.     Pharynx: Oropharynx is clear.  Eyes:     Conjunctiva/sclera: Conjunctivae normal.     Pupils: Pupils are equal, round, and reactive to light.  Cardiovascular:     Rate and Rhythm: Normal rate and regular rhythm.     Pulses: Normal pulses.     Heart sounds: Normal heart sounds. No murmur heard.    No gallop.  Pulmonary:     Effort: Pulmonary effort is normal. No respiratory distress.     Breath sounds: Normal breath sounds. No wheezing.  Abdominal:     General: Abdomen is flat. Bowel sounds are normal. There is no distension.     Tenderness: There is no abdominal tenderness.  Musculoskeletal:        General: Normal range of motion.     Cervical back: Normal range of motion.  Skin:    General: Skin is warm.     Capillary Refill: Capillary refill takes less than 2 seconds.  Neurological:     General: No focal deficit present.     Mental Status: She is alert. Mental status is at baseline.  Psychiatric:        Mood and Affect: Mood normal.        Behavior: Behavior normal.     Diabetic Foot Exam - Simple   Simple Foot Form Diabetic Foot exam was performed with the following findings: Yes 05/02/2022  8:23 AM  Visual Inspection No deformities, no ulcerations, no other skin breakdown bilaterally: Yes Sensation  Testing Intact to touch and monofilament testing bilaterally: Yes Pulse Check Posterior Tibialis and Dorsalis pulse intact bilaterally: Yes Comments      Lab Results  Component Value Date   WBC 7.7 05/02/2022   HGB 14.8 05/02/2022   HCT 43.8 05/02/2022   PLT 288 05/02/2022   GLUCOSE 125 (H) 05/02/2022   CHOL 191 05/02/2022   TRIG 72 05/02/2022   HDL 55 05/02/2022   LDLCALC 123 (H) 05/02/2022   ALT 25 05/02/2022   AST 29 05/02/2022   NA 141 05/02/2022   K 3.9 05/02/2022   CL 103 05/02/2022   CREATININE 0.94 05/02/2022   BUN 10 05/02/2022   CO2 25 05/02/2022   TSH 2.560 12/28/2020   HGBA1C 7.3 (H) 05/02/2022   MICROALBUR 80 12/28/2020      Assessment & Plan:   Problem List Items Addressed This Visit       Cardiovascular and Mediastinum   Essential hypertension - Primary   Relevant Orders   Comprehensive metabolic panel (Completed)   CBC with Differential/Platelet (Completed) An individual hypertension care plan was established and reinforced today.  The patient's status was assessed using clinical findings on exam and labs or diagnostic tests. The patient's success at meeting treatment goals on disease specific evidence-based guidelines and found to be well controlled. SELF MANAGEMENT: The patient and I together assessed ways to personally work towards obtaining the recommended goals. RECOMMENDATIONS: avoid decongestants found in common cold remedies, decrease consumption of alcohol, perform routine monitoring of BP with home BP cuff, exercise, reduction of dietary salt, take medicines as prescribed, try not to miss doses and quit smoking.  Regular exercise and maintaining a healthy weight is needed.  Stress reduction may help. A CLINICAL SUMMARY including written plan identify barriers to care unique to individual due to social or financial issues.  We attempt to mutually creat solutions for individual and family understanding.      Respiratory   Moderate persistent  asthma Asthma stable on medicines     Endocrine   Type 2 diabetes mellitus with other specified complication (HCC)   Relevant Orders   Hemoglobin A1c (Completed) An individual care plan for diabetes was established and reinforced today.  The patient's status was assessed using clinical findings on exam, labs and diagnostic testing. Patient success at meeting goals based on disease specific evidence-based guidelines and found to be good controlled. Medications were assessed and patient's understanding of the medical issues , including barriers were assessed. Recommend adherence to a diabetic diet, a graduated exercise program, HgbA1c level is checked quarterly, and urine microalbumin performed yearly .  Annual mono-filament sensation testing performed. Lower blood pressure and control hyperlipidemia is important. Get annual eye exams and annual flu shots and smoking cessation discussed.  Self management goals were discussed.      Musculoskeletal and Integument   Statin myopathy Unable to use statin     Other   Vitamin D deficiency   Relevant Orders   VITAMIN D 25 Hydroxy (Vit-D Deficiency, Fractures) (Completed) Patient has vitamin D deficiency and needs recheck   Mixed hyperlipidemia   Relevant Orders   Lipid panel (Completed) AN INDIVIDUAL CARE PLAN for hyperlipidemia/ cholesterol was established and reinforced today.  The patient's status was assessed using clinical findings on exam, lab and other diagnostic tests. The patient's disease status was assessed based on evidence-based guidelines and found to be fair controlled. MEDICATIONS were reviewed. SELF MANAGEMENT GOALS have been discussed and patient's success at attaining the goal of low cholesterol was assessed. RECOMMENDATION given include regular exercise 3 days a week and low cholesterol/low fat diet. CLINICAL SUMMARY including written plan to identify barriers unique to the patient due to social or economic  reasons was  discussed.    Other Visit Diagnoses     Deficiency of vitamin B12       Relevant Orders   Vitamin B12 (Completed) Low vitamin B12, recheck level     .       Follow-up: Return in about 3 months (around 08/02/2022), or [facility].  An After Visit Summary was printed and given to the patient.  Reinaldo Meeker, MD [facility] [phone]

## 2022-05-03 ENCOUNTER — Ambulatory Visit (INDEPENDENT_AMBULATORY_CARE_PROVIDER_SITE_OTHER): Payer: Medicare Other

## 2022-05-03 LAB — LIPID PANEL
Chol/HDL Ratio: 3.5 ratio (ref 0.0–4.4)
Cholesterol, Total: 191 mg/dL (ref 100–199)
HDL: 55 mg/dL (ref >39)
LDL Chol Calc (NIH): 123 mg/dL — ABNORMAL HIGH (ref 0–99)
Triglycerides: 72 mg/dL (ref 0–149)
VLDL Cholesterol Cal: 13 mg/dL (ref 5–40)

## 2022-05-03 LAB — COMPREHENSIVE METABOLIC PANEL
ALT: 25 IU/L (ref 0–32)
AST: 29 IU/L (ref 0–40)
Albumin/Globulin Ratio: 2.2 (ref 1.2–2.2)
Albumin: 4.4 g/dL (ref 3.9–4.9)
Alkaline Phosphatase: 91 IU/L (ref 44–121)
BUN/Creatinine Ratio: 11 — ABNORMAL LOW (ref 12–28)
BUN: 10 mg/dL (ref 8–27)
Bilirubin Total: 0.8 mg/dL (ref 0.0–1.2)
CO2: 25 mmol/L (ref 20–29)
Calcium: 9.4 mg/dL (ref 8.7–10.3)
Chloride: 103 mmol/L (ref 96–106)
Creatinine, Ser: 0.94 mg/dL (ref 0.57–1.00)
Globulin, Total: 2 g/dL (ref 1.5–4.5)
Glucose: 125 mg/dL — ABNORMAL HIGH (ref 70–99)
Potassium: 3.9 mmol/L (ref 3.5–5.2)
Sodium: 141 mmol/L (ref 134–144)
Total Protein: 6.4 g/dL (ref 6.0–8.5)
eGFR: 67 mL/min/{1.73_m2} (ref >59)

## 2022-05-03 LAB — CBC WITH DIFFERENTIAL/PLATELET
Basophils Absolute: 0.1 10*3/uL (ref 0.0–0.2)
Basos: 1 %
EOS (ABSOLUTE): 0.3 10*3/uL (ref 0.0–0.4)
Eos: 4 %
Hematocrit: 43.8 % (ref 34.0–46.6)
Hemoglobin: 14.8 g/dL (ref 11.1–15.9)
Immature Grans (Abs): 0 10*3/uL (ref 0.0–0.1)
Immature Granulocytes: 0 %
Lymphocytes Absolute: 3 10*3/uL (ref 0.7–3.1)
Lymphs: 39 %
MCH: 29.9 pg (ref 26.6–33.0)
MCHC: 33.8 g/dL (ref 31.5–35.7)
MCV: 89 fL (ref 79–97)
Monocytes Absolute: 0.3 10*3/uL (ref 0.1–0.9)
Monocytes: 4 %
Neutrophils Absolute: 4 10*3/uL (ref 1.4–7.0)
Neutrophils: 52 %
Platelets: 288 10*3/uL (ref 150–450)
RBC: 4.95 x10E6/uL (ref 3.77–5.28)
RDW: 12.3 % (ref 11.7–15.4)
WBC: 7.7 10*3/uL (ref 3.4–10.8)

## 2022-05-03 LAB — VITAMIN D 25 HYDROXY (VIT D DEFICIENCY, FRACTURES): Vit D, 25-Hydroxy: 37.8 ng/mL (ref 30.0–100.0)

## 2022-05-03 LAB — CARDIOVASCULAR RISK ASSESSMENT

## 2022-05-03 LAB — VITAMIN B12: Vitamin B-12: 1130 pg/mL (ref 232–1245)

## 2022-05-03 LAB — HEMOGLOBIN A1C
Est. average glucose Bld gHb Est-mCnc: 163 mg/dL
Hgb A1c MFr Bld: 7.3 % — ABNORMAL HIGH (ref 4.8–5.6)

## 2022-05-03 NOTE — Progress Notes (Signed)
Glucose 125, kidney tests normal, Liver tests normal, A1c 7.3 OK, LDL cholesterol 123 high, She has severe reactions to medicine, Vitamin D 36.8 normal, B12 1,130 high, CBC normal lp

## 2022-05-03 NOTE — Progress Notes (Signed)
Chronic Care Management Pharmacy Note  05/03/2022 Name:  Suzanne [facility] MRN:  [id number] DOB:  10/25/1955  Summary: Pleasant 66 year old female presents for f/u CCM visit. She worked in [employer], commissioning. She also worked as a Special educational needs teacher. She became disabled by the flu vaccine, she is allergic to an excipient in most injections. 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 She sued Workers comp, was the first person to do so, and won. Because of this she does not get vaccines as they can cause her immense harm. She self-identifies as an extrovert   Plan Updates:  Due for AWV Renew PAP's for 2024  Subjective: Suzanne Hardin is an 66 y.o. year old female who is a primary patient of Henrene Pastor, Zeb Comfort, MD.  The CCM team was consulted for assistance with disease management and care coordination needs.    Engaged with patient by telephone for follow up visit in response to provider referral for pharmacy case management and/or care coordination services.   Consent to Services:  The patient was given information about Chronic Care Management services, agreed to services, and gave verbal consent prior to initiation of services.  Please see initial visit note for detailed documentation.   Patient Care Team: Lillard Anes, MD as PCP - General (Family Medicine) Lane Hacker, [facility] (Pharmacist)  Recent office visits:  03/05/22 Marge Duncans PA-C. Seen for UTI. Started on Ciprofloxacin 542m.    Recent consult visits:  02/05/22 (Neurology) BLezlie OctaveMD. Seen for Cervical dystonia. : Botox injections for cervical dystonia. Botulinum toxin A was used for injections today. No med changes.   Hospital visits:  None  Objective:  Lab Results  Component Value Date   CREATININE 0.94 05/02/2022   BUN 10 05/02/2022   GFRNONAA 67 05/17/2020   GFRAA 78 05/17/2020   NA 141 05/02/2022   K 3.9 05/02/2022   CALCIUM 9.4  05/02/2022   CO2 25 05/02/2022    Lab Results  Component Value Date/Time   HGBA1C 7.3 (H) 05/02/2022 08:57 AM   HGBA1C 7.5 (H) 01/03/2022 10:33 AM   MICROALBUR 80 12/28/2020 09:49 AM   MICROALBUR 80 01/15/2020 08:28 AM    Last diabetic Eye exam:  Lab Results  Component Value Date/Time   HMDIABEYEEXA No Retinopathy 07/26/2021 12:00 AM    Last diabetic Foot exam: No results found for: "HMDIABFOOTEX"   Lab Results  Component Value Date   CHOL 191 05/02/2022   HDL 55 05/02/2022   LDLCALC 123 (H) 05/02/2022   TRIG 72 05/02/2022   CHOLHDL 3.5 05/02/2022       Latest Ref Rng & Units 05/02/2022    8:57 AM 01/03/2022   10:33 AM 08/23/2021    9:28 AM  Hepatic Function  Total Protein 6.0 - 8.5 g/dL 6.4  6.5  6.3   Albumin 3.9 - 4.9 g/dL 4.4  4.5  4.4   AST 0 - 40 IU/L _0 ALT 0 - 32 IU/L _1 Alk Phosphatase 44 - 121 IU/L 91  90  91   Total Bilirubin 0.0 - 1.2 mg/dL 0.8  0.6  0.6     Lab Results  Component Value Date/Time   TSH 2.560 12/28/2020 10:15 AM   TSH 1.760 01/15/2020 09:07 AM       Latest Ref Rng & Units 05/02/2022    8:57  AM 01/03/2022   10:33 AM 08/23/2021    9:28 AM  CBC  WBC 3.4 - 10.8 x10E3/uL 7.7  8.8  8.5   Hemoglobin 11.1 - 15.9 g/dL 14.8  14.7  14.5   Hematocrit 34.0 - 46.6 % 43.8  42.1  43.3   Platelets 150 - 450 x10E3/uL 288  307  271     Lab Results  Component Value Date/Time   VD25OH 37.8 05/02/2022 08:57 AM   VD25OH 36.8 01/03/2022 10:33 AM    Clinical ASCVD: No  The 10-year ASCVD risk score (Arnett DK, et al., 2019) is: 8%   Values used to calculate the score:     Age: 66 years     Sex: Female     Is Non-Hispanic African American: No     Diabetic: Yes     Tobacco smoker: No     Systolic Blood Pressure: 449 mmHg     Is BP treated: No     HDL Cholesterol: 55 mg/dL     Total Cholesterol: 191 mg/dL       05/15/2021   11:24 AM 02/10/2021    9:36 AM 01/15/2020    8:18 AM  Depression screen PHQ 2/9  Decreased Interest 0  0 0  Down, Depressed, Hopeless 0 0 0  PHQ - 2 Score 0 0 0     Social History   Tobacco Use  Smoking Status Never  Smokeless Tobacco Never   BP Readings from Last 3 Encounters:  05/02/22 112/84  03/05/22 128/70  01/03/22 135/70   Pulse Readings from Last 3 Encounters:  05/02/22 86  03/05/22 93  01/03/22 82   Wt Readings from Last 3 Encounters:  05/02/22 196 lb 12.8 oz (89.3 kg)  03/05/22 200 lb 12.8 oz (91.1 kg)  01/03/22 200 lb (90.7 kg)    Assessment/Interventions: Review of patient past medical history, allergies, medications, health status, including review of consultants reports, laboratory and other test data, was performed as part of comprehensive evaluation and provision of chronic care management services.   SDOH:  (Social Determinants of Health) assessments and interventions performed: Yes SDOH Interventions    Flowsheet Row Chronic Care Management from 05/03/2022 in [place] Management from 05/29/2021 in [place]  SDOH Interventions    Transportation Interventions Intervention Not Indicated --  Financial Strain Interventions Other (Comment)  [Trulicity/Toujeo PAP] Other (Comment)  [PAP]       CCM Care Plan  Allergies  Allergen Reactions   Dextromethorphan-Guaifenesin Itching   Dorzolamide Hcl-Timolol Mal Nausea And Vomiting    Brachycardia, severe headache, swelling Other reaction(s): Other (See Comments) Brachycardia, severe headache, swelling Brachycardia, severe headache, swelling   Hydrocodone Other (See Comments)    Inflammatory Inflammatory Other reaction(s): Other (See Comments) Inflammatory Inflammatory    Lidocaine Swelling   Other Rash, Other (See Comments) and Swelling    Neurological damage fragances fragances  Neurological damage Fragances; YELLOW NUMBER 5;  Yellow #5 Yellow #5 Yellow #5 Yellow #5  High eye pressure High eye pressure High eye pressure    Sodium Laureth Sulfate Dermatitis,  Hives, Itching, Other (See Comments), Rash and Swelling   Sulfa Antibiotics Hives   Sulfites Shortness Of Breath    Other reaction(s): Shortness of Breath Other reaction(s): Shortness of Breath    Statins Hives and Other (See Comments)   Articaine-Epinephrine Swelling   Cymbalta [Duloxetine Hcl]    Darvon [Propoxyphene]    Duloxetine    Gentamicin Sulfate    Hctz [  Hydrochlorothiazide]    Humalog [Insulin Lispro]    Keflex [Cephalexin]    Lasix [Furosemide]    Lisinopril-Hydrochlorothiazide    Loratadine    Macrodantin [Nitrofurantoin]    Metformin    Mucinex [Guaifenesin Er]    Peanut-Containing Drug Products    Penicillins    Pioglitazone    Tessalon Perles [Benzonatate]    Travatan [Travoprost]    Tylenol [Acetaminophen]    Propofol Other (See Comments) and Nausea And Vomiting   Sulfamethoxazole-Trimethoprim Rash    Medications Reviewed Today     Reviewed by Lane Hacker, [facility] (Pharmacist) on 05/03/22 at 1041  Med List Status: <None>   Medication Order Taking? Sig Documenting Provider Last Dose Status Informant  Alpha-Lipoic Acid 200 MG CAPS [id number] No Take by mouth. [provider] Taking Active   baclofen (LIORESAL) 10 MG tablet [id number] No Take 10 mg by mouth 3 (three) times daily. [provider] Taking Active   Blood Glucose Calibration (ONETOUCH VERIO) SOLN [id number] No 1 each by In Vitro route every 30 (thirty) days. Lillard Anes, MD Taking Active   Blood Glucose Monitoring Suppl [facility] VERIO) w/Device Drucie Opitz [id number] No 1 each by Does not apply route 3 (three) times daily. Lillard Anes, MD Taking Active   Cholecalciferol (VITAMIN D) 50 MCG (2000 UT) tablet [id number] No Take 2,000 Units by mouth daily. [provider] Taking Active   CONTOUR NEXT TEST test strip [id number]  USE AS INSTRUCTED Lillard Anes, MD  Active   Cyanocobalamin 1000 MCG TBCR [id number] No Take 1 tablet by mouth daily. [provider] Taking Active   D-Mannose POWD [id number] No Take by mouth. [provider] Taking Active   Dulaglutide 1.5 MG/0.5ML SOPN [id number] No Inject 1.5 mg into the skin 2 (two) times a week. [provider] Taking Active   hydrOXYzine (ATARAX) 10 MG/5ML syrup [id number] No Take by mouth. [provider] Taking Active   Lancets [facility] [id number] No by Does not apply route. [provider] Taking Active   NOVOLOG FLEXPEN 100 UNIT/ML FlexPen [id number] No Patient uses slidding scale. Maximum 30 units uses only blood sugar >218m/dl. Using 8-10 units when sugars are high lately. PLillard Anes MD Taking Active   pyridOXINE (VITAMIN B-6) 50 MG tablet [id number] Take 25 mg by mouth daily. Takes the p5pb6 version 25 mg daily [provider] Taking Active   Sodium Chloride-Sodium Bicarb (NETI POT SINUS WRobertsNA) [id number] Place into the nose as needed. [provider] Taking Active   TOUJEO SOLOSTAR 300 UNIT/ML Solostar Pen [id number] Inject 23 Units into the skin daily.  Patient taking differently: Inject 20 Units into the skin in the morning and at bedtime. 20-25 units in the morning and 20-25 in the evening   PLillard Anes MD Taking Active   TRIAMCINOLONE ACETONIDE, TOP, 0.05 % OINT [id number] Apply topically daily as needed. Uses as needed for rash, bug bites or on gums before dentist. [provider] Taking Active   Zinc 25 MG TABS [id number] Take by mouth. 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   Essential hypertension 09/16/2019   Type 2 diabetes mellitus with other specified complication (HClarion 067/67/2094  Moderate persistent asthma 09/16/2019   Vitamin D deficiency 10/16/2012  Immunization History  Administered Date(s) Administered    Influenza-Unspecified 04/09/2013    Conditions to be addressed/monitored:  Hypertension, Hyperlipidemia, Diabetes and Asthma  Care Plan : [place]  Updates made by Lane Hacker, RPH since 05/03/2022 12:00 AM     Problem: hld, dm, htn   Priority: High  Onset Date: 08/29/2020     Long-Range Goal: Disease Management   Start Date: 08/29/2020  Expected End Date: 08/29/2021  Recent Progress: On track  Priority: High  Note:   Pharmacist Clinical Goal(s):  Over the next 90 days, patient will verbalize ability to afford treatment regimen achieve control of diabetes as evidenced by a1c and blood glucose through collaboration with PharmD and provider.   Interventions: 1:1 collaboration with Lillard Anes, MD regarding development and update of comprehensive plan of care as evidenced by provider attestation and co-signature Inter-disciplinary care team collaboration (see longitudinal plan of care) Comprehensive medication review performed; medication list updated in electronic medical record  Hypertension (BP goal <130/80) -Controlled -Current treatment: Diet and lifestyle -Medications previously tried: lisinopril, hydrochlorothiazide  -Current home readings: 115/60 -Current dietary habits: reports increased blood sugar readings from deviating from normal diet -Current exercise habits: pool exercise therapy daily -Denies hypotensive/hypertensive symptoms -Educated on BP goals and benefits of medications for prevention of heart attack, stroke and kidney damage; Daily salt intake goal < 2300 mg; Exercise goal of 150 minutes per week; Importance of home blood pressure monitoring; Symptoms of hypotension and importance of maintaining adequate hydration; -Counseled to monitor BP at home monthly, document, and provide log at future appointments -Counseled on diet and exercise extensively  Hyperlipidemia: (LDL goal < 70) -Not ideally controlled -Current  treatment: Diet and lifestyle  -Medications previously tried: none reported -Current dietary patterns: has varied from her normal a little more than normal but getting back on track.  -Current exercise habits: daily pool rehab exercise.  -Educated on Cholesterol goals;  Importance of limiting foods high in cholesterol; Exercise goal of 150 minutes per week; -Counseled on diet and exercise extensively  Diabetes (A1c goal <8%) Lab Results  Component Value Date   HGBA1C 7.3 (H) 04/21/2021   HGBA1C 7.4 (H) 12/28/2020   HGBA1C 7.7 (H) 08/26/2020   Lab Results  Component Value Date   MICROALBUR 80 12/28/2020   LDLCALC 122 (H) 04/21/2021   CREATININE 0.96 04/21/2021   Lab Results  Component Value Date   NA 144 04/21/2021   K 4.1 04/21/2021   CREATININE 0.96 04/21/2021   EGFR 66 04/21/2021   GFRNONAA 67 05/17/2020   GLUCOSE 134 (H) 04/21/2021   Lab Results  Component Value Date   WBC 7.7 04/21/2021   HGB 14.6 04/21/2021   HCT 42.4 04/21/2021   MCV 88 04/21/2021   PLT 311 04/21/2021  -Controlled -Current medications: Trulicity 7.6PP twice a week Appropriate, Effective, Safe, Accessible PAP Approved 2023 Novolog sliding scale before meals if needed - not using regularly Appropriate, Effective, Safe, Accessible Toujeo 20-23 units twice daily Appropriate, Effective, Safe, Accessible PAP Approved 2023 -Medications previously tried: metformin, Victoza, Humalog (Allergic to an expedient in most injectables) -Current home glucose readings fasting glucose:  July 2022: 100-160 mg/dL December 2022: Patient didn't want to read values, just told me they were in the 300's this weekend but that's because they went to a christmas party and she had cookies. Stated they aren't normally that high -Denies hypoglycemic/hyperglycemic symptoms -Current meal patterns:  Diet: states that she knows variations and her diet is why  her a1c isn't better than it is  drinks: milk, water, sage tea,  ginger tea -Current exercise: daily pool exercise for rehab -Educated onA1c and blood sugar goals; Complications of diabetes including kidney damage, retinal damage, and cardiovascular disease; Exercise goal of 150 minutes per week; Benefits of weight loss; Benefits of routine self-monitoring of blood sugar; Carbohydrate counting and/or plate method -Counseled to check feet daily and get yearly eye exams -Counseled on diet and exercise extensively December 2022: Patient told me, "I was told that the Trulicity should be increased, but I'm afraid of going below 90. If I go below 90, I start to shake and have horrible tremors." -Because patient has a bad Hx with Hypoglycemia, best goal is A1c<8 (Especially since once she turns 66 in a year, that will be her goal). Because of that, recommend maintaining status quo. If further treatment escalation needed, Trulicity would be next option and I explained how that has less Hypo than Toujeo. Patient affirmed understanding November 2023: Patient having no lows. Congratulated on great effort!  Chronic UTI (Goal: Prevent UTI's) UTI's 03/05/22 12/05/21 11/03/21 09/11/21 -Uncontrolled -Current treatment  D-Mannose Powder Appropriate, Effective, Safe, Accessible -Medications previously tried: Nitrofurantoin (Allergies), Cephalexin (Allergies), Bactrim (Allergies) November 2023: Wants to to stick with D-Mannose, doesn't want to try preventative therapy yet. States Doxy and Cipro are only things that work. Spoke about Fosfomycin as a potential. Would like to stick with D-Mannose. States she had stopped taking it over the summer but once she got back on it, kidneys felt better   Patient Goals/Self-Care Activities Over the next 90 days, patient will:  - take medications as prescribed focus on medication adherence by using pill box check glucose 3-4 times daily , document, and provide at future appointments target a minimum of 150 minutes of moderate  intensity exercise weekly Reduce carbohydrate intake and work to improve diet since COVID and stomach symptoms.   Follow Up Plan: Telephone follow up appointment with care management team member scheduled for: April 2024  [place] Constable, [place].D. - [phone]        Medication Assistance:  Trulicity and Toujeo obtained through [employer] Patient assistance and Novonordisk medication assistance program.  Enrollment ends 06/24/2022   Adherence Review: Is the patient currently on a STATIN medication? No Is the patient currently on ACE/ARB medication? No Does the patient have >5 day gap between last estimated fill dates? CPP to review   Care Gaps: Last eye exam / Retinopathy Screening? 07/26/21 Last Annual Wellness Visit? 05/15/21 Last Diabetic Foot Exam? 01/03/22  Patient's preferred pharmacy is:  [facility] [place], [place] - [address] [place] [address] Phone: [phone] Fax: [phone]  [id number] [id number]: [phone] Fax: [phone]  Uses pill box? Yes Pt endorses good compliance  We discussed: Current pharmacy is preferred with insurance plan and patient is satisfied with pharmacy services Patient decided to: Continue current medication management strategy  Care Plan and Follow Up Patient Decision:  Patient agrees to Care Plan and Follow-up.  Plan: Telephone follow up appointment with care management team member scheduled for:  April 2024  NArizona Constable PFloridaD. -- [phone]

## 2022-05-03 NOTE — Patient Instructions (Signed)
Visit Information   Goals Addressed   None    Patient Care Plan: CCM Pharmacy Care Plan     Problem Identified: hld, dm, htn   Priority: High  Onset Date: 08/29/2020     Long-Range Goal: Disease Management   Start Date: 08/29/2020  Expected End Date: 08/29/2021  Recent Progress: On track  Priority: High  Note:   Pharmacist Clinical Goal(s):  Over the next 90 days, patient will verbalize ability to afford treatment regimen achieve control of diabetes as evidenced by a1c and blood glucose through collaboration with PharmD and provider.   Interventions: 1:1 collaboration with Lillard Anes, MD regarding development and update of comprehensive plan of care as evidenced by provider attestation and co-signature Inter-disciplinary care team collaboration (see longitudinal plan of care) Comprehensive medication review performed; medication list updated in electronic medical record  Hypertension (BP goal <130/80) -Controlled -Current treatment: Diet and lifestyle -Medications previously tried: lisinopril, hydrochlorothiazide  -Current home readings: 115/60 -Current dietary habits: reports increased blood sugar readings from deviating from normal diet -Current exercise habits: pool exercise therapy daily -Denies hypotensive/hypertensive symptoms -Educated on BP goals and benefits of medications for prevention of heart attack, stroke and kidney damage; Daily salt intake goal < 2300 mg; Exercise goal of 150 minutes per week; Importance of home blood pressure monitoring; Symptoms of hypotension and importance of maintaining adequate hydration; -Counseled to monitor BP at home monthly, document, and provide log at future appointments -Counseled on diet and exercise extensively  Hyperlipidemia: (LDL goal < 70) -Not ideally controlled -Current treatment: Diet and lifestyle  -Medications previously tried: none reported -Current dietary patterns: has varied from her normal a  little more than normal but getting back on track.  -Current exercise habits: daily pool rehab exercise.  -Educated on Cholesterol goals;  Importance of limiting foods high in cholesterol; Exercise goal of 150 minutes per week; -Counseled on diet and exercise extensively  Diabetes (A1c goal <8%) Lab Results  Component Value Date   HGBA1C 7.3 (H) 04/21/2021   HGBA1C 7.4 (H) 12/28/2020   HGBA1C 7.7 (H) 08/26/2020   Lab Results  Component Value Date   MICROALBUR 80 12/28/2020   LDLCALC 122 (H) 04/21/2021   CREATININE 0.96 04/21/2021   Lab Results  Component Value Date   NA 144 04/21/2021   K 4.1 04/21/2021   CREATININE 0.96 04/21/2021   EGFR 66 04/21/2021   GFRNONAA 67 05/17/2020   GLUCOSE 134 (H) 04/21/2021   Lab Results  Component Value Date   WBC 7.7 04/21/2021   HGB 14.6 04/21/2021   HCT 42.4 04/21/2021   MCV 88 04/21/2021   PLT 311 04/21/2021  -Controlled -Current medications: Trulicity 2.2QJ twice a week Appropriate, Effective, Safe, Accessible PAP Approved 2023 Novolog sliding scale before meals if needed - not using regularly Appropriate, Effective, Safe, Accessible Toujeo 20-23 units twice daily Appropriate, Effective, Safe, Accessible PAP Approved 2023 -Medications previously tried: metformin, Victoza, Humalog (Allergic to an expedient in most injectables) -Current home glucose readings fasting glucose:  July 2022: 100-160 mg/dL December 2022: Patient didn't want to read values, just told me they were in the 300's this weekend but that's because they went to a christmas party and she had cookies. Stated they aren't normally that high -Denies hypoglycemic/hyperglycemic symptoms -Current meal patterns:  Diet: states that she knows variations and her diet is why her a1c isn't better than it is  drinks: milk, water, sage tea, ginger tea -Current exercise: daily pool exercise for rehab -Educated onA1c  and blood sugar goals; Complications of diabetes including  kidney damage, retinal damage, and cardiovascular disease; Exercise goal of 150 minutes per week; Benefits of weight loss; Benefits of routine self-monitoring of blood sugar; Carbohydrate counting and/or plate method -Counseled to check feet daily and get yearly eye exams -Counseled on diet and exercise extensively December 2022: Patient told me, "I was told that the Trulicity should be increased, but I'm afraid of going below 90. If I go below 90, I start to shake and have horrible tremors." -Because patient has a bad Hx with Hypoglycemia, best goal is A1c<8 (Especially since once she turns 66 in a year, that will be her goal). Because of that, recommend maintaining status quo. If further treatment escalation needed, Trulicity would be next option and I explained how that has less Hypo than Toujeo. Patient affirmed understanding November 2023: Patient having no lows. Congratulated on great effort!  Chronic UTI (Goal: Prevent UTI's) UTI's 03/05/22 12/05/21 11/03/21 09/11/21 -Uncontrolled -Current treatment  D-Mannose Powder Appropriate, Effective, Safe, Accessible -Medications previously tried: Nitrofurantoin (Allergies), Cephalexin (Allergies), Bactrim (Allergies) November 2023: Wants to to stick with D-Mannose, doesn't want to try preventative therapy yet. States Doxy and Cipro are only things that work. Spoke about Fosfomycin as a potential. Would like to stick with D-Mannose. States she had stopped taking it over the summer but once she got back on it, kidneys felt better   Patient Goals/Self-Care Activities Over the next 90 days, patient will:  - take medications as prescribed focus on medication adherence by using pill box check glucose 3-4 times daily , document, and provide at future appointments target a minimum of 150 minutes of moderate intensity exercise weekly Reduce carbohydrate intake and work to improve diet since COVID and stomach symptoms.   Follow Up Plan: Telephone  follow up appointment with care management team member scheduled for: April 2024  [place] Constable, [place].D. - [phone]      Ms. Steinhaus was given information about Chronic Care Management services today including:  CCM service includes personalized support from designated clinical staff supervised by her physician, including individualized plan of care and coordination with other care providers 24/7 contact phone numbers for assistance for urgent and routine care needs. Standard insurance, coinsurance, copays and deductibles apply for chronic care management only during months in which we provide at least 20 minutes of these services. Most insurances cover these services at 100%, however patients may be responsible for any copay, coinsurance and/or deductible if applicable. This service may help you avoid the need for more expensive face-to-face services. Only one practitioner may furnish and bill the service in a calendar month. The patient may stop CCM services at any time (effective at the end of the month) by phone call to the office staff.  Patient agreed to services and verbal consent obtained.   The patient verbalized understanding of instructions, educational materials, and care plan provided today and DECLINED offer to receive copy of patient instructions, educational materials, and care plan.  The pharmacy team will reach out to the patient again over the next 60 days.   Lane Hacker, [place]

## 2022-05-08 ENCOUNTER — Telehealth: Payer: Self-pay | Admitting: Legal Medicine

## 2022-05-08 NOTE — Telephone Encounter (Signed)
We can correct information on her forms, thank you for scanning in her insurance, will remove old information and attach new cards before sending to assistance companies.  Billee Cashing, CMA Clinical Pharmacist Assistant [phone]

## 2022-05-08 NOTE — Telephone Encounter (Signed)
Pt came into the office stating all her info on her forms are incorrect - I scanned her insurance in that she said was incorrect as well. I told her I would send a message and someone would contact her as soon as they can.

## 2022-05-10 ENCOUNTER — Telehealth: Payer: Self-pay

## 2022-05-10 NOTE — Telephone Encounter (Signed)
Patient informed that patient assistance for trulicity has arrived and ready for pickup here at the office.

## 2022-05-11 ENCOUNTER — Telehealth: Payer: Self-pay | Admitting: Legal Medicine

## 2022-05-11 NOTE — Telephone Encounter (Signed)
pt was speaking to [employer]" I believe in the lobby of [address]. the pt told me that she got a call from Danielle at the worse time yesterday and she couldn't talk to her she has a emergency with a friend. pt was going to call her while she was here at the office picking up her medicine. I felt as the pt was very rude in the conversation and said "do not send that "shit" to my house" -- she also said that the papers she had was useless and needs to be done right. -- I had a prior encounter with this patient earlier this week about pt assistance information being completely wrong. pt stated she has been here over the last month and she updated her info. she was very rude and demanding that her patient assistance information to changed and updated now. I told her Lysle Dingwall worked remotely I was not sure when she would be into the office. she. she was unpleased with our ways of trying to get her help.

## 2022-05-14 ENCOUNTER — Telehealth: Payer: Self-pay

## 2022-05-14 NOTE — Telephone Encounter (Signed)
Patient called this morning stating that she is wanting a mychart appointment for asthma like sxs, HA, not been out of bed in 24 hours, and she did a home covid test on 05/13/2022 that was negative.  Due to not appointment availability, it was recommended for the patient to do a virtual appointment through [facility]chart with [facility].

## 2022-05-23 ENCOUNTER — Telehealth: Payer: Self-pay

## 2022-05-23 NOTE — Chronic Care Management (AMB) (Signed)
Faxed 2024 re-enrollment applications to [employer] patient assistance for Trulicity and to [employer] patient assistance for [employer].   Billee Cashing, CMA Clinical Pharmacist Assistant [phone]

## 2022-05-24 DIAGNOSIS — I10 Essential (primary) hypertension: Secondary | ICD-10-CM

## 2022-05-24 DIAGNOSIS — Z794 Long term (current) use of insulin: Secondary | ICD-10-CM

## 2022-05-24 DIAGNOSIS — E1169 Type 2 diabetes mellitus with other specified complication: Secondary | ICD-10-CM

## 2022-05-24 DIAGNOSIS — E782 Mixed hyperlipidemia: Secondary | ICD-10-CM

## 2022-06-07 ENCOUNTER — Telehealth: Payer: Self-pay

## 2022-06-07 NOTE — Progress Notes (Signed)
Pt was informed today her PAP through [employer] was outdated and the new updated form is in the front office at Dr. Sedalia Muta to sign. Pt is going first thing tomorrow morning to sign.  Roxana Hires, CMA Clinical Pharmacist Assistant  [phone]

## 2022-06-11 ENCOUNTER — Telehealth: Payer: Self-pay

## 2022-06-11 NOTE — Chronic Care Management (AMB) (Signed)
Notification received from [facility] patient assistance that updated application for 2024 re-enrollment, Refaxed updated [employer] application for toujeo.    Billee Cashing, CMA Clinical Pharmacist Assistant [phone]

## 2022-07-12 ENCOUNTER — Telehealth: Payer: Self-pay

## 2022-07-12 NOTE — Telephone Encounter (Signed)
CALLED AND LEFT DETAILED MESSAGE INFORMING PATIENT THAT PATIENT ASSISTANCE HAS SENT HER TOUJEO AND WE HAVE RECEIVED IT AND FOR HER TO PICK IT UP WHEN SHE GETS A CHANCE.

## 2022-07-12 NOTE — Telephone Encounter (Signed)
Patient has picked up her toujeo. A signed packing list has been put in [place] box.

## 2022-07-17 ENCOUNTER — Telehealth: Payer: Self-pay

## 2022-07-17 NOTE — Telephone Encounter (Signed)
-----  Message from Juliane Poot sent at 07/12/2022  1:10 PM EST ----- Regarding: Pap approval FYI- patient was approved with [employer] to receive Toujeo through 06/25/2023. Letter will be in patient folder, unable to document in chart due to patient being a non [facility].    Pattricia Boss, [place] Pharmacist Assistant [phone]

## 2022-07-24 ENCOUNTER — Ambulatory Visit (INDEPENDENT_AMBULATORY_CARE_PROVIDER_SITE_OTHER): Payer: Medicare Other | Admitting: Physician Assistant

## 2022-07-24 ENCOUNTER — Encounter: Payer: Self-pay | Admitting: Physician Assistant

## 2022-07-24 VITALS — BP 140/78 | HR 115 | Temp 97.1°F | Resp 18 | Ht 66.0 in | Wt 197.0 lb

## 2022-07-24 DIAGNOSIS — N3001 Acute cystitis with hematuria: Secondary | ICD-10-CM | POA: Diagnosis not present

## 2022-07-24 LAB — POCT URINALYSIS DIP (CLINITEK)
Bilirubin, UA: NEGATIVE
Glucose, UA: NEGATIVE mg/dL
Ketones, POC UA: NEGATIVE mg/dL
Nitrite, UA: NEGATIVE
POC PROTEIN,UA: 100 — AB
Spec Grav, UA: >=1.03 — AB (ref 1.010–1.025)
Urobilinogen, UA: 0.2 E.U./dL
pH, UA: 5.5 (ref 5.0–8.0)

## 2022-07-24 MED ORDER — DOXYCYCLINE HYCLATE 100 MG PO TABS: 100.0000 mg | ORAL_TABLET | Freq: Two times a day (BID) | ORAL | 0 refills | Status: DC

## 2022-07-24 NOTE — Progress Notes (Signed)
Acute Office Visit  Subjective:    Patient ID: Suzanne Hardin, female    DOB: [birth year], 67 y.o.   MRN: [id number]  Chief Complaint  Patient presents with   Urinary Tract Infection    HPI Patient is in today for complaints of polyuria and mild sensitivity after voiding.  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 Food, (peanuts, bell peppers, raw onions,pickles) Drug, and Skin, environmental, fragrances & air freshners   Type 2 diabetes mellitus with other specified complication ([place]) 9/98/3382    Past Surgical History:  Procedure Laterality Date   APPENDECTOMY  2000   CATARACT EXTRACTION, BILATERAL     CHOLECYSTECTOMY  2000   TONSILLECTOMY AND ADENOIDECTOMY      Family History  Problem Relation Age of Onset   Ulcers Father    Peripheral Artery Disease Father    Breast cancer Maternal Aunt    Breast cancer Maternal Aunt    Breast cancer Cousin     Social History   Socioeconomic History   Marital status: Divorced    Spouse name: Not on file   Number of children: 2   Years of education: Not on file   Highest education level: Not on file  Occupational History   Occupation: Disabled  Tobacco Use   Smoking status: Never   Smokeless tobacco: Never  Vaping Use   Vaping Use: Never  used  Substance and Sexual Activity   Alcohol use: Never   Drug use: Never   Sexual activity: Yes    Partners: Male  Other Topics Concern   Not on file  Social History Narrative   Not on file   Social Determinants of Health   Financial Resource Strain: High Risk (05/03/2022)   Overall Financial Resource Strain (CARDIA)    Difficulty of Paying Living Expenses: Hard  Food Insecurity: No Food Insecurity (08/29/2020)   Hunger Vital Sign    Worried About Running Out of Food in the Last Year: Never true    Ran Out of Food in the Last Year: Never true  Transportation Needs: No Transportation Needs (05/03/2022)   PRAPARE - Hydrologist (Medical): No    Lack of Transportation (Non-Medical): No  Physical Activity: Sufficiently Active (08/29/2020)   Exercise Vital Sign    Days of Exercise per Week: 5 days    Minutes of Exercise per Session: 30 min  Stress: Not on file  Social Connections: Not on file  Intimate Partner Violence: Not on file     Current Outpatient Medications:    Alpha-Lipoic Acid 200 MG CAPS, Take by mouth., Disp: , Rfl:    baclofen (LIORESAL) 10 MG tablet, Take 10 mg by mouth 3 (three) times daily., Disp: , Rfl:  Blood Glucose Calibration (ONETOUCH VERIO) SOLN, 1 each by In Vitro route every 30 (thirty) days., Disp: 1 each, Rfl: 2   Blood Glucose Monitoring Suppl (ONETOUCH VERIO) w/Device KIT, 1 each by Does not apply route 3 (three) times daily., Disp: 1 kit, Rfl: 0   Cholecalciferol (VITAMIN D) 50 MCG (2000 UT) tablet, Take 2,000 Units by mouth daily., Disp: , Rfl:    CONTOUR NEXT TEST test strip, USE AS INSTRUCTED, Disp: 100 strip, Rfl: 6   Cyanocobalamin 1000 MCG TBCR, Take 1 tablet by mouth daily., Disp: , Rfl:    D-Mannose POWD, Take by mouth., Disp: , Rfl:    doxycycline (VIBRA-TABS) 100 MG tablet, Take 1 tablet (100 mg total) by mouth 2 (two) times daily., Disp: 20 tablet, Rfl: 0   Dulaglutide 1.5 MG/0.5ML SOPN, Inject 1.5 mg into the  skin 2 (two) times a week., Disp: , Rfl:    hydrOXYzine (ATARAX) 10 MG/5ML syrup, Take by mouth., Disp: , Rfl:    Lancets MISC, by Does not apply route., Disp: , Rfl:    NOVOLOG FLEXPEN 100 UNIT/ML FlexPen, Patient uses slidding scale. Maximum 30 units uses only blood sugar >250mg /dl. Using 8-10 units when sugars are high lately., Disp: 15 mL, Rfl: 1   pyridOXINE (VITAMIN B-6) 50 MG tablet, Take 25 mg by mouth daily. Takes the p5pb6 version 25 mg daily, Disp: , Rfl:    Sodium Chloride-Sodium Bicarb (NETI POT SINUS [place] NA), Place into the nose as needed., Disp: , Rfl:    TOUJEO SOLOSTAR 300 UNIT/ML Solostar Pen, Inject 23 Units into the skin daily. (Patient taking differently: Inject 20 Units into the skin in the morning and at bedtime. 20-25 units in the morning and 20-25 in the evening), Disp: 6 pen, Rfl: 6   TRIAMCINOLONE ACETONIDE, TOP, 0.05 % OINT, Apply topically daily as needed. Uses as needed for rash, bug bites or on gums before dentist., Disp: , Rfl:    Zinc 25 MG TABS, Take by mouth., Disp: , Rfl:    Allergies  Allergen Reactions   Dextromethorphan-Guaifenesin Itching   Dorzolamide Hcl-Timolol Mal Nausea And Vomiting    Brachycardia, severe headache, swelling Other reaction(s): Other (See Comments) Brachycardia, severe headache, swelling Brachycardia, severe headache, swelling   Hydrocodone Other (See Comments)    Inflammatory Inflammatory Other reaction(s): Other (See Comments) Inflammatory Inflammatory    Lidocaine Swelling   Other Rash, Other (See Comments) and Swelling    Neurological damage fragances fragances  Neurological damage Fragances; YELLOW NUMBER 5;  Yellow #5 Yellow #5 Yellow #5 Yellow #5  High eye pressure High eye pressure High eye pressure    Sodium Laureth Sulfate Dermatitis, Hives, Itching, Other (See Comments), Rash and Swelling   Sulfa Antibiotics Hives   Sulfites Shortness Of Breath    Other reaction(s): Shortness of Breath Other  reaction(s): Shortness of Breath    Statins Hives and Other (See Comments)   Articaine-Epinephrine Swelling   Cymbalta [Duloxetine Hcl]    Darvon [Propoxyphene]    Duloxetine    Gentamicin Sulfate    Hctz [Hydrochlorothiazide]    Humalog [Insulin Lispro]    Keflex [Cephalexin]    Lasix [Furosemide]    Lisinopril-Hydrochlorothiazide    Loratadine    Macrodantin [Nitrofurantoin]    Metformin    Mucinex [Guaifenesin Er]    Peanut-Containing Drug Products    Penicillins    Pioglitazone    Tessalon Perles [Benzonatate]    Travatan [Travoprost]    Tylenol [Acetaminophen]    Propofol Other (  See Comments) and Nausea And Vomiting   Sulfamethoxazole-Trimethoprim Rash    CONSTITUTIONAL: Negative for chills, fatigue, fever,  CARDIOVASCULAR: Negative for chest pain,  RESPIRATORY: Negative for recent cough and dyspnea.  GASTROINTESTINAL: Negative for abdominal pain,  nausea and vomiting.  GU - see HPI      Objective:    PHYSICAL EXAM:   VS: BP (!) 140/78   Pulse (!) 115   Temp (!) 97.1 F (36.2 C)   Resp 18   Ht 5\' 6"  (1.676 m)   Wt 197 lb (89.4 kg)   SpO2 100%   BMI 31.80 kg/m   GEN: Well nourished, well developed, in no acute distress  Cardiac: RRR; no murmurs, Respiratory:  normal respiratory rate and pattern with no distress - normal breath sounds with no rales, rhonchi, wheezes or rubs  Office Visit on 07/24/2022  Component Date Value Ref Range Status   Color, UA 07/24/2022 yellow  yellow Final   Clarity, UA 07/24/2022 cloudy (A)  clear Final   Glucose, UA 07/24/2022 negative  negative mg/dL Final   Bilirubin, UA 07/24/2022 negative  negative Final   Ketones, POC UA 07/24/2022 negative  negative mg/dL Final   Spec Grav, UA 07/24/2022 >=1.030 (A)  1.010 - 1.025 Final   Blood, UA 07/24/2022 trace-lysed (A)  negative Final   pH, UA 07/24/2022 5.5  5.0 - 8.0 Final   POC PROTEIN,UA 07/24/2022 =100 (A)  negative, trace Final   Urobilinogen, UA 07/24/2022 0.2  0.2 or  1.0 E.U./dL Final   Nitrite, UA 07/24/2022 Negative  Negative Final   Leukocytes, UA 07/24/2022 Large (3+) (A)  Negative Final     Wt Readings from Last 3 Encounters:  07/24/22 197 lb (89.4 kg)  05/02/22 196 lb 12.8 oz (89.3 kg)  03/05/22 200 lb 12.8 oz (91.1 kg)    Health Maintenance Due  Topic Date Due   Hepatitis C Screening  Never done   DTaP/Tdap/Td (1 - Tdap) Never done   DEXA SCAN  Never done   Medicare Annual Wellness (AWV)  05/15/2022    There are no preventive care reminders to display for this patient.        Assessment & Plan:   Problem List Items Addressed This Visit       Genitourinary   Acute cystitis with hematuria - Primary   Relevant Medications   doxycycline (VIBRA-TABS) 100 MG tablet   Other Relevant Orders   POCT URINALYSIS DIP (CLINITEK) (Completed)   Urine Culture     Meds ordered this encounter  Medications   doxycycline (VIBRA-TABS) 100 MG tablet    Sig: Take 1 tablet (100 mg total) by mouth 2 (two) times daily.    Dispense:  20 tablet    Refill:  0    Order Specific Question:   Supervising Provider    Answer:   COX, KIRSTEN [[id number]]     [place], PA-C

## 2022-07-25 ENCOUNTER — Telehealth: Payer: Self-pay

## 2022-07-25 NOTE — Telephone Encounter (Signed)
Called patient and made aware that we have received her Trulicity in two big packs. Patient will come by to get it.

## 2022-07-27 LAB — URINE CULTURE

## 2022-08-02 LAB — HM DIABETES EYE EXAM

## 2022-08-16 NOTE — Assessment & Plan Note (Addendum)
Controlled off medicines. Continue to work on eating a healthy diet and exercise.  Labs drawn today.

## 2022-08-16 NOTE — Assessment & Plan Note (Signed)
Check labs 

## 2022-08-16 NOTE — Assessment & Plan Note (Addendum)
Comorbidities: hyperlipidemia and hypertension. Control: Fair Recommend check sugars fasting daily. Recommend check feet daily. Recommend annual eye exams. Medicines: Dulaglutide1.5 mg two times per week, Toujeo 20-25 units Twice a day. Continue to work on eating a healthy diet and exercise.  Labs drawn today.

## 2022-08-16 NOTE — Progress Notes (Unsigned)
Subjective:  Patient ID: Suzanne Hardin, female    DOB: [birth year]  Age: 67 y.o. MRN: [id number]  Chief Complaint  Patient presents with   Diabetes   Hypertension    HPI Diabetes:  Complications: Glucose checking:Daily Glucose logs:120-250 Hypoglycemia: no Most recent A1C: 7.3% Current medications: Dulaglutide1.5 mg two times per week, Toujeo 20-25 units Twice a day. Last Eye Exam: 07/26/2021 Foot checks: daily  Hyperlipidemia: Current medications: No medication  Hypertension: Complications: Current medications: None     08/17/2022    8:26 AM 05/15/2021   11:24 AM 02/10/2021    9:36 AM 01/15/2020    8:18 AM  Depression screen PHQ 2/9  Decreased Interest 0 0 0 0  Down, Depressed, Hopeless 0 0 0 0  PHQ - 2 Score 0 0 0 0         01/15/2020    8:37 AM 02/10/2021    9:36 AM 05/15/2021   11:24 AM 08/17/2022    8:26 AM  Fall Risk  Falls in the past year? 0 0 0 0  Was there an injury with Fall? 0 0 0 0  Fall Risk Category Calculator 0 0 0 0  Fall Risk Category (Retired) Low Low Low   (RETIRED) Patient Fall Risk Level Low fall risk Low fall risk Moderate fall risk   Patient at Risk for Falls Due to   History of fall(s) No Fall Risks  Fall risk Follow up Falls evaluation completed  Falls prevention discussed Falls evaluation completed      Review of Systems  Constitutional:  Negative for appetite change, fatigue and fever.  HENT:  Positive for congestion and rhinorrhea. Negative for ear pain, sinus pressure and sore throat.   Respiratory:  Negative for cough, chest tightness, shortness of breath and wheezing.   Cardiovascular:  Negative for chest pain and palpitations.  Gastrointestinal:  Negative for abdominal pain, constipation, diarrhea, nausea and vomiting.  Genitourinary:  Negative for dysuria and hematuria.  Musculoskeletal:  Negative for arthralgias, back pain, joint swelling and myalgias.  Skin:  Negative for rash.  Neurological:  Negative for dizziness,  weakness and headaches.  Psychiatric/Behavioral:  Negative for dysphoric mood. The patient is not nervous/anxious.     Current Outpatient Medications on File Prior to Visit  Medication Sig Dispense Refill   Alpha-Lipoic Acid 200 MG CAPS Take by mouth.     baclofen (LIORESAL) 10 MG tablet Take 10 mg by mouth 3 (three) times daily.     Blood Glucose Calibration (ONETOUCH VERIO) SOLN 1 each by In Vitro route every 30 (thirty) days. 1 each 2   Blood Glucose Monitoring Suppl (ONETOUCH VERIO) w/Device KIT 1 each by Does not apply route 3 (three) times daily. 1 kit 0   Cholecalciferol (VITAMIN D) 50 MCG (2000 UT) tablet Take 2,000 Units by mouth in the morning and at bedtime.     CONTOUR NEXT TEST test strip USE AS INSTRUCTED 100 strip 6   Cyanocobalamin 1000 MCG TBCR Take 1 tablet by mouth daily.     D-Mannose POWD Take by mouth.     Dulaglutide 1.5 MG/0.5ML SOPN Inject 1.5 mg into the skin 2 (two) times a week.     hydrOXYzine (ATARAX) 10 MG/5ML syrup Take by mouth.     Lancets MISC by Does not apply route.     NOVOLOG FLEXPEN 100 UNIT/ML FlexPen Patient uses slidding scale. Maximum 30 units uses only blood sugar >'250mg'$ /dl. Using 8-10 units when sugars are high lately. 15 mL  1   pyridOXINE (VITAMIN B-6) 50 MG tablet Take 25 mg by mouth daily. Takes the p5pb6 version 25 mg daily     TOUJEO SOLOSTAR 300 UNIT/ML Solostar Pen Inject 23 Units into the skin daily. (Patient taking differently: Inject 20 Units into the skin in the morning and at bedtime. 20-25 units in the morning and 20-25 in the evening) 6 pen 6   TRIAMCINOLONE ACETONIDE, TOP, 0.05 % OINT Apply topically daily as needed. Uses as needed for rash, bug bites or on gums before dentist.     Zinc 25 MG TABS Take by mouth.     doxycycline (VIBRA-TABS) 100 MG tablet Take 1 tablet (100 mg total) by mouth 2 (two) times daily. 20 tablet 0   No current facility-administered medications on file prior to visit.   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 Food, (peanuts, bell peppers, raw onions,pickles) Drug, and Skin, environmental, fragrances & air freshners   Type 2 diabetes mellitus with other specified complication ([place]) [id number]   Past Surgical History:  Procedure Laterality Date   APPENDECTOMY  2000   CATARACT EXTRACTION, BILATERAL     CHOLECYSTECTOMY  2000   TONSILLECTOMY AND ADENOIDECTOMY      Family History  Problem Relation Age of Onset   Ulcers Father    Peripheral Artery Disease Father    Breast cancer Maternal Aunt    Breast cancer Maternal Aunt    Breast cancer Cousin    Social History   Socioeconomic History   Marital status: Divorced    Spouse name: Not on file   Number of children: 2   Years of education: Not on file   Highest education level: Not on file  Occupational History   Occupation: Disabled  Tobacco Use   Smoking status: Never   Smokeless tobacco: Never  Vaping Use   Vaping Use: Never used  Substance and Sexual Activity   Alcohol use: Never   Drug use: Never   Sexual activity: Yes    Partners: Male  Other Topics Concern   Not on file  Social History Narrative   Not on file   Social Determinants of Health   Financial Resource Strain: High Risk (05/03/2022)   Overall Financial Resource Strain (CARDIA)    Difficulty of Paying Living Expenses: Hard  Food Insecurity: No Food Insecurity (08/29/2020)   Hunger Vital Sign    Worried About Running Out of Food in the Last Year: Never true    Ran Out of Food in the Last Year: Never true  Transportation Needs: No Transportation Needs (05/03/2022)    PRAPARE - Hydrologist (Medical): No    Lack of Transportation (Non-Medical): No  Physical Activity: Sufficiently Active (08/29/2020)   Exercise Vital Sign    Days of Exercise per Week: 5 days    Minutes of Exercise per Session: 30 min  Stress: Not on file  Social Connections: Not on file    Objective:  BP 136/72 (BP Location: Left Arm, Patient Position: Sitting)   Pulse 84   Temp (!) 97.2 F (36.2  C) (Temporal)   Ht '5\' 6"'$  (1.676 m)   Wt 195 lb 12.8 oz (88.8 kg)   SpO2 99%   BMI 31.60 kg/m      08/17/2022    8:52 AM 08/17/2022    8:40 AM 07/24/2022    1:30 PM  BP/Weight  Systolic BP [id number]  Diastolic BP 72 78 78  Wt. (Lbs)  195.8 197  BMI  31.6 kg/m2 31.8 kg/m2    Physical Exam Vitals reviewed.  Constitutional:      Appearance: Normal appearance. She is normal weight.  HENT:     Nose: Rhinorrhea present.     Mouth/Throat:     Pharynx: No oropharyngeal exudate.  Neck:     Vascular: No carotid bruit.  Cardiovascular:     Rate and Rhythm: Normal rate and regular rhythm.     Pulses: Normal pulses.     Heart sounds: Normal heart sounds.  Pulmonary:     Effort: Pulmonary effort is normal.     Breath sounds: Normal breath sounds.  Abdominal:     General: Abdomen is flat. Bowel sounds are normal.     Palpations: Abdomen is soft.     Tenderness: There is no abdominal tenderness.  Neurological:     Mental Status: She is alert and oriented to person, place, and time.  Psychiatric:        Mood and Affect: Mood normal.        Behavior: Behavior normal.     Diabetic Foot Exam - Simple   Simple Foot Form Diabetic Foot exam was performed with the following findings: Yes 08/17/2022  8:45 AM  Visual Inspection No deformities, no ulcerations, no other skin breakdown bilaterally: Yes Sensation Testing Intact to touch and monofilament testing bilaterally: Yes Pulse Check Posterior Tibialis and Dorsalis pulse intact bilaterally:  Yes Comments      Lab Results  Component Value Date   WBC 10.7 08/17/2022   HGB 15.4 08/17/2022   HCT 44.1 08/17/2022   PLT 346 08/17/2022   GLUCOSE 133 (H) 08/17/2022   CHOL 185 08/17/2022   TRIG 87 08/17/2022   HDL 49 08/17/2022   LDLCALC 120 (H) 08/17/2022   ALT 26 08/17/2022   AST 30 08/17/2022   NA 142 08/17/2022   K 3.9 08/17/2022   CL 101 08/17/2022   CREATININE 0.93 08/17/2022   BUN 9 08/17/2022   CO2 24 08/17/2022   TSH 2.560 12/28/2020   HGBA1C 8.0 (H) 08/17/2022   MICROALBUR 80 12/28/2020      Assessment & Plan:    Essential hypertension Assessment & Plan: Controlled off medicines. Continue to work on eating a healthy diet and exercise.  Labs drawn today.    Orders: -     Comprehensive metabolic panel -     CBC with Differential/Platelet -     Cardiovascular Risk Assessment  Type 2 diabetes mellitus with other specified complication, with long-term current use of insulin (HCC) Assessment & Plan: Comorbidities: hyperlipidemia and hypertension. Control: Fair Recommend check sugars fasting daily. Recommend check feet daily. Recommend annual eye exams. Medicines: Dulaglutide1.5 mg two times per week, Toujeo 20-25 units Twice a day. Continue to work on eating a healthy diet and exercise.  Labs drawn today.     Orders: -     Hemoglobin A1c -     Microalbumin / creatinine urine ratio  Vitamin D deficiency Assessment & Plan: Check labs  Orders: -     VITAMIN D 25 Hydroxy (Vit-D  Deficiency, Fractures)  Mixed hyperlipidemia Assessment & Plan: Well controlled.  No medicines.  Continue to work on eating a healthy diet and exercise.  Labs drawn today.    Orders: -     Lipid panel  Statin myopathy Assessment & Plan: Intolerant to statins.       No orders of the defined types were placed in this encounter.   Orders Placed This Encounter  Procedures   Comprehensive metabolic panel   Hemoglobin A1c   Lipid panel   CBC with  Differential/Platelet   Microalbumin / creatinine urine ratio   VITAMIN D 25 Hydroxy (Vit-D Deficiency, Fractures)   Cardiovascular Risk Assessment     Follow-up: No follow-ups on file.   I,Marla I Leal-Borjas,acting as a scribe for Rochel Brome, MD.,have documented all relevant documentation on the behalf of Rochel Brome, MD,as directed by  Rochel Brome, MD while in the presence of Rochel Brome, MD.   An After Visit Summary was printed and given to the patient. I attest that I have reviewed this visit and agree with the plan scribed by my staff.   Rochel Brome, MD [facility] [phone]

## 2022-08-16 NOTE — Assessment & Plan Note (Addendum)
Well controlled.  No medicines.  Continue to work on eating a healthy diet and exercise.  Labs drawn today.

## 2022-08-17 ENCOUNTER — Ambulatory Visit (INDEPENDENT_AMBULATORY_CARE_PROVIDER_SITE_OTHER): Payer: Medicare Other | Admitting: Family Medicine

## 2022-08-17 ENCOUNTER — Encounter: Payer: Self-pay | Admitting: Family Medicine

## 2022-08-17 VITALS — BP 136/72 | HR 84 | Temp 97.2°F | Ht 66.0 in | Wt 195.8 lb

## 2022-08-17 DIAGNOSIS — I10 Essential (primary) hypertension: Secondary | ICD-10-CM | POA: Diagnosis not present

## 2022-08-17 DIAGNOSIS — E1169 Type 2 diabetes mellitus with other specified complication: Secondary | ICD-10-CM

## 2022-08-17 DIAGNOSIS — E782 Mixed hyperlipidemia: Secondary | ICD-10-CM | POA: Diagnosis not present

## 2022-08-17 DIAGNOSIS — E559 Vitamin D deficiency, unspecified: Secondary | ICD-10-CM | POA: Diagnosis not present

## 2022-08-17 DIAGNOSIS — Z794 Long term (current) use of insulin: Secondary | ICD-10-CM

## 2022-08-17 DIAGNOSIS — G720 Drug-induced myopathy: Secondary | ICD-10-CM

## 2022-08-17 DIAGNOSIS — T466X5A Adverse effect of antihyperlipidemic and antiarteriosclerotic drugs, initial encounter: Secondary | ICD-10-CM

## 2022-08-19 LAB — LIPID PANEL
Chol/HDL Ratio: 3.8 ratio (ref 0.0–4.4)
Cholesterol, Total: 185 mg/dL (ref 100–199)
HDL: 49 mg/dL (ref >39)
LDL Chol Calc (NIH): 120 mg/dL — ABNORMAL HIGH (ref 0–99)
Triglycerides: 87 mg/dL (ref 0–149)
VLDL Cholesterol Cal: 16 mg/dL (ref 5–40)

## 2022-08-19 LAB — CBC WITH DIFFERENTIAL/PLATELET
Basophils Absolute: 0.1 10*3/uL (ref 0.0–0.2)
Basos: 1 %
EOS (ABSOLUTE): 0.6 10*3/uL — ABNORMAL HIGH (ref 0.0–0.4)
Eos: 5 %
Hematocrit: 44.1 % (ref 34.0–46.6)
Hemoglobin: 15.4 g/dL (ref 11.1–15.9)
Immature Grans (Abs): 0.2 10*3/uL — ABNORMAL HIGH (ref 0.0–0.1)
Immature Granulocytes: 2 %
Lymphocytes Absolute: 4.2 10*3/uL — ABNORMAL HIGH (ref 0.7–3.1)
Lymphs: 39 %
MCH: 30.4 pg (ref 26.6–33.0)
MCHC: 34.9 g/dL (ref 31.5–35.7)
MCV: 87 fL (ref 79–97)
Monocytes Absolute: 0.5 10*3/uL (ref 0.1–0.9)
Monocytes: 4 %
Neutrophils Absolute: 5.3 10*3/uL (ref 1.4–7.0)
Neutrophils: 49 %
Platelets: 346 10*3/uL (ref 150–450)
RBC: 5.07 x10E6/uL (ref 3.77–5.28)
RDW: 12.6 % (ref 11.7–15.4)
WBC: 10.7 10*3/uL (ref 3.4–10.8)

## 2022-08-19 LAB — MICROALBUMIN / CREATININE URINE RATIO
Creatinine, Urine: 270.7 mg/dL
Microalb/Creat Ratio: 49 mg/g creat — ABNORMAL HIGH (ref 0–29)
Microalbumin, Urine: 131.6 ug/mL

## 2022-08-19 LAB — COMPREHENSIVE METABOLIC PANEL
ALT: 26 IU/L (ref 0–32)
AST: 30 IU/L (ref 0–40)
Albumin/Globulin Ratio: 1.8 (ref 1.2–2.2)
Albumin: 4.2 g/dL (ref 3.9–4.9)
Alkaline Phosphatase: 93 IU/L (ref 44–121)
BUN/Creatinine Ratio: 10 — ABNORMAL LOW (ref 12–28)
BUN: 9 mg/dL (ref 8–27)
Bilirubin Total: 0.4 mg/dL (ref 0.0–1.2)
CO2: 24 mmol/L (ref 20–29)
Calcium: 9.6 mg/dL (ref 8.7–10.3)
Chloride: 101 mmol/L (ref 96–106)
Creatinine, Ser: 0.93 mg/dL (ref 0.57–1.00)
Globulin, Total: 2.3 g/dL (ref 1.5–4.5)
Glucose: 133 mg/dL — ABNORMAL HIGH (ref 70–99)
Potassium: 3.9 mmol/L (ref 3.5–5.2)
Sodium: 142 mmol/L (ref 134–144)
Total Protein: 6.5 g/dL (ref 6.0–8.5)
eGFR: 68 mL/min/{1.73_m2} (ref >59)

## 2022-08-19 LAB — HEMOGLOBIN A1C
Est. average glucose Bld gHb Est-mCnc: 183 mg/dL
Hgb A1c MFr Bld: 8 % — ABNORMAL HIGH (ref 4.8–5.6)

## 2022-08-19 LAB — CARDIOVASCULAR RISK ASSESSMENT

## 2022-08-19 LAB — VITAMIN D 25 HYDROXY (VIT D DEFICIENCY, FRACTURES): Vit D, 25-Hydroxy: 44.2 ng/mL (ref 30.0–100.0)

## 2022-08-19 NOTE — Assessment & Plan Note (Signed)
Intolerant to statins. 

## 2022-08-20 ENCOUNTER — Other Ambulatory Visit: Payer: Self-pay | Admitting: Family Medicine

## 2022-08-20 DIAGNOSIS — E782 Mixed hyperlipidemia: Secondary | ICD-10-CM

## 2022-08-20 DIAGNOSIS — Z794 Long term (current) use of insulin: Secondary | ICD-10-CM

## 2022-08-20 DIAGNOSIS — I10 Essential (primary) hypertension: Secondary | ICD-10-CM

## 2022-08-20 NOTE — Progress Notes (Signed)
Blood count normal.  Liver function normal.  Kidney function normal.  Cholesterol: LDL 120. Low fat diet.  HBA1C: 8.0. Poorly controlled. Worsened. Recommend increase trulicity to 3.0 weekly.  Spilling protein in urine. Recommend losartan 25 mg daily.  Vitamin D normal.

## 2022-08-21 ENCOUNTER — Telehealth: Payer: Self-pay

## 2022-08-21 ENCOUNTER — Other Ambulatory Visit: Payer: Self-pay | Admitting: Family Medicine

## 2022-08-21 NOTE — Telephone Encounter (Signed)
Patient is allergic to Losartan, chart was flagged.  Dr. Tobie Poet has sent a referral to the pharmacy to help with medication.  She is working on her Trulicity with dose adjustment but is having issues with low blood sugars.  Her reading this morning was 79.  Dr. Tobie Poet is aware of low reading and that patient is working on adjusting her medication.  She is going to call us back in 1 week if she has not had a call from the pharmacy.

## 2022-08-30 ENCOUNTER — Telehealth: Payer: Self-pay

## 2022-08-30 NOTE — Progress Notes (Signed)
  Chronic Care Management   Note  08/30/2022 Name: Suzanne Hardin MRN: [id number] DOB: 1956/06/21  Suzanne Hardin is a 68 y.o. year old female who is a primary care patient of Lillard Anes, MD (Inactive). I reached out to Suzanne Hardin by phone today in response to a referral sent by Ms. Allen PCP.  Suzanne Hardin was given information about Chronic Care Management services today including:  CCM service includes personalized support from designated clinical staff supervised by the physician, including individualized plan of care and coordination with other care providers 24/7 contact phone numbers for assistance for urgent and routine care needs. Service will only be billed when office clinical staff spend 20 minutes or more in a month to coordinate care. Only one practitioner may furnish and bill the service in a calendar month. The patient may stop CCM services at amy time (effective at the end of the month) by phone call to the office staff. The patient will be responsible for cost sharing (co-pay) or up to 20% of the service fee (after annual deductible is met)  Ms. Suzanne Hardin  agreedto scheduling an appointment with the CCM RN Case Manager   Follow up plan: Patient agreed to scheduled appointment with RN Case Manager on 09/06/2022 and Pharm d 09/24/2022(date/time).   Noreene Larsson, [place],  [address] Direct Dial: [phone] [email or web address]

## 2022-09-06 ENCOUNTER — Ambulatory Visit (INDEPENDENT_AMBULATORY_CARE_PROVIDER_SITE_OTHER): Payer: Medicare Other

## 2022-09-06 ENCOUNTER — Telehealth: Payer: Medicare Other

## 2022-09-06 DIAGNOSIS — E1169 Type 2 diabetes mellitus with other specified complication: Secondary | ICD-10-CM

## 2022-09-06 DIAGNOSIS — I10 Essential (primary) hypertension: Secondary | ICD-10-CM

## 2022-09-06 DIAGNOSIS — E782 Mixed hyperlipidemia: Secondary | ICD-10-CM

## 2022-09-06 NOTE — Chronic Care Management (AMB) (Signed)
Chronic Care Management   CCM RN Visit Note  09/06/2022 Name: Suzanne Hardin MRN: [id number] DOB: 1955-09-26  Subjective: Suzanne Hardin is a 67 y.o. year old female who is a primary care patient of Lillard Anes, MD (Inactive). The patient was referred to the Chronic Care Management team for assistance with care management needs subsequent to provider initiation of CCM services and plan of care.    Today's Visit:  Engaged with patient by telephone for initial visit.     SDOH Interventions Today    Flowsheet Row Most Recent Value  SDOH Interventions   Food Insecurity Interventions Intervention Not Indicated  Housing Interventions Intervention Not Indicated  Transportation Interventions Intervention Not Indicated  Utilities Interventions Intervention Not Indicated  Alcohol Usage Interventions Intervention Not Indicated (Score <7)  Financial Strain Interventions Other (Comment)  [the patient gets assistance with PAP- medications]  Physical Activity Interventions Intervention Not Indicated  Stress Interventions Intervention Not Indicated  Social Connections Interventions Intervention Not Indicated         Goals Addressed             This Visit's Progress    CCM Expected Outcome:  Monitor, Self-Manage and Reduce Symptoms of Diabetes       Current Barriers:  Chronic Disease Management support and education needs related to effective management of DM  Planned Interventions: Provided education to patient about basic DM disease process; Reviewed medications with patient and discussed importance of medication adherence. She is compliant with medications. Worked with the pharm D in the past for effective management of medications. Has upcoming appointment with the pharm D;        Reviewed prescribed diet with patient heart healthy/ADA diet; Counseled on importance of regular laboratory monitoring as prescribed;        Discussed plans with patient for ongoing care  management follow up and provided patient with direct contact information for care management team;      Provided patient with written educational materials related to hypo and hyperglycemia and importance of correct treatment. The patient states that her blood sugar this am was 92. She states she does not want it to get under 90. She states it is usually consistent around 125;       Reviewed scheduled/upcoming provider appointments including: 12-19-2022 at 0900 am;         call provider for findings outside established parameters;       Referral made to pharmacy team for assistance with has an appointment with the pharm D on 09-24-2022 for medication support and education and effective medication management;       Review of patient status, including review of consultants reports, relevant laboratory and other test results, and medications completed;       Advised patient to discuss changes in her DM and DM health and well being with provider;      Screening for signs and symptoms of depression related to chronic disease state;        Assessed social determinant of health barriers;         Symptom Management: Take medications as prescribed   Attend all scheduled provider appointments Call provider office for new concerns or questions  call the Suicide and Crisis Lifeline: 988 call the [place] National Suicide Prevention Lifeline: 801 511 5124 or TTY: 442-864-2314 TTY 701-591-3386) to talk to a trained counselor call 1-800-273-TALK (toll free, 24 hour hotline) go to [facility] [address], [place] [phone]) if experiencing a  Mental Health or [place]  check feet daily for cuts, sores or redness trim toenails straight across wash and dry feet carefully every day wear comfortable, cotton socks wear comfortable, well-fitting shoes  Follow Up Plan: Telephone follow up appointment with care management team member scheduled for: 11-15-2022  at 0945 am       CCM Expected Outcome:  Monitor, Self-Manage and Reduce Symptoms of: HLD       Current Barriers:  Chronic Disease Management support and education needs related to effective management of HLD Lab Results  Component Value Date   CHOL 185 08/17/2022   HDL 49 08/17/2022   LDLCALC 120 (H) 08/17/2022   TRIG 87 08/17/2022   CHOLHDL 3.8 08/17/2022     Planned Interventions: Provider established cholesterol goals reviewed; Counseled on importance of regular laboratory monitoring as prescribed; Provided HLD educational materials; Reviewed role and benefits of statin for ASCVD risk reduction. The patient cannot take statins due to her history. The patient provided a lot of essential information relevant to the patients health history; Discussed strategies to manage statin-induced myalgias; Reviewed importance of limiting foods high in cholesterol. The patient has several allergies to medications and foods. Has to be careful about the foods she eats and medications she takes. ; Screening for signs and symptoms of depression related to chronic disease state;  Assessed social determinant of health barriers;   Symptom Management: Take medications as prescribed   Attend all scheduled provider appointments Call provider office for new concerns or questions  call the Suicide and Crisis Lifeline: 988 call the [place] National Suicide Prevention Lifeline: 904-777-3983 or TTY: (503) 372-6881 TTY 563 847 6492) to talk to a trained counselor call 1-800-273-TALK (toll free, 24 hour hotline) go to [facility] [address] [place] [phone]) if experiencing a Mental Health or [place]  - call for medicine refill 2 or 3 days before it runs out - take all medications exactly as prescribed - call doctor with any symptoms you believe are related to your medicine - call doctor when you experience any new symptoms - go to all doctor  appointments as scheduled  Follow Up Plan: Telephone follow up appointment with care management team member scheduled for: 11-15-2022 at 0945 am       CCM Expected Outcome:  Monitor, Self-Manage, and Reduce Symptoms of Hypertension       Current Barriers:  Chronic Disease Management support and education needs related to effective management of HTN  BP Readings from Last 3 Encounters:  09/06/22 107/66  08/17/22 136/72  07/24/22 (!) 140/78   Self reported: 107/66 with pulse: 70 and 116/70  Planned Interventions: Evaluation of current treatment plan related to hypertension self management and patient's adherence to plan as established by provider. The patient states that her blood pressures are stable at this time. She is not having any inflammatory responses going on. When her blood pressures start going up she knows she needs to be aware of her body systems.;   Provided education to patient re: stroke prevention, s/s of heart attack and stroke; Reviewed prescribed diet Heart healthy/ADA diet Reviewed medications with patient and discussed importance of compliance. The patient is compliant with medications. She has several drug allergies and food allergies. She is very knowledgeable about what she can take and not take. Has upcoming pharm D consult for effective management of medications and disease processes;  Discussed plans with patient for ongoing care management follow up and provided patient with direct  contact information for care management team; Advised patient, providing education and rationale, to monitor blood pressure daily and record, calling PCP for findings outside established parameters;  Reviewed scheduled/upcoming provider appointments including: 12-19-2022 at 0900 am Advised patient to discuss changes in her HTN or heart healthy with provider; Provided education on prescribed diet heart healthy/ADA;  Discussed complications of poorly controlled blood pressure such as heart  disease, stroke, circulatory complications, vision complications, kidney impairment, sexual dysfunction;  Screening for signs and symptoms of depression related to chronic disease state;  Assessed social determinant of health barriers;   Symptom Management: Take medications as prescribed   Attend all scheduled provider appointments Call provider office for new concerns or questions  call the Suicide and Crisis Lifeline: 988 call the [place] National Suicide Prevention Lifeline: 415-663-2828 or TTY: 6201015810 TTY (781)374-4195) to talk to a trained counselor call 1-800-273-TALK (toll free, 24 hour hotline) go to [facility] [address] [place] [phone]) if experiencing a [place] or [place]  check blood pressure 3 times per week write blood pressure results in a log or diary learn about high blood pressure keep a blood pressure log take blood pressure log to all doctor appointments call doctor for signs and symptoms of high blood pressure develop an action plan for high blood pressure keep all doctor appointments take medications for blood pressure exactly as prescribed report new symptoms to your doctor  Follow Up Plan: Telephone follow up appointment with care management team member scheduled for: 11-15-2022 at 0945 am          Plan:Telephone follow up appointment with care management team member scheduled for:  11-15-2022 at [place] am  Noreene Larsson RN, MSN, CCM RN Care Manager  Chronic Care Management Direct Number: [phone]

## 2022-09-06 NOTE — Patient Instructions (Addendum)
Please call the care guide team at [phone] if you need to cancel or reschedule your appointment.   If you are experiencing a Mental Health or [place] or need someone to talk to, please call the Suicide and Crisis Lifeline: 988 call the [place] National Suicide Prevention Lifeline: 620-278-3096 or TTY: 604-805-8466 TTY (628)086-0078) to talk to a trained counselor call 1-800-273-TALK (toll free, 24 hour hotline) go to [facility] [place] [phone])   Following is a copy of the CCM Program Consent:  CCM service includes personalized support from designated clinical staff supervised by the physician, including individualized plan of care and coordination with other care providers 24/7 contact phone numbers for assistance for urgent and routine care needs. Service will only be billed when office clinical staff spend 20 minutes or more in a month to coordinate care. Only one practitioner may furnish and bill the service in a calendar month. The patient may stop CCM services at amy time (effective at the end of the month) by phone call to the office staff. The patient will be responsible for cost sharing (co-pay) or up to 20% of the service fee (after annual deductible is met)  Following is a copy of your full provider care plan:   Goals Addressed             This Visit's Progress    CCM Expected Outcome:  Monitor, Self-Manage and Reduce Symptoms of Diabetes       Current Barriers:  Chronic Disease Management support and education needs related to effective management of DM  Planned Interventions: Provided education to patient about basic DM disease process; Reviewed medications with patient and discussed importance of medication adherence. She is compliant with medications. Worked with the pharm D in the past for effective management of medications. Has upcoming appointment with the pharm D;        Reviewed  prescribed diet with patient heart healthy/ADA diet; Counseled on importance of regular laboratory monitoring as prescribed;        Discussed plans with patient for ongoing care management follow up and provided patient with direct contact information for care management team;      Provided patient with written educational materials related to hypo and hyperglycemia and importance of correct treatment. The patient states that her blood sugar this am was 92. She states she does not want it to get under 90. She states it is usually consistent around 125;       Reviewed scheduled/upcoming provider appointments including: 12-19-2022 at 0900 am;         call provider for findings outside established parameters;       Referral made to pharmacy team for assistance with has an appointment with the pharm D on 09-24-2022 for medication support and education and effective medication management;       Review of patient status, including review of consultants reports, relevant laboratory and other test results, and medications completed;       Advised patient to discuss changes in her DM and DM health and well being with provider;      Screening for signs and symptoms of depression related to chronic disease state;        Assessed social determinant of health barriers;         Symptom Management: Take medications as prescribed   Attend all scheduled provider appointments Call provider office for new concerns or questions  call the Suicide and Crisis Lifeline:  988 call the [place] National Suicide Prevention Lifeline: (838)819-7331 or TTY: 505-533-6813 TTY 475-081-9106) to talk to a trained counselor call 1-800-273-TALK (toll free, 24 hour hotline) go to [facility] [address] [place] [phone]) if experiencing a Mental Health or [place]  check feet daily for cuts, sores or redness trim toenails straight across wash and dry feet carefully every  day wear comfortable, cotton socks wear comfortable, well-fitting shoes  Follow Up Plan: Telephone follow up appointment with care management team member scheduled for: 11-15-2022 at 0945 am       CCM Expected Outcome:  Monitor, Self-Manage and Reduce Symptoms of: HLD       Current Barriers:  Chronic Disease Management support and education needs related to effective management of HLD Lab Results  Component Value Date   CHOL 185 08/17/2022   HDL 49 08/17/2022   LDLCALC 120 (H) 08/17/2022   TRIG 87 08/17/2022   CHOLHDL 3.8 08/17/2022     Planned Interventions: Provider established cholesterol goals reviewed; Counseled on importance of regular laboratory monitoring as prescribed; Provided HLD educational materials; Reviewed role and benefits of statin for ASCVD risk reduction. The patient cannot take statins due to her history. The patient provided a lot of essential information relevant to the patients health history; Discussed strategies to manage statin-induced myalgias; Reviewed importance of limiting foods high in cholesterol. The patient has several allergies to medications and foods. Has to be careful about the foods she eats and medications she takes. ; Screening for signs and symptoms of depression related to chronic disease state;  Assessed social determinant of health barriers;   Symptom Management: Take medications as prescribed   Attend all scheduled provider appointments Call provider office for new concerns or questions  call the Suicide and Crisis Lifeline: 988 call the [place] National Suicide Prevention Lifeline: 817-554-9609 or TTY: 916 866 7595 TTY 601-426-3680) to talk to a trained counselor call 1-800-273-TALK (toll free, 24 hour hotline) go to [facility] [address] [place] [phone]) if experiencing a Mental Health or [place]  - call for medicine refill 2 or 3 days before it runs out - take all  medications exactly as prescribed - call doctor with any symptoms you believe are related to your medicine - call doctor when you experience any new symptoms - go to all doctor appointments as scheduled  Follow Up Plan: Telephone follow up appointment with care management team member scheduled for: 11-15-2022 at 0945 am       CCM Expected Outcome:  Monitor, Self-Manage, and Reduce Symptoms of Hypertension       Current Barriers:  Chronic Disease Management support and education needs related to effective management of HTN  BP Readings from Last 3 Encounters:  09/06/22 107/66  08/17/22 136/72  07/24/22 (!) 140/78   Self reported: 107/66 with pulse: 70 and 116/70  Planned Interventions: Evaluation of current treatment plan related to hypertension self management and patient's adherence to plan as established by provider. The patient states that her blood pressures are stable at this time. She is not having any inflammatory responses going on. When her blood pressures start going up she knows she needs to be aware of her body systems.;   Provided education to patient re: stroke prevention, s/s of heart attack and stroke; Reviewed prescribed diet Heart healthy/ADA diet Reviewed medications with patient and discussed importance of compliance. The patient is compliant with medications. She has several drug allergies and  food allergies. She is very knowledgeable about what she can take and not take. Has upcoming pharm D consult for effective management of medications and disease processes;  Discussed plans with patient for ongoing care management follow up and provided patient with direct contact information for care management team; Advised patient, providing education and rationale, to monitor blood pressure daily and record, calling PCP for findings outside established parameters;  Reviewed scheduled/upcoming provider appointments including: 12-19-2022 at 0900 am Advised patient to discuss  changes in her HTN or heart healthy with provider; Provided education on prescribed diet heart healthy/ADA;  Discussed complications of poorly controlled blood pressure such as heart disease, stroke, circulatory complications, vision complications, kidney impairment, sexual dysfunction;  Screening for signs and symptoms of depression related to chronic disease state;  Assessed social determinant of health barriers;   Symptom Management: Take medications as prescribed   Attend all scheduled provider appointments Call provider office for new concerns or questions  call the Suicide and Crisis Lifeline: 988 call the [place] National Suicide Prevention Lifeline: 678-880-9163 or TTY: 901-245-2843 TTY (216)341-0494) to talk to a trained counselor call 1-800-273-TALK (toll free, 24 hour hotline) go to [facility] [address] [place] [phone]) if experiencing a [place] or [place]  check blood pressure 3 times per week write blood pressure results in a log or diary learn about high blood pressure keep a blood pressure log take blood pressure log to all doctor appointments call doctor for signs and symptoms of high blood pressure develop an action plan for high blood pressure keep all doctor appointments take medications for blood pressure exactly as prescribed report new symptoms to your doctor  Follow Up Plan: Telephone follow up appointment with care management team member scheduled for: 11-15-2022 at 0945 am          Patient verbalizes understanding of instructions and care plan provided today and agrees to view in [place]. Active MyChart status and patient understanding of how to access instructions and care plan via MyChart confirmed with patient.     Telephone follow up appointment with care management team member scheduled for:  11-15-2022 at 0945 am  Diabetes Mellitus and Nutrition, Adult When you have diabetes, or  diabetes mellitus, it is very important to have healthy eating habits because your blood sugar (glucose) levels are greatly affected by what you eat and drink. Eating healthy foods in the right amounts, at about the same times every day, can help you: Manage your blood glucose. Lower your risk of heart disease. Improve your blood pressure. Reach or maintain a healthy weight. What can affect my meal plan? Every person with diabetes is different, and each person has different needs for a meal plan. Your health care provider may recommend that you work with a dietitian to make a meal plan that is best for you. Your meal plan may vary depending on factors such as: The calories you need. The medicines you take. Your weight. Your blood glucose, blood pressure, and cholesterol levels. Your activity level. Other health conditions you have, such as heart or kidney disease. How do carbohydrates affect me? Carbohydrates, also called carbs, affect your blood glucose level more than any other type of food. Eating carbs raises the amount of glucose in your blood. It is important to know how many carbs you can safely have in each meal. This is different for every person. Your dietitian can help you calculate how many carbs you should have at  each meal and for each snack. How does alcohol affect me? Alcohol can cause a decrease in blood glucose (hypoglycemia), especially if you use insulin or take certain diabetes medicines by mouth. Hypoglycemia can be a life-threatening condition. Symptoms of hypoglycemia, such as sleepiness, dizziness, and confusion, are similar to symptoms of having too much alcohol. Do not drink alcohol if: Your health care provider tells you not to drink. You are pregnant, may be pregnant, or are planning to become pregnant. If you drink alcohol: Limit how much you have to: 0-1 drink a day for women. 0-2 drinks a day for men. Know how much alcohol is in your drink. In the [place] one  drink equals one 12 oz bottle of beer (355 mL), one 5 oz glass of wine (148 mL), or one 1 oz glass of hard liquor (44 mL). Keep yourself hydrated with water, diet soda, or unsweetened iced tea. Keep in mind that regular soda, juice, and other mixers may contain a lot of sugar and must be counted as carbs. What are tips for following this plan?  Reading food labels Start by checking the serving size on the Nutrition Facts label of packaged foods and drinks. The number of calories and the amount of carbs, fats, and other nutrients listed on the label are based on one serving of the item. Many items contain more than one serving per package. Check the total grams (g) of carbs in one serving. Check the number of grams of saturated fats and trans fats in one serving. Choose foods that have a low amount or none of these fats. Check the number of milligrams (mg) of salt (sodium) in one serving. Most people should limit total sodium intake to less than 2,300 mg per day. Always check the nutrition information of foods labeled as "low-fat" or "nonfat." These foods may be higher in added sugar or refined carbs and should be avoided. Talk to your dietitian to identify your daily goals for nutrients listed on the label. Shopping Avoid buying canned, pre-made, or processed foods. These foods tend to be high in fat, sodium, and added sugar. Shop around the outside edge of the grocery store. This is where you will most often find fresh fruits and vegetables, bulk grains, fresh meats, and fresh dairy products. Cooking Use low-heat cooking methods, such as baking, instead of high-heat cooking methods, such as deep frying. Cook using healthy oils, such as olive, canola, or sunflower oil. Avoid cooking with butter, cream, or high-fat meats. Meal planning Eat meals and snacks regularly, preferably at the same times every day. Avoid going long periods of time without eating. Eat foods that are high in fiber, such as  fresh fruits, vegetables, beans, and whole grains. Eat 4-6 oz (112-168 g) of lean protein each day, such as lean meat, chicken, fish, eggs, or tofu. One ounce (oz) (28 g) of lean protein is equal to: 1 oz (28 g) of meat, chicken, or fish. 1 egg.  cup (62 g) of tofu. Eat some foods each day that contain healthy fats, such as avocado, nuts, seeds, and fish. What foods should I eat? Fruits Berries. Apples. Oranges. Peaches. Apricots. Plums. Grapes. Mangoes. Papayas. Pomegranates. Kiwi. Cherries. Vegetables Leafy greens, including lettuce, spinach, kale, chard, collard greens, mustard greens, and cabbage. Beets. Cauliflower. Broccoli. Carrots. Green beans. Tomatoes. Peppers. Onions. Cucumbers. Brussels sprouts. Grains Whole grains, such as whole-wheat or whole-grain bread, crackers, tortillas, cereal, and pasta. Unsweetened oatmeal. Quinoa. Brown or wild rice. Meats and other proteins Seafood.  Poultry without skin. Lean cuts of poultry and beef. Tofu. Nuts. Seeds. Dairy Low-fat or fat-free dairy products such as milk, yogurt, and cheese. The items listed above may not be a complete list of foods and beverages you can eat and drink. Contact a dietitian for more information. What foods should I avoid? Fruits Fruits canned with syrup. Vegetables Canned vegetables. Frozen vegetables with butter or cream sauce. Grains Refined white flour and flour products such as bread, pasta, snack foods, and cereals. Avoid all processed foods. Meats and other proteins Fatty cuts of meat. Poultry with skin. Breaded or fried meats. Processed meat. Avoid saturated fats. Dairy Full-fat yogurt, cheese, or milk. Beverages Sweetened drinks, such as soda or iced tea. The items listed above may not be a complete list of foods and beverages you should avoid. Contact a dietitian for more information. Questions to ask a health care provider Do I need to meet with a certified diabetes care and education  specialist? Do I need to meet with a dietitian? What number can I call if I have questions? When are the best times to check my blood glucose? Where to find more information: American Diabetes Association: diabetes.org Academy of Nutrition and Dietetics: eatright.[employer] of Diabetes and Digestive and Kidney Diseases: [employer] Association of Diabetes Care & Education Specialists: diabeteseducator.org Summary It is important to have healthy eating habits because your blood sugar (glucose) levels are greatly affected by what you eat and drink. It is important to use alcohol carefully. A healthy meal plan will help you manage your blood glucose and lower your risk of heart disease. Your health care provider may recommend that you work with a dietitian to make a meal plan that is best for you. This information is not intended to replace advice given to you by your health care provider. Make sure you discuss any questions you have with your health care provider. Document Revised: 01/13/2020 Document Reviewed: 01/13/2020 Elsevier Patient Education  [place] Eating a healthy diet is important for the health of your heart. A heart-healthy eating plan includes: Eating less unhealthy fats. Eating more healthy fats. Eating less salt in your food. Salt is also called sodium. Making other changes in your diet. Talk with your doctor or a diet specialist (dietitian) to create an eating plan that is right for you. What is my plan? Your doctor may recommend an eating plan that includes: Total fat: ______% or less of total calories a day. Saturated fat: ______% or less of total calories a day. Cholesterol: less than _________mg a day. Sodium: less than _________mg a day. What are tips for following this plan? Cooking Avoid frying your food. Try to bake, boil, grill, or broil it instead. You can also reduce fat by: Removing the skin from  poultry. Removing all visible fats from meats. Steaming vegetables in water or broth. Meal planning  At meals, divide your plate into four equal parts: Fill one-half of your plate with vegetables and green salads. Fill one-fourth of your plate with whole grains. Fill one-fourth of your plate with lean protein foods. Eat 2-4 cups of vegetables per day. One cup of vegetables is: 1 cup (91 g) broccoli or cauliflower florets. 2 medium carrots. 1 large bell pepper. 1 large sweet potato. 1 large tomato. 1 medium white potato. 2 cups (150 g) raw leafy greens. Eat 1-2 cups of fruit per day. One cup of fruit is: 1 small apple 1 large banana 1 cup (237 g)  mixed fruit, 1 large orange,  cup (82 g) dried fruit, 1 cup (240 mL) 100% fruit juice. Eat more foods that have soluble fiber. These are apples, broccoli, carrots, beans, peas, and barley. Try to get 20-30 g of fiber per day. Eat 4-5 servings of nuts, legumes, and seeds per week: 1 serving of dried beans or legumes equals  cup (90 g) cooked. 1 serving of nuts is  oz (12 almonds, 24 pistachios, or 7 walnut halves). 1 serving of seeds equals  oz (8 g). General information Eat more home-cooked food. Eat less restaurant, buffet, and fast food. Limit or avoid alcohol. Limit foods that are high in starch and sugar. Avoid fried foods. Lose weight if you are overweight. Keep track of how much salt (sodium) you eat. This is important if you have high blood pressure. Ask your doctor to tell you more about this. Try to add vegetarian meals each week. Fats Choose healthy fats. These include olive oil and canola oil, flaxseeds, walnuts, almonds, and seeds. Eat more omega-3 fats. These include salmon, mackerel, sardines, tuna, flaxseed oil, and ground flaxseeds. Try to eat fish at least 2 times each week. Check food labels. Avoid foods with trans fats or high amounts of saturated fat. Limit saturated fats. These are often found in animal  products, such as meats, butter, and cream. These are also found in plant foods, such as palm oil, palm kernel oil, and coconut oil. Avoid foods with partially hydrogenated oils in them. These have trans fats. Examples are stick margarine, some tub margarines, cookies, crackers, and other baked goods. What foods should I eat? Fruits All fresh, canned (in natural juice), or frozen fruits. Vegetables Fresh or frozen vegetables (raw, steamed, roasted, or grilled). Green salads. Grains Most grains. Choose whole wheat and whole grains most of the time. Rice and pasta, including brown rice and pastas made with whole wheat. Meats and other proteins Lean, well-trimmed beef, veal, pork, and lamb. Chicken and [place] without skin. All fish and shellfish. Wild duck, rabbit, pheasant, and venison. Egg whites or low-cholesterol egg substitutes. Dried beans, peas, lentils, and tofu. Seeds and most nuts. Dairy Low-fat or nonfat cheeses, including ricotta and mozzarella. Skim or 1% milk that is liquid, powdered, or evaporated. Buttermilk that is made with low-fat milk. Nonfat or low-fat yogurt. Fats and oils Non-hydrogenated (trans-free) margarines. Vegetable oils, including soybean, sesame, sunflower, olive, peanut, safflower, corn, canola, and cottonseed. Salad dressings or mayonnaise made with a vegetable oil. Beverages Mineral water. Coffee and tea. Diet carbonated beverages. Sweets and desserts Sherbet, gelatin, and fruit ice. Small amounts of dark chocolate. Limit all sweets and desserts. Seasonings and condiments All seasonings and condiments. The items listed above may not be a complete list of foods and drinks you can eat. Contact a dietitian for more options. What foods should I avoid? Fruits Canned fruit in heavy syrup. Fruit in cream or butter sauce. Fried fruit. Limit coconut. Vegetables Vegetables cooked in cheese, cream, or butter sauce. Fried vegetables. Grains Breads that are made with  saturated or trans fats, oils, or whole milk. Croissants. Sweet rolls. Donuts. High-fat crackers, such as cheese crackers. Meats and other proteins Fatty meats, such as hot dogs, ribs, sausage, bacon, rib-eye roast or steak. High-fat deli meats, such as salami and bologna. Caviar. Domestic duck and goose. Organ meats, such as liver. Dairy Cream, sour cream, cream cheese, and creamed cottage cheese. Whole-milk cheeses. Whole or 2% milk that is liquid, evaporated, or condensed. Whole buttermilk. Cream sauce  or high-fat cheese sauce. Yogurt that is made from whole milk. Fats and oils Meat fat, or shortening. Cocoa butter, hydrogenated oils, palm oil, coconut oil, palm kernel oil. Solid fats and shortenings, including bacon fat, salt pork, lard, and butter. Nondairy cream substitutes. Salad dressings with cheese or sour cream. Beverages Regular sodas and juice drinks with added sugar. Sweets and desserts Frosting. Pudding. Cookies. Cakes. Pies. Milk chocolate or white chocolate. Buttered syrups. Full-fat ice cream or ice cream drinks. The items listed above may not be a complete list of foods and drinks to avoid. Contact a dietitian for more information. Summary Heart-healthy meal planning includes eating less unhealthy fats, eating more healthy fats, and making other changes in your diet. Eat a balanced diet. This includes fruits and vegetables, low-fat or nonfat dairy, lean protein, nuts and legumes, whole grains, and heart-healthy oils and fats. This information is not intended to replace advice given to you by your health care provider. Make sure you discuss any questions you have with your health care provider. Document Revised: 07/17/2021 Document Reviewed: 07/17/2021 Elsevier Patient Education  [place].

## 2022-09-06 NOTE — Plan of Care (Signed)
Chronic Care Management Provider Comprehensive Care Plan    09/06/2022 Name: Suzanne Hardin MRN: [id number] DOB: February 26, 1956  Referral to Chronic Care Management (CCM) services was placed by Provider:  Dr. Rochel Brome on Date: 08-20-2022.  Chronic Condition 1: DM Provider Assessment and Plan Comorbidities: hyperlipidemia and hypertension. Control: Fair Recommend check sugars fasting daily. Recommend check feet daily. Recommend annual eye exams. Medicines: Dulaglutide1.5 mg two times per week, Toujeo 20-25 units Twice a day. Continue to work on eating a healthy diet and exercise.  Labs drawn today.      Orders: -     Hemoglobin A1c -     Microalbumin / creatinine urine ratio   Expected Outcome/Goals Addressed This Visit (Provider CCM goals/Provider Assessment and plan   CCM (DIABETES) EXPECTED OUTCOME: MONITOR, SELF-MANAGE AND REDUCE SYMPTOMS OF DIABETES   Symptom Management Condition 1: Take all medications as prescribed Attend all scheduled provider appointments Call provider office for new concerns or questions  call the Suicide and Crisis Lifeline: 988 call the [place] National Suicide Prevention Lifeline: 815-843-8455 or TTY: 360-871-5261 [place] [phone]) to talk to a trained counselor call 1-800-273-TALK (toll free, 24 hour hotline) go to [facility] [address] [place] [phone]) if experiencing a Mental Health or [place]  check feet daily for cuts, sores or redness trim toenails straight across keep feet up while sitting wash and dry feet carefully every day wear comfortable, cotton socks  Chronic Condition 2: HLD Provider Assessment and Plan Well controlled.  No medicines.  Continue to work on eating a healthy diet and exercise.  Labs drawn today.     Orders: -     Lipid panel     Expected Outcome/Goals Addressed This Visit (Provider CCM goals/Provider Assessment and plan   CCM (HLD)   EXPECTED OUTCOME:  MONITOR, SELF- MANAGE AND REDUCE SYMPTOMS OF HLD  Symptom Management Condition 2: Take all medications as prescribed Attend all scheduled provider appointments Call provider office for new concerns or questions  call the Suicide and Crisis Lifeline: 988 call the [place] National Suicide Prevention Lifeline: 919-438-3591 or TTY: (207)357-1305 TTY 718 542 3993) to talk to a trained counselor call 1-800-273-TALK (toll free, 24 hour hotline) go to [facility] [address] [place] [phone]) if experiencing a Mental Health or [place]  call for medicine refill 2 or 3 days before it runs out take all medications exactly as prescribed call doctor with any symptoms you believe are related to your medicine call doctor when you experience any new symptoms go to all doctor appointments as scheduled  Chronic Condition 3: HTN Provider Assessment and Plan Controlled off medicines. Continue to work on eating a healthy diet and exercise.  Labs drawn today.     Orders: -     Comprehensive metabolic panel -     CBC with Differential/Platelet -     Cardiovascular Risk Assessment   Expected Outcome/Goals Addressed This Visit (Provider CCM goals/Provider Assessment and plan   CCM (HYPERTENSION)  EXPECTED OUTCOME:  MONITOR,SELF- MANAGE AND REDUCE SYMPTOMS OF HYPERTENSION   Symptom Management Condition 3: Take all medications as prescribed Attend all scheduled provider appointments Call provider office for new concerns or questions  call the Suicide and Crisis Lifeline: 988 call the [place] National Suicide Prevention Lifeline: 385-128-1364 or TTY: 430-351-7180 TTY [phone]) to talk to a trained counselor call 1-800-273-TALK (toll free, 24 hour hotline) go to [facility] [address], [place] [phone]) if experiencing a Mental Health or [place]  check  blood pressure 3 times per week write blood pressure results in a log or diary learn about high blood pressure keep a blood pressure log take blood pressure log to all doctor appointments call doctor for signs and symptoms of high blood pressure keep all doctor appointments take medications for blood pressure exactly as prescribed begin an exercise program report new symptoms to your doctor  Problem List Patient Active Problem List   Diagnosis Date Noted   Statin myopathy 01/03/2022   Mixed hyperlipidemia 04/21/2021   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   Essential hypertension 09/16/2019   Type 2 diabetes mellitus with other specified complication ([place]) [id number]   Moderate persistent asthma 09/16/2019   Vitamin D deficiency 10/16/2012    Medication Management  Current Outpatient Medications:    Alpha-Lipoic Acid 200 MG CAPS, Take by mouth., Disp: , Rfl:    baclofen (LIORESAL) 10 MG tablet, Take 10 mg by mouth 3 (three) times daily., Disp: , Rfl:    Blood Glucose Calibration (ONETOUCH VERIO) SOLN, 1 each by In Vitro route every 30 (thirty) days., Disp: 1 each, Rfl: 2   Blood Glucose Monitoring Suppl (ONETOUCH VERIO) w/Device KIT, 1 each by Does not apply route 3 (three) times daily., Disp: 1 kit, Rfl: 0   Cholecalciferol (VITAMIN D) 50 MCG (2000 UT) tablet, Take 2,000 Units by mouth in the morning and at bedtime., Disp: , Rfl:    CONTOUR NEXT TEST test strip, USE AS INSTRUCTED, Disp: 100 strip, Rfl: 6   Cyanocobalamin 1000 MCG TBCR, Take 1 tablet by mouth daily., Disp: , Rfl:    D-Mannose POWD, Take by mouth., Disp: , Rfl:    doxycycline (VIBRA-TABS) 100 MG tablet, Take 1 tablet (100 mg total) by mouth 2 (two) times daily., Disp: 20 tablet, Rfl: 0   Dulaglutide 1.5 MG/0.5ML SOPN, Inject 1.5 mg into the skin 2 (two) times a week., Disp: , Rfl:    hydrOXYzine (ATARAX) 10 MG/5ML syrup, Take by mouth., Disp: , Rfl:    Lancets MISC, by Does not apply route., Disp: , Rfl:    NOVOLOG  FLEXPEN 100 UNIT/ML FlexPen, Patient uses slidding scale. Maximum 30 units uses only blood sugar >'250mg'$ /dl. Using 8-10 units when sugars are high lately., Disp: 15 mL, Rfl: 1   pyridOXINE (VITAMIN B-6) 50 MG tablet, Take 25 mg by mouth daily. Takes the p5pb6 version 25 mg daily, Disp: , Rfl:    TOUJEO SOLOSTAR 300 UNIT/ML Solostar Pen, Inject 23 Units into the skin daily. (Patient taking differently: Inject 20 Units into the skin in the morning and at bedtime. 20-25 units in the morning and 20-25 in the evening), Disp: 6 pen, Rfl: 6   TRIAMCINOLONE ACETONIDE, TOP, 0.05 % OINT, Apply topically daily as needed. Uses as needed for rash, bug bites or on gums before dentist., Disp: , Rfl:    Zinc 25 MG TABS, Take by mouth., Disp: , Rfl:   Cognitive Assessment Identity Confirmed: : Name; DOB Cognitive Status: Normal   Functional Assessment Hearing Difficulty or Deaf: no Wear Glasses or Blind: no (every February has eye exam) Concentrating, Remembering or Making Decisions Difficulty (CP): yes Concentration Management: cannot overload her, has cranial dystonia, time and spacial orientation, essential tremors Difficulty Communicating: no Difficulty Eating/Swallowing: no Walking or Climbing Stairs Difficulty: no Dressing/Bathing Difficulty: no Doing Errands Independently Difficulty (such as shopping) (CP): no   Caregiver Assessment  Primary Source of Support/Comfort: sibling(s)  Name of Support/Comfort Primary Source: Early Osmond, sister People in Home: -- (son Martie Round who is 7 years old lives with her) Family Caregiver if Needed: none Primary Roles/Responsibilities: caregiver for other(s)   Planned Interventions  Evaluation of current treatment plan related to hypertension self management and patient's adherence to plan as established by provider. The patient states that her blood pressures are stable at this time. She is not having any inflammatory responses going on. When her blood pressures  start going up she knows she needs to be aware of her body systems.;   Provided education to patient re: stroke prevention, s/s of heart attack and stroke; Reviewed prescribed diet Heart healthy/ADA diet Reviewed medications with patient and discussed importance of compliance. The patient is compliant with medications. She has several drug allergies and food allergies. She is very knowledgeable about what she can take and not take. Has upcoming pharm D consult for effective management of medications and disease processes;  Discussed plans with patient for ongoing care management follow up and provided patient with direct contact information for care management team; Advised patient, providing education and rationale, to monitor blood pressure daily and record, calling PCP for findings outside established parameters;  Reviewed scheduled/upcoming provider appointments including: 12-19-2022 at 0900 am Advised patient to discuss changes in her HTN or heart healthy with provider; Provided education on prescribed diet heart healthy/ADA;  Discussed complications of poorly controlled blood pressure such as heart disease, stroke, circulatory complications, vision complications, kidney impairment, sexual dysfunction;  Screening for signs and symptoms of depression related to chronic disease state;  Assessed social determinant of health barriers;  Provided education to patient about basic DM disease process; Reviewed medications with patient and discussed importance of medication adherence. She is compliant with medications. Worked with the pharm D in the past for effective management of medications. Has upcoming appointment with the pharm D;        Reviewed prescribed diet with patient heart healthy/ADA diet; Counseled on importance of regular laboratory monitoring as prescribed;        Discussed plans with patient for ongoing care management follow up and provided patient with direct contact information for  care management team;      Provided patient with written educational materials related to hypo and hyperglycemia and importance of correct treatment. The patient states that her blood sugar this am was 92. She states she does not want it to get under 90. She states it is usually consistent around 125;       Reviewed scheduled/upcoming provider appointments including: 12-19-2022 at 0900 am;         call provider for findings outside established parameters;       Referral made to pharmacy team for assistance with has an appointment with the pharm D on 09-24-2022 for medication support and education and effective medication management;       Review of patient status, including review of consultants reports, relevant laboratory and other test results, and medications completed;       Advised patient to discuss changes in her DM and DM health and well being with provider;      Screening for signs and symptoms of depression related to chronic disease state;        Assessed social determinant of health barriers; Provider established cholesterol goals reviewed; Counseled on importance of regular laboratory monitoring as prescribed; Provided HLD educational materials; Reviewed role and benefits of statin for ASCVD risk reduction. The patient cannot take statins due to  her history. The patient provided a lot of essential information relevant to the patients health history; Discussed strategies to manage statin-induced myalgias; Reviewed importance of limiting foods high in cholesterol. The patient has several allergies to medications and foods. Has to be careful about the foods she eats and medications she takes. ; Screening for signs and symptoms of depression related to chronic disease state;  Assessed social determinant of health barriers;       Interaction and coordination with outside resources, practitioners, and providers See CCM Referral  Care Plan: Available in MyChart

## 2022-09-12 ENCOUNTER — Telehealth: Payer: Self-pay | Admitting: Legal Medicine

## 2022-09-12 NOTE — Telephone Encounter (Signed)
PT CALLED THIS MORNING ASKING TO MAKE AN APPT FOR A SINUS INFECTION AND AN ON GOING YEAST INFECTION. I STATED THERE WAS NO OPENINGS UNTIL FRIDAY. PT STATED SHE WOULD GO TO URGENT CARE.

## 2022-09-23 DIAGNOSIS — E1169 Type 2 diabetes mellitus with other specified complication: Secondary | ICD-10-CM

## 2022-09-23 DIAGNOSIS — Z794 Long term (current) use of insulin: Secondary | ICD-10-CM

## 2022-09-23 DIAGNOSIS — I10 Essential (primary) hypertension: Secondary | ICD-10-CM

## 2022-09-23 DIAGNOSIS — E782 Mixed hyperlipidemia: Secondary | ICD-10-CM

## 2022-09-24 ENCOUNTER — Other Ambulatory Visit: Payer: Self-pay | Admitting: Family Medicine

## 2022-09-24 ENCOUNTER — Other Ambulatory Visit: Payer: Medicare Other | Admitting: Pharmacist

## 2022-09-24 MED ORDER — FREESTYLE LIBRE 3 SENSOR MISC: 11 refills | Status: DC

## 2022-09-24 NOTE — Progress Notes (Signed)
09/24/2022 Name: Suzanne Hardin MRN: [id number] DOB: 1956/02/12  Chief Complaint  Patient presents with   Medication Management   Hypertension   Diabetes   Hyperlipidemia    Suzanne Hardin is a 67 y.o. year old female who presented for a telephone visit.   They were referred to the pharmacist by their PCP for assistance in managing diabetes, hypertension, and hyperlipidemia.   Subjective:  Care Team: Primary Care Provider: Rochel Brome ; Next Scheduled Visit: 12/19/22  Medication Access/Adherence  Current Pharmacy:  [facility] [place], [place] - [address] [place] [address] Phone: [phone] Fax: [phone]  [id number] - [place], [place] [address]  [address] Phone: [phone] Fax: [phone]   Patient reports affordability concerns with their medications: No  Patient reports access/transportation concerns to their pharmacy: No  Patient reports adherence concerns with their medications:  No    Significant history of medication allergies/intolerances, intolerances to both active ingredients and excipients. Patient does research on medication ingredients prior to trying. Concerns today include renoprotection.   Diabetes:  Current medications: Trulicity 1.5 mg twice weekly - reports that taking on Mon/Thur better manages her sugars than 3 mg weekly; Toujeo 20-25 units twice daily based on glucose readings; Novolog PRN - but has not needed recently.  Prior medications: metformin, pioglitazone,   We reviewed SGLT2. Unfortunately, patient is worried about the presence of polyethylene glycol given prior allergies.   Reports her personal A1c goal is ~7.2%; reports neurologic symptoms if sugars drop to the 90s  No prior history of CGM.   Hyperlipidemia/ASCVD Risk Reduction  Current lipid lowering medications: none  PREVENT Risk  Score: 10 year risk of CVD: 16.8% - 10 year risk of ASCVD: 9.8% - 10 year risk of HF: 9.6%   Objective:  Lab Results  Component Value Date   HGBA1C 8.0 (H) 08/17/2022    Lab Results  Component Value Date   CREATININE 0.93 08/17/2022   BUN 9 08/17/2022   NA 142 08/17/2022   K 3.9 08/17/2022   CL 101 08/17/2022   CO2 24 08/17/2022    Lab Results  Component Value Date   CHOL 185 08/17/2022   HDL 49 08/17/2022   LDLCALC 120 (H) 08/17/2022   TRIG 87 08/17/2022   CHOLHDL 3.8 08/17/2022    Medications Reviewed Today     Reviewed by Osker Mason, RPH-CPP (Pharmacist) on 09/24/22 at [facility] List Status: <None>   Medication Order Taking? Sig Documenting Provider Last Dose Status Informant  Alpha-Lipoic Acid 300 MG CAPS [id number] Yes Take 1 capsule by mouth daily. [provider] Taking Active   baclofen (LIORESAL) 10 MG tablet [id number] Yes Take 10 mg by mouth 3 (three) times daily as needed. [provider] Taking Active   Blood Glucose Calibration (ONETOUCH VERIO) SOLN [id number] Yes 1 each by In Vitro route every 30 (thirty) days. Lillard Anes, MD Taking Active   Blood Glucose Monitoring Suppl [facility] VERIO) w/Device Drucie Opitz [id number] Yes 1 each by Does not apply route 3 (three) times daily. Lillard Anes, MD Taking Active   chlorpheniramine (CHLOR-TRIMETON) 4 MG tablet [id number] Yes Take 1 mg by mouth daily. [provider] Taking Active   Cholecalciferol (VITAMIN D) 125 MCG (5000 UT) CAPS [id number] Yes Take 5,000 Units by mouth in the morning and at bedtime. [provider] Taking  Active   CONTOUR NEXT TEST test strip [id number] Yes USE AS INSTRUCTED Lillard Anes, MD Taking Active   Cyanocobalamin 1000 MCG TBCR [id number] Yes Take 1 tablet by mouth every other day. [provider] Taking Active   D-Mannose POWD [id number] Yes Take by mouth as needed. [provider] Taking Active    Dulaglutide 1.5 MG/0.5ML SOPN [id number] Yes Inject 1.5 mg into the skin 2 (two) times a week. [provider] Taking Active            Med Note Jodi Mourning, Toney Reil Sep 24, 2022  9:14 AM) Taking Mondays and Thursdays  hydrOXYzine (ATARAX) 10 MG/5ML syrup [id number] No Take by mouth.  Patient not taking: Reported on 09/24/2022   [provider] Not Taking Active   Lancets [place] [id number] Yes by Does not apply route. [provider] Taking Active   NOVOLOG FLEXPEN 100 UNIT/ML FlexPen [id number] Yes Patient uses slidding scale. Maximum 30 units uses only blood sugar >250mg /dl. Using 8-10 units when sugars are high lately.  Patient taking differently: Patient uses sliding scale. Maximum 30 units uses only blood sugar >250mg /dl. Using 8-10 units when sugars are high lately.   Lillard Anes, MD Taking Active   pyridOXINE (VITAMIN B-6) 50 MG tablet [id number] Yes Take 50 mg by mouth every other day. Takes the p5pb6 version [provider] Taking Active   TOUJEO SOLOSTAR 300 UNIT/ML Solostar Pen [id number] Yes Inject 23 Units into the skin daily.  Patient taking differently: Inject 20 Units into the skin in the morning and at bedtime. 20-25 units in the morning and 20-25 in the evening   Lillard Anes, MD Taking Active   TRIAMCINOLONE ACETONIDE, TOP, 0.05 % OINT [id number] Yes Apply topically daily as needed. Uses as needed for rash, bug bites or on gums before dentist. [provider] Taking Active   Zinc 25 MG TABS [id number] Yes Take by mouth daily as needed. [provider] Taking Active               Assessment/Plan:   Diabetes: - Currently uncontrolled - Reviewed long term cardiovascular and renal outcomes of uncontrolled blood sugar - Reviewed goal A1c, goal fasting, and goal 2 hour post prandial glucose. Recommend goal between 7-7.5% given patient history of intolerances. - Discussed SGLT2, but patient concerned for  presence of excipients as above. Will focus on tools for improved glucose control. Discussed CGM, including use of spraying a nasal steroid prior to use of sensor to reduce topical allergic reaction. Patient interested, pending insurance coverage. Order sent to pharmacy via systemwide standing order to determine if product is covered on Part D.  - Goal to maintain  - Recommend to continue current regimen at this time.    Hyperlipidemia/ASCVD Risk Reduction: - Currently uncontrolled.  - Reviewed long term complications of uncontrolled cholesterol - Recommend to consider Coronary Artery CT Scoring to help guide need for therapy for hyperlipidemia. Patient will discuss with PCP at next visit in June  Follow Up Plan: pending CGM access  Catie Hedwig Morton, PharmD, [place], [place] [phone]

## 2022-09-24 NOTE — Patient Instructions (Addendum)
Ms. Filho,   It was great talking to you today.   1) We will work on coverage for [employer] 3 Continuous Glucose Monitor.  2) Talk to Dr. Tobie Poet about ordering a Coronary Artery CT/Calcium Score when you see her later this summer.   [email or web address]  Take care!  Catie Hedwig Morton, PharmD, [place], [facility] [phone]

## 2022-09-26 ENCOUNTER — Other Ambulatory Visit: Payer: Self-pay

## 2022-09-26 LAB — HM MAMMOGRAPHY

## 2022-09-26 MED ORDER — FREESTYLE LIBRE 3 SENSOR MISC: 11 refills | Status: DC

## 2022-09-28 ENCOUNTER — Encounter: Payer: Self-pay | Admitting: Family Medicine

## 2022-10-04 ENCOUNTER — Telehealth: Payer: Medicare Other

## 2022-10-05 ENCOUNTER — Other Ambulatory Visit: Payer: Self-pay | Admitting: Pharmacist

## 2022-10-05 ENCOUNTER — Other Ambulatory Visit: Payer: Self-pay | Admitting: Family Medicine

## 2022-10-05 NOTE — Progress Notes (Signed)
Care Coordination Call  Received voicemail that patient had concerns with CGM. Called her back.   Patient notes that she is very concerned about difference between her glucometer and her continuous glucose monitor. We discussed difference in lag time between blood glucose and interstitial tissue. She notes she has called the manufacturer and discussed her concerns, they discussed the 24 hour set up time and false lows, though she noted that the package insert said 12 hours. She notes her sugar readings have been more uncontrolled lately due to her stress/cortisol response due to worrying about how different her blood glucose readings and CGM readings are. I also attempted to discuss MARD and evidence surrounding accuracy testing in CGMs. She noted that she plans to try one more sensor but if she still has concerns she will discontinue use.   I advised her to discontinue use and go back to glucometer if she has concerns with the technology. She reiterated that she would try one more sensor and call me next week to let me know if it is still inaccurate.  Catie Eppie Gibson, PharmD, BCACP, CPP [facility] [phone]

## 2022-10-13 IMAGING — MG MM DIGITAL SCREENING BILAT W/ TOMO AND CAD
8 series · 8 of 24 positions shown · non-contrast
Comparison: Previous exam(s).

CLINICAL DATA: Screening.

EXAM:
DIGITAL SCREENING BILATERAL MAMMOGRAM WITH TOMOSYNTHESIS AND CAD
TECHNIQUE: Bilateral screening digital craniocaudal and mediolateral oblique
mammograms were obtained. Bilateral screening digital breast
tomosynthesis was performed. The images were evaluated with
computer-aided detection.

[L MLO synth-2D]
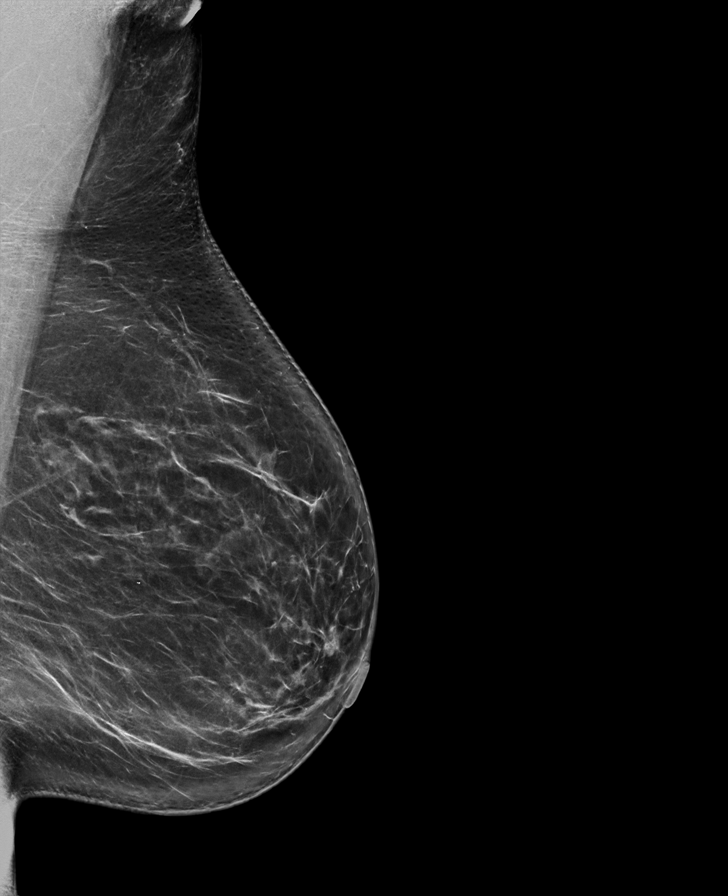

[L CC synth-2D]
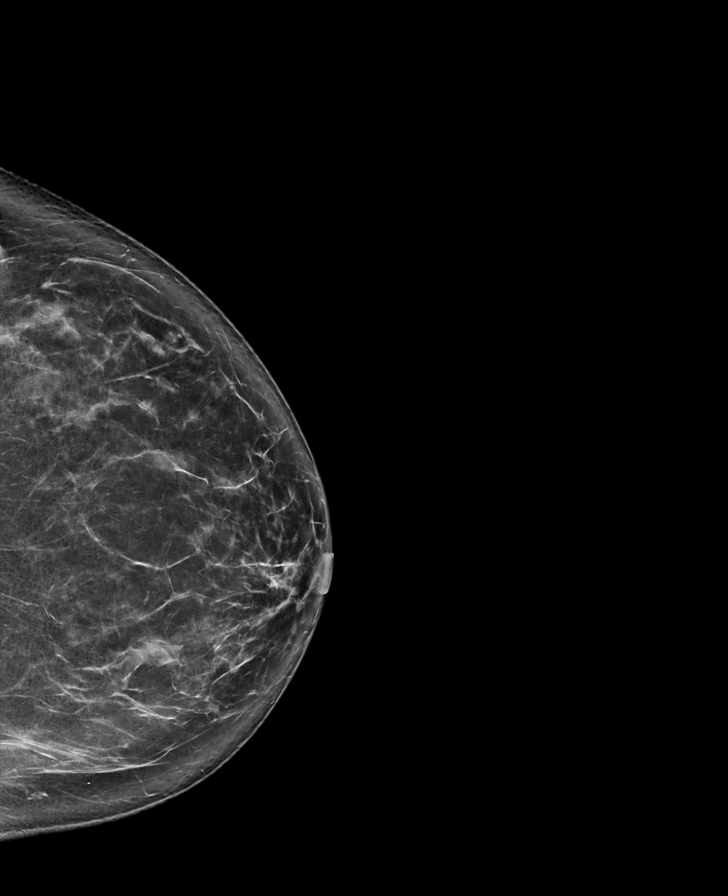

[R MLO synth-2D]
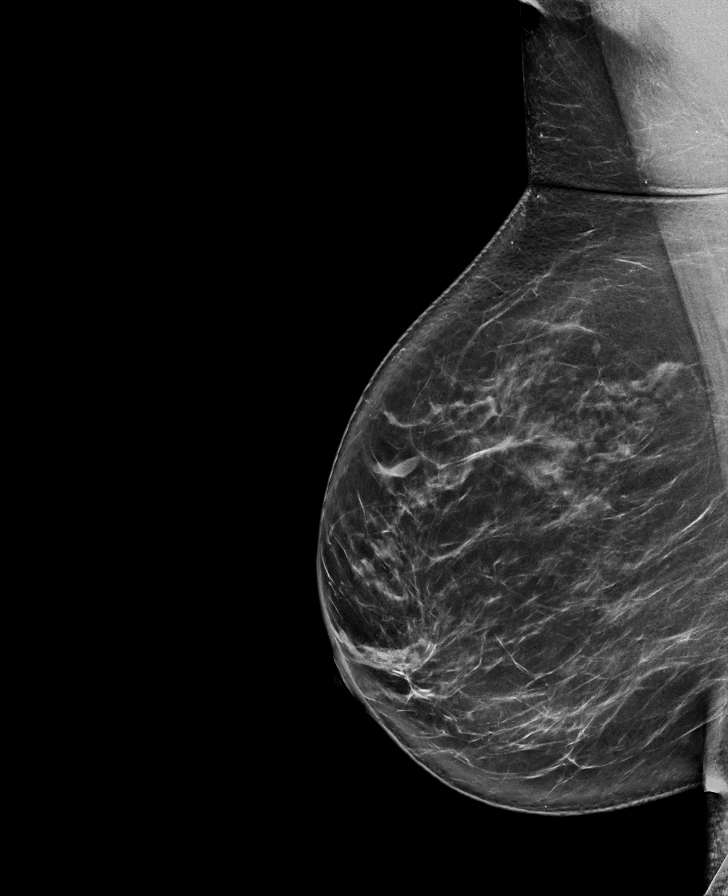

[R CC synth-2D]
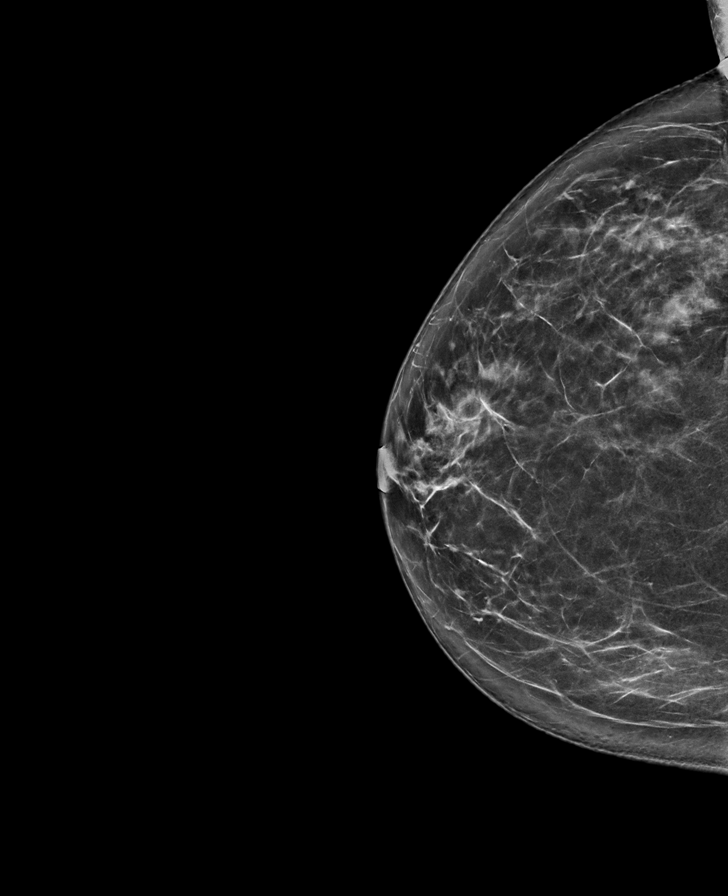

[R CC tomo · tomo slice 39/78.0]
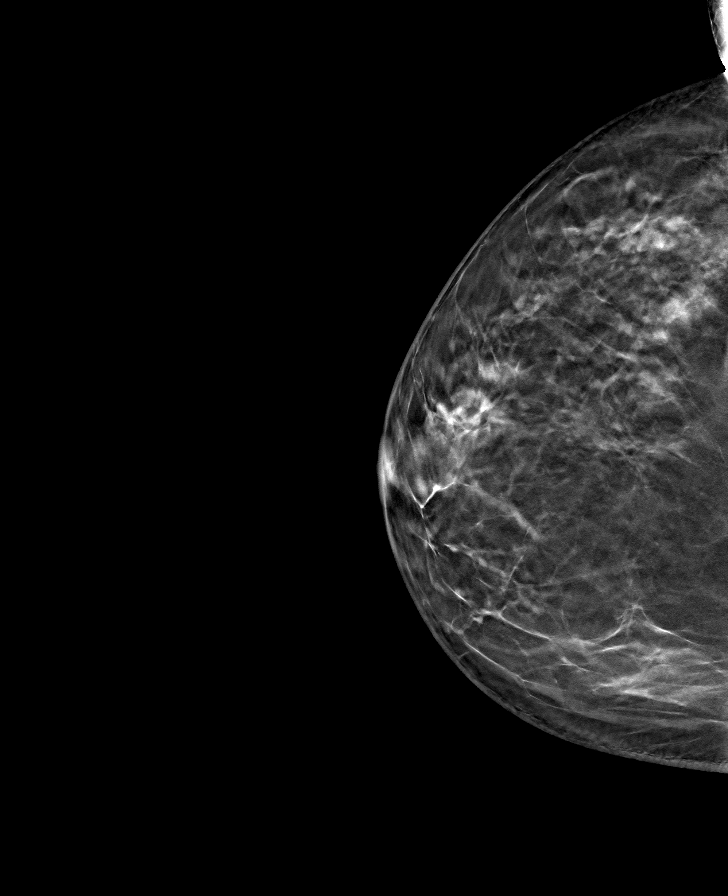

[L MLO tomo · tomo slice 45/88.0]
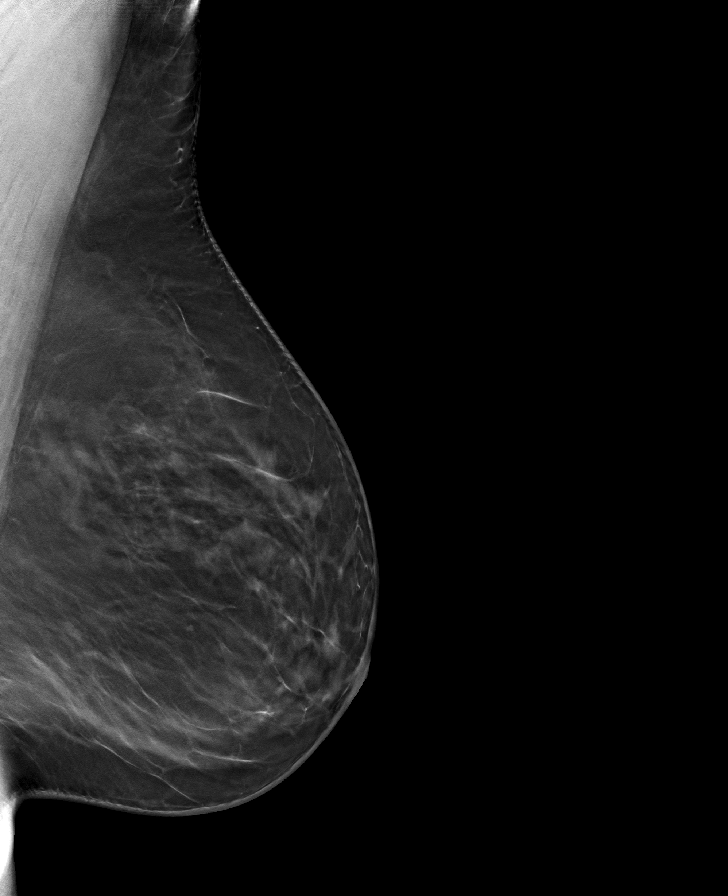

[R MLO tomo · tomo slice 45/90.0]
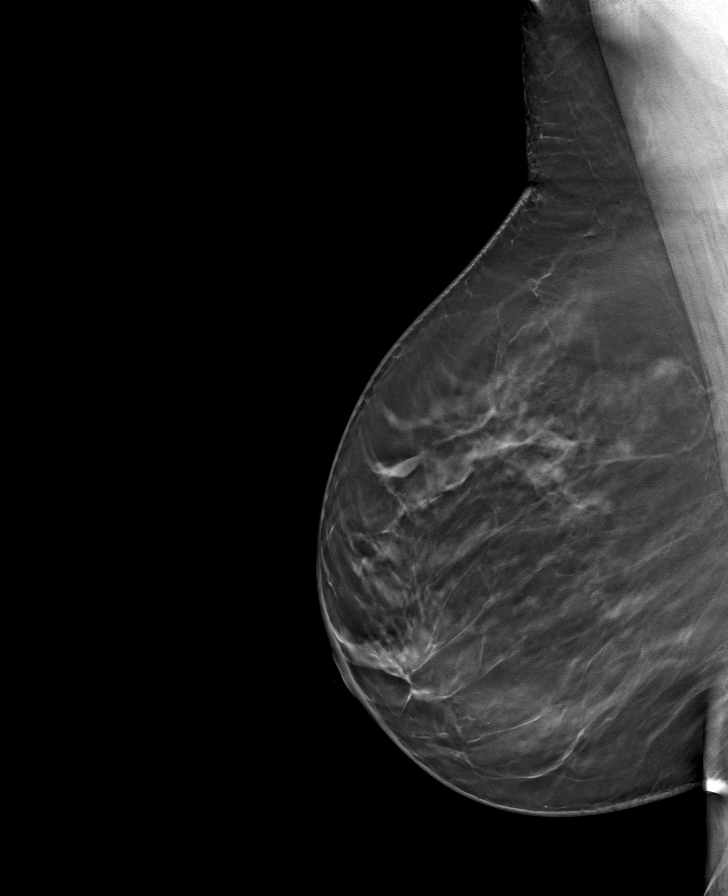

[L CC tomo · tomo slice 41/80.0]
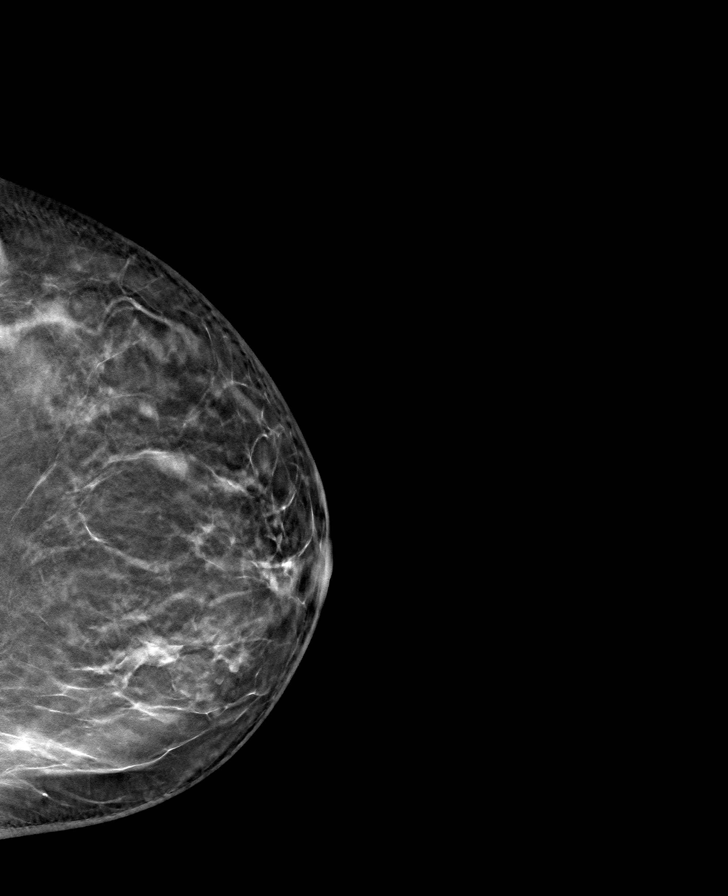

[8 of 24 positions shown; findings below may reference images not displayed]

ACR Breast Density Category b: There are scattered areas of
fibroglandular density.
FINDINGS: There are no findings suspicious for malignancy.
IMPRESSION: No mammographic evidence of malignancy. A result letter of this
screening mammogram will be mailed directly to the patient.

RECOMMENDATION:
Screening mammogram in one year. (Code:51-O-LD2)

BI-RADS CATEGORY  1: Negative.

## 2022-10-17 ENCOUNTER — Telehealth: Payer: Self-pay

## 2022-10-17 NOTE — Telephone Encounter (Signed)
PATIENT ASSISTANCE HAS BEEN RECEIVED TODAY FOR PATIENT'S TOUJEO AND PATIENT WAS CALLED AND NOTIFIED AND SHE STATED SHE WOULD COME ON MONDAY TO PICK UP.

## 2022-11-15 ENCOUNTER — Ambulatory Visit (INDEPENDENT_AMBULATORY_CARE_PROVIDER_SITE_OTHER): Payer: Medicare Other

## 2022-11-15 ENCOUNTER — Telehealth: Payer: Medicare Other

## 2022-11-15 DIAGNOSIS — I10 Essential (primary) hypertension: Secondary | ICD-10-CM

## 2022-11-15 DIAGNOSIS — E1169 Type 2 diabetes mellitus with other specified complication: Secondary | ICD-10-CM

## 2022-11-15 DIAGNOSIS — Z794 Long term (current) use of insulin: Secondary | ICD-10-CM

## 2022-11-15 DIAGNOSIS — E782 Mixed hyperlipidemia: Secondary | ICD-10-CM

## 2022-11-15 NOTE — Patient Instructions (Signed)
Please call the care guide team at [phone] if you need to cancel or reschedule your appointment.   If you are experiencing a Mental Health or Behavioral Health Crisis or need someone to talk to, please call the Suicide and Crisis Lifeline: 988 call the [place] National Suicide Prevention Lifeline: 9146109353 or TTY: 559-598-1032 TTY [phone]) to talk to a trained counselor call 1-800-273-TALK (toll free, 24 hour hotline) go to [facility] [address] [place] [phone])   Following is a copy of the CCM Program Consent:  CCM service includes personalized support from designated clinical staff supervised by the physician, including individualized plan of care and coordination with other care providers 24/7 contact phone numbers for assistance for urgent and routine care needs. Service will only be billed when office clinical staff spend 20 minutes or more in a month to coordinate care. Only one practitioner may furnish and bill the service in a calendar month. The patient may stop CCM services at amy time (effective at the end of the month) by phone call to the office staff. The patient will be responsible for cost sharing (co-pay) or up to 20% of the service fee (after annual deductible is met)  Following is a copy of your full provider care plan:   Goals Addressed             This Visit's Progress    CCM Expected Outcome:  Monitor, Self-Manage and Reduce Symptoms of Diabetes       Current Barriers:  Chronic Disease Management support and education needs related to effective management of DM Lab Results  Component Value Date   HGBA1C 8.0 (H) 08/17/2022     Planned Interventions: Provided education to patient about basic DM disease process. The patient is having a better quarter with management of her DM. She has to listen to her body and she does a good job at keeping herself in balance. ; Reviewed medications with patient and  discussed importance of medication adherence. She is compliant with medications. Worked with the pharm D in the past for effective management of medications. Continues with the pharm D;        Reviewed prescribed diet with patient heart healthy/ADA diet. Is compliant with dietary measures. She knows what foods she cannot eat and can eat; Counseled on importance of regular laboratory monitoring as prescribed. Has regular labwork. Will have new labwork in June;        Discussed plans with patient for ongoing care management follow up and provided patient with direct contact information for care management team;      Provided patient with written educational materials related to hypo and hyperglycemia and importance of correct treatment. The patient is having good numbers and is more stable. The patient states she has been staying around 102. She has had 138-145 range and up to 170. She has been testing with test strips. Does not like the continuous recorder.        Reviewed scheduled/upcoming provider appointments including: 12-19-2022 at 0900 am;         call provider for findings outside established parameters;       Referral made to pharmacy team for assistance with ongoing support and education from pharm D    Review of patient status, including review of consultants reports, relevant laboratory and other test results, and medications completed;       Advised patient to discuss changes in her DM and DM health and well being with provider;  Screening for signs and symptoms of depression related to chronic disease state;        Assessed social determinant of health barriers;         Symptom Management: Take medications as prescribed   Attend all scheduled provider appointments Call provider office for new concerns or questions  call the Suicide and Crisis Lifeline: 988 call the [place] National Suicide Prevention Lifeline: 423-446-5645 or TTY: 604-812-9122 TTY [phone]) to talk to a trained  counselor call 1-800-273-TALK (toll free, 24 hour hotline) go to [facility] [address], [place] [phone]) if experiencing a Mental Health or Behavioral Health Crisis  check feet daily for cuts, sores or redness trim toenails straight across wash and dry feet carefully every day wear comfortable, cotton socks wear comfortable, well-fitting shoes  Follow Up Plan: Telephone follow up appointment with care management team member scheduled for: 01-23-2023 at 0945 am       CCM Expected Outcome:  Monitor, Self-Manage and Reduce Symptoms of: HLD       Current Barriers:  Chronic Disease Management support and education needs related to effective management of HLD Lab Results  Component Value Date   CHOL 185 08/17/2022   HDL 49 08/17/2022   LDLCALC 120 (H) 08/17/2022   TRIG 87 08/17/2022   CHOLHDL 3.8 08/17/2022     Planned Interventions: Provider established cholesterol goals reviewed. Knows her levels and knows normals; Counseled on importance of regular laboratory monitoring as prescribed. Has labs on a regular basis; Provided HLD educational materials; Reviewed role and benefits of statin for ASCVD risk reduction. The patient cannot take statins due to her history. The patient provided a lot of essential information relevant to the patients health history; Discussed strategies to manage statin-induced myalgias; Reviewed importance of limiting foods high in cholesterol. The patient has several allergies to medications and foods. Has to be careful about the foods she eats and medications she takes. She knows her limitations well and is very proactive with her care to keep a balance ; Screening for signs and symptoms of depression related to chronic disease state;  Assessed social determinant of health barriers;   Symptom Management: Take medications as prescribed   Attend all scheduled provider appointments Call provider office for new  concerns or questions  call the Suicide and Crisis Lifeline: 988 call the [place] National Suicide Prevention Lifeline: 289-390-5666 or TTY: 986-302-5705 TTY [phone]) to talk to a trained counselor call 1-800-273-TALK (toll free, 24 hour hotline) go to [facility] [address], [place] [phone]) if experiencing a Mental Health or Behavioral Health Crisis  - call for medicine refill 2 or 3 days before it runs out - take all medications exactly as prescribed - call doctor with any symptoms you believe are related to your medicine - call doctor when you experience any new symptoms - go to all doctor appointments as scheduled  Follow Up Plan: Telephone follow up appointment with care management team member scheduled for: 01-23-2023 at 0945 am       CCM Expected Outcome:  Monitor, Self-Manage, and Reduce Symptoms of Hypertension       Current Barriers:  Chronic Disease Management support and education needs related to effective management of HTN  BP Readings from Last 3 Encounters:  09/06/22 107/66  08/17/22 136/72  07/24/22 (!) 140/78   Self reported: 110/62 with pulse: 70  Planned Interventions: Evaluation of current treatment plan related to hypertension self management and patient's adherence  to plan as established by provider. The patient states that her blood pressures are stable at this time. She is not having any inflammatory responses going on. When her blood pressures start going up she knows she needs to be aware of her body systems. She has been stable for a while and is thankful for this. Knows when to take action;   Provided education to patient re: stroke prevention, s/s of heart attack and stroke; Reviewed prescribed diet Heart healthy/ADA diet Reviewed medications with patient and discussed importance of compliance. The patient is compliant with medications. She has several drug allergies and food allergies. She is very  knowledgeable about what she can take and not take. Has upcoming pharm D consult for effective management of medications and disease processes;  Discussed plans with patient for ongoing care management follow up and provided patient with direct contact information for care management team; Advised patient, providing education and rationale, to monitor blood pressure daily and record, calling PCP for findings outside established parameters;  Reviewed scheduled/upcoming provider appointments including: 12-19-2022 at 0900 am. Advised her to write down questions to ask the provider at her upcoming visit. Education and support given.  Advised patient to discuss changes in her HTN or heart healthy with provider; Provided education on prescribed diet heart healthy/ADA;  Discussed complications of poorly controlled blood pressure such as heart disease, stroke, circulatory complications, vision complications, kidney impairment, sexual dysfunction;  Screening for signs and symptoms of depression related to chronic disease state;  Assessed social determinant of health barriers;   Symptom Management: Take medications as prescribed   Attend all scheduled provider appointments Call provider office for new concerns or questions  call the Suicide and Crisis Lifeline: 988 call the [place] National Suicide Prevention Lifeline: (825) 844-3351 or TTY: (234)047-6130 TTY (570) [phone]) to talk to a trained counselor call 1-800-273-TALK (toll free, 24 hour hotline) go to [facility] [address] [place] [phone]) if experiencing a Mental Health or Behavioral Health Crisis  check blood pressure 3 times per week write blood pressure results in a log or diary learn about high blood pressure keep a blood pressure log take blood pressure log to all doctor appointments call doctor for signs and symptoms of high blood pressure develop an action plan for high blood pressure keep  all doctor appointments take medications for blood pressure exactly as prescribed report new symptoms to your doctor  Follow Up Plan: Telephone follow up appointment with care management team member scheduled for: 01-23-2023 at 0945 am          Patient verbalizes understanding of instructions and care plan provided today and agrees to view in MyChart. Active MyChart status and patient understanding of how to access instructions and care plan via MyChart confirmed with patient.  Telephone follow up appointment with care management team member scheduled for: 01-23-2023 at 0945 am

## 2022-11-15 NOTE — Chronic Care Management (AMB) (Signed)
Chronic Care Management   CCM RN Visit Note  11/15/2022 Name: Suzanne Hardin MRN: [id number] DOB: [birth year]  Subjective: Suzanne Hardin is a 67 y.o. year old female who is a primary care patient of Abigail Miyamoto, MD (Inactive). The patient was referred to the Chronic Care Management team for assistance with care management needs subsequent to provider initiation of CCM services and plan of care.    Today's Visit:  Engaged with patient by telephone for follow up visit.        Goals Addressed             This Visit's Progress    CCM Expected Outcome:  Monitor, Self-Manage and Reduce Symptoms of Diabetes       Current Barriers:  Chronic Disease Management support and education needs related to effective management of DM Lab Results  Component Value Date   HGBA1C 8.0 (H) 08/17/2022     Planned Interventions: Provided education to patient about basic DM disease process. The patient is having a better quarter with management of her DM. She has to listen to her body and she does a good job at keeping herself in balance. ; Reviewed medications with patient and discussed importance of medication adherence. She is compliant with medications. Worked with the pharm D in the past for effective management of medications. Continues with the pharm D;        Reviewed prescribed diet with patient heart healthy/ADA diet. Is compliant with dietary measures. She knows what foods she cannot eat and can eat; Counseled on importance of regular laboratory monitoring as prescribed. Has regular labwork. Will have new labwork in June;        Discussed plans with patient for ongoing care management follow up and provided patient with direct contact information for care management team;      Provided patient with written educational materials related to hypo and hyperglycemia and importance of correct treatment. The patient is having good numbers and is more stable. The patient states she has been  staying around 102. She has had 138-145 range and up to 170. She has been testing with test strips. Does not like the continuous recorder.        Reviewed scheduled/upcoming provider appointments including: 12-19-2022 at 0900 am;         call provider for findings outside established parameters;       Referral made to pharmacy team for assistance with ongoing support and education from pharm D    Review of patient status, including review of consultants reports, relevant laboratory and other test results, and medications completed;       Advised patient to discuss changes in her DM and DM health and well being with provider;      Screening for signs and symptoms of depression related to chronic disease state;        Assessed social determinant of health barriers;         Symptom Management: Take medications as prescribed   Attend all scheduled provider appointments Call provider office for new concerns or questions  call the Suicide and Crisis Lifeline: 988 call the [place] National Suicide Prevention Lifeline: 6058437565 or TTY: 580-818-1780 TTY [phone]) to talk to a trained counselor call 1-800-273-TALK (toll free, 24 hour hotline) go to [facility] [address] [place] [phone]) if experiencing a Mental Health or Behavioral Health Crisis  check feet daily for cuts, sores or redness trim toenails straight across wash and  dry feet carefully every day wear comfortable, cotton socks wear comfortable, well-fitting shoes  Follow Up Plan: Telephone follow up appointment with care management team member scheduled for: 01-23-2023 at 0945 am       CCM Expected Outcome:  Monitor, Self-Manage and Reduce Symptoms of: HLD       Current Barriers:  Chronic Disease Management support and education needs related to effective management of HLD Lab Results  Component Value Date   CHOL 185 08/17/2022   HDL 49 08/17/2022   LDLCALC 120 (H) 08/17/2022    TRIG 87 08/17/2022   CHOLHDL 3.8 08/17/2022     Planned Interventions: Provider established cholesterol goals reviewed. Knows her levels and knows normals; Counseled on importance of regular laboratory monitoring as prescribed. Has labs on a regular basis; Provided HLD educational materials; Reviewed role and benefits of statin for ASCVD risk reduction. The patient cannot take statins due to her history. The patient provided a lot of essential information relevant to the patients health history; Discussed strategies to manage statin-induced myalgias; Reviewed importance of limiting foods high in cholesterol. The patient has several allergies to medications and foods. Has to be careful about the foods she eats and medications she takes. She knows her limitations well and is very proactive with her care to keep a balance ; Screening for signs and symptoms of depression related to chronic disease state;  Assessed social determinant of health barriers;   Symptom Management: Take medications as prescribed   Attend all scheduled provider appointments Call provider office for new concerns or questions  call the Suicide and Crisis Lifeline: 988 call the [place] National Suicide Prevention Lifeline: (936)712-7847 or TTY: 954-525-1363 TTY [phone]) to talk to a trained counselor call 1-800-273-TALK (toll free, 24 hour hotline) go to [facility] [address] [place] [phone]) if experiencing a Mental Health or Behavioral Health Crisis  - call for medicine refill 2 or 3 days before it runs out - take all medications exactly as prescribed - call doctor with any symptoms you believe are related to your medicine - call doctor when you experience any new symptoms - go to all doctor appointments as scheduled  Follow Up Plan: Telephone follow up appointment with care management team member scheduled for: 01-23-2023 at 0945 am       CCM Expected  Outcome:  Monitor, Self-Manage, and Reduce Symptoms of Hypertension       Current Barriers:  Chronic Disease Management support and education needs related to effective management of HTN  BP Readings from Last 3 Encounters:  09/06/22 107/66  08/17/22 136/72  07/24/22 (!) 140/78   Self reported: 110/62 with pulse: 70  Planned Interventions: Evaluation of current treatment plan related to hypertension self management and patient's adherence to plan as established by provider. The patient states that her blood pressures are stable at this time. She is not having any inflammatory responses going on. When her blood pressures start going up she knows she needs to be aware of her body systems. She has been stable for a while and is thankful for this. Knows when to take action;   Provided education to patient re: stroke prevention, s/s of heart attack and stroke; Reviewed prescribed diet Heart healthy/ADA diet Reviewed medications with patient and discussed importance of compliance. The patient is compliant with medications. She has several drug allergies and food allergies. She is very knowledgeable about what she can take and not take. Has upcoming pharm D consult for effective  management of medications and disease processes;  Discussed plans with patient for ongoing care management follow up and provided patient with direct contact information for care management team; Advised patient, providing education and rationale, to monitor blood pressure daily and record, calling PCP for findings outside established parameters;  Reviewed scheduled/upcoming provider appointments including: 12-19-2022 at 0900 am. Advised her to write down questions to ask the provider at her upcoming visit. Education and support given.  Advised patient to discuss changes in her HTN or heart healthy with provider; Provided education on prescribed diet heart healthy/ADA;  Discussed complications of poorly controlled blood pressure  such as heart disease, stroke, circulatory complications, vision complications, kidney impairment, sexual dysfunction;  Screening for signs and symptoms of depression related to chronic disease state;  Assessed social determinant of health barriers;   Symptom Management: Take medications as prescribed   Attend all scheduled provider appointments Call provider office for new concerns or questions  call the Suicide and Crisis Lifeline: 988 call the [place] National Suicide Prevention Lifeline: (575) 154-0687 or TTY: (413) 784-6086 TTY [phone]) to talk to a trained counselor call 1-800-273-TALK (toll free, 24 hour hotline) go to [facility] [address], [place] [phone]) if experiencing a Mental Health or Behavioral Health Crisis  check blood pressure 3 times per week write blood pressure results in a log or diary learn about high blood pressure keep a blood pressure log take blood pressure log to all doctor appointments call doctor for signs and symptoms of high blood pressure develop an action plan for high blood pressure keep all doctor appointments take medications for blood pressure exactly as prescribed report new symptoms to your doctor  Follow Up Plan: Telephone follow up appointment with care management team member scheduled for: 01-23-2023 at 0945 am          Plan:Telephone follow up appointment with care management team member scheduled for:  01-23-2023 at 0945 am   Alto Denver RN, MSN, CCM RN Care Manager  Chronic Care Management Direct Number: [phone]

## 2022-11-23 DIAGNOSIS — E785 Hyperlipidemia, unspecified: Secondary | ICD-10-CM | POA: Diagnosis not present

## 2022-11-23 DIAGNOSIS — Z794 Long term (current) use of insulin: Secondary | ICD-10-CM

## 2022-11-23 DIAGNOSIS — I10 Essential (primary) hypertension: Secondary | ICD-10-CM

## 2022-11-23 DIAGNOSIS — E1159 Type 2 diabetes mellitus with other circulatory complications: Secondary | ICD-10-CM

## 2022-12-18 NOTE — Assessment & Plan Note (Signed)
Well controlled.  No medicines.  Continue to work on eating a healthy diet and exercise.  Labs drawn today.   

## 2022-12-18 NOTE — Assessment & Plan Note (Signed)
Comorbidities: hyperlipidemia and hypertension. Control: Fair Recommend check sugars fasting daily. Recommend check feet daily. Recommend annual eye exams. Medicines: Dulaglutide1.5 mg two times per week, Toujeo 20-25 units Twice a day. Continue to work on eating a healthy diet and exercise.  Labs drawn today.    

## 2022-12-18 NOTE — Assessment & Plan Note (Addendum)
The current medical regimen is effective;  continue present plan and medications.  

## 2022-12-18 NOTE — Progress Notes (Unsigned)
Subjective:  Patient ID: Suzanne Hardin, female    DOB: 06-11-1956  Age: 67 y.o. MRN: [id number]  Chief Complaint  Patient presents with   Medical Management of Chronic Issues    HPI Diabetes:  Complications: nephropathy Glucose checking: twice daily  Glucose logs: 102-120 Hypoglycemia: no Most recent A1C: 8.0% Current medications: Dulaglutide1.5 mg two times per week, Toujeo 20-25 units Twice a day.  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   Hypertension: Home readings were running from 110/76-136/72. Patient states she has had good readings and denies headaches/chest pain/SOB. Current medications: None (Per last labs it was suggested that patient start on Losartan 25 mg)     09/06/2022   10:32 AM 08/17/2022    8:26 AM 05/15/2021   11:24 AM 02/10/2021    9:36 AM 01/15/2020    8:18 AM  Depression screen PHQ 2/9  Decreased Interest 0 0 0 0 0  Down, Depressed, Hopeless 0 0 0 0 0  PHQ - 2 Score 0 0 0 0 0        09/06/2022   10:40 AM  Fall Risk   Falls in the past year? 0  Number falls in past yr: 0  Injury with Fall? 0  Risk for fall due to : No Fall Risks  Follow up Falls evaluation completed    Patient Care Team: Blane Ohara, MD as PCP - General (Family Medicine) Marlowe Sax, RN as Case Manager (General Practice) Alden Hipp, RPH-CPP (Pharmacist)   Review of Systems  Constitutional:  Negative for chills, fatigue and fever.  HENT:  Negative for congestion, ear pain, rhinorrhea and sore throat.   Respiratory:  Negative for cough and shortness of breath.   Cardiovascular:  Negative for chest pain.  Gastrointestinal:  Negative for abdominal pain, constipation, diarrhea, nausea and vomiting.  Genitourinary:  Negative for dysuria and urgency.   Musculoskeletal:  Negative for back pain and myalgias.  Neurological:  Negative for dizziness, weakness, light-headedness and headaches.  Psychiatric/Behavioral:  Negative for dysphoric mood. The patient is not nervous/anxious.     Current Outpatient Medications on File Prior to Visit  Medication Sig Dispense Refill   Alpha-Lipoic Acid 300 MG CAPS Take 1 capsule by mouth daily.     baclofen (LIORESAL) 10 MG tablet Take 10 mg by mouth 3 (three) times daily as needed.     Blood Glucose Calibration (ONETOUCH VERIO) SOLN 1 each by In Vitro route every 30 (thirty) days. 1 each 2   Blood Glucose Monitoring Suppl (ONETOUCH VERIO) w/Device KIT 1 each by Does not apply route 3 (three) times daily. 1 kit 0   chlorpheniramine (CHLOR-TRIMETON) 4 MG tablet Take 1 mg by mouth daily.     Cholecalciferol (VITAMIN D) 125 MCG (5000 UT) CAPS Take 5,000 Units by mouth in the morning and at bedtime.     CONTOUR NEXT TEST test strip USE AS INSTRUCTED 100 strip 6   Cyanocobalamin 1000 MCG TBCR Take 1 tablet by mouth every other day.     D-Mannose POWD Take by mouth as needed.     Dulaglutide 1.5 MG/0.5ML SOPN Inject 1.5 mg into the skin 2 (two) times a week.     hydrOXYzine (ATARAX) 10 MG/5ML syrup Take by mouth.     ibuprofen (ADVIL) 200  MG tablet Take 200 mg by mouth every 6 (six) hours as needed.     Lancets MISC by Does not apply route.     NOVOLOG FLEXPEN 100 UNIT/ML FlexPen Patient uses slidding scale. Maximum 30 units uses only blood sugar >250mg /dl. Using 8-10 units when sugars are high lately. (Patient taking differently: Patient uses sliding scale. Maximum 30 units uses only blood sugar >250mg /dl. Using 8-10 units when sugars are high lately.) 15 mL 1   pyridOXINE (VITAMIN B-6) 50 MG tablet Take 50 mg by mouth every other day. Takes the p5pb6 version     TOUJEO SOLOSTAR 300 UNIT/ML Solostar Pen Inject 23 Units into the skin daily. (Patient taking differently: Inject 20 Units into the skin in the morning  and at bedtime. 20-25 units in the morning and 20-25 in the evening) 6 pen 6   TRIAMCINOLONE ACETONIDE, TOP, 0.05 % OINT Apply topically daily as needed. Uses as needed for rash, bug bites or on gums before dentist.     Zinc 25 MG TABS Take by mouth daily as needed.     No current facility-administered medications on file prior to visit.   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 Food, (peanuts, bell peppers, raw onions,pickles) Drug, and Skin, environmental, fragrances & air freshners   Type 2 diabetes mellitus with other specified complication (HCC) 09/16/2019   Past Surgical History:  Procedure Laterality Date   APPENDECTOMY  2000   CATARACT EXTRACTION, BILATERAL     CHOLECYSTECTOMY  2000   TONSILLECTOMY AND ADENOIDECTOMY      Family History  Problem Relation Age of Onset   Ulcers Father    Peripheral Artery Disease Father    Breast cancer Maternal Aunt    Breast cancer Maternal Aunt    Breast cancer Cousin    Social History   Socioeconomic History   Marital status: Divorced    Spouse name: Not on file   Number of children: 2   Years of education: Not on file   Highest education level: Not on file  Occupational History   Occupation: Disabled  Tobacco Use   Smoking status: Never   Smokeless tobacco: Never  Vaping Use   Vaping Use: Never used  Substance and Sexual Activity   Alcohol use: Never   Drug use: Never   Sexual activity: Yes    Partners: Male  Other Topics Concern   Not on file  Social History Narrative   Not on file   Social Determinants of Health   Financial  Resource Strain: High Risk (09/06/2022)   Overall Financial Resource Strain (CARDIA)    Difficulty of Paying Living Expenses: Hard  Food Insecurity: No Food Insecurity (09/06/2022)   Hunger Vital Sign    Worried About Running Out of Food in the Last Year: Never true    Ran Out of Food in the Last Year: Never true  Transportation Needs: No Transportation Needs (09/06/2022)   PRAPARE - Administrator, Civil Service (Medical): No    Lack of Transportation (Non-Medical): No  Physical Activity: Sufficiently Active (09/06/2022)   Exercise  Vital Sign    Days of Exercise per Week: 5 days    Minutes of Exercise per Session: 60 min  Stress: No Stress Concern Present (09/06/2022)   [employer] of Occupational Health - Occupational Stress Questionnaire    Feeling of Stress : Not at all  Social Connections: Moderately Integrated (09/06/2022)   Social Connection and Isolation Panel [NHANES]    Frequency of Communication with Friends and Family: More than three times a week    Frequency of Social Gatherings with Friends and Family: More than three times a week    Attends Religious Services: More than 4 times per year    Active Member of [employer] or Organizations: Yes    Attends Engineer, structural: More than 4 times per year    Marital Status: Divorced    Objective:  BP (!) 120/46 (BP Location: Left Arm) Comment: Patient's BP at home this morning, patient brought BP machine and it correlated with ours too as well.  Pulse 72   Temp (!) 97.2 F (36.2 C) (Temporal)   Ht 5\' 6"  (1.676 m)   Wt 190 lb (86.2 kg)   SpO2 98%   BMI 30.67 kg/m      12/19/2022    9:22 AM 12/19/2022    9:09 AM 09/06/2022   10:41 AM  BP/Weight  Systolic BP 120 146 107  Diastolic BP 46 72 66  Wt. (Lbs)  190   BMI  30.67 kg/m2     Physical Exam Vitals reviewed.  Constitutional:      Appearance: Normal appearance. She is normal weight.  Neck:     Vascular: No carotid bruit.  Cardiovascular:      Rate and Rhythm: Normal rate and regular rhythm.     Heart sounds: Normal heart sounds.  Pulmonary:     Effort: Pulmonary effort is normal. No respiratory distress.     Breath sounds: Normal breath sounds.  Abdominal:     General: Abdomen is flat. Bowel sounds are normal.     Palpations: Abdomen is soft.     Tenderness: There is no abdominal tenderness.  Skin:    Comments: Scabs around bilateral ankles due to bug bites.  Neurological:     Mental Status: She is alert and oriented to person, place, and time.  Psychiatric:        Mood and Affect: Mood normal.        Behavior: Behavior normal.     Diabetic Foot Exam - Simple   Simple Foot Form Diabetic Foot exam was performed with the following findings: Yes 12/19/2022  9:29 AM  Visual Inspection No deformities, no ulcerations, no other skin breakdown bilaterally: Yes Sensation Testing Intact to touch and monofilament testing bilaterally: Yes Pulse Check Posterior Tibialis and Dorsalis pulse intact bilaterally: Yes Comments      Lab Results  Component Value Date   WBC 7.5 12/19/2022   HGB 14.6 12/19/2022   HCT 43.0 12/19/2022   PLT 302 12/19/2022   GLUCOSE 128 (H) 12/19/2022   CHOL 177 12/19/2022   TRIG 62 12/19/2022   HDL 60 12/19/2022   LDLCALC 105 (H) 12/19/2022   ALT 17 12/19/2022   AST 24 12/19/2022   NA 142 12/19/2022   K 4.2 12/19/2022   CL 103 12/19/2022   CREATININE 0.90 12/19/2022   BUN 11 12/19/2022   CO2 25 12/19/2022   TSH 2.560 12/28/2020   HGBA1C 6.3 (H) 12/19/2022   MICROALBUR 80 12/28/2020  Assessment & Plan:    Diabetic glomerulopathy (HCC) Assessment & Plan: Comorbidities: hyperlipidemia and hypertension. Control: Fair Recommend check sugars fasting daily. Recommend check feet daily. Recommend annual eye exams. Medicines: Dulaglutide1.5 mg two times per week, Toujeo 20-25 units Twice a day. Continue to work on eating a healthy diet and exercise.  Labs drawn today.      Orders: -     CBC with Differential/Platelet -     Hemoglobin A1c -     Microalbumin / creatinine urine ratio  Essential hypertension Assessment & Plan: Controlled off medicines. Continue to work on eating a healthy diet and exercise.  Labs drawn today.    Orders: -     Comprehensive metabolic panel  Mixed hyperlipidemia Assessment & Plan: Well controlled.  No medicines.  Continue to work on eating a healthy diet and exercise.  Labs drawn today.    Orders: -     Lipid panel  Vitamin D deficiency Assessment & Plan: The current medical regimen is effective;  continue present plan and medications.    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       No orders of the defined types were placed in this encounter.   Orders Placed This Encounter  Procedures   CBC with Differential/Platelet   Comprehensive metabolic panel   Lipid panel   Hemoglobin A1c   Microalbumin / creatinine urine ratio     Follow-up: Return in about 3 months (around 03/21/2023) for chronic, fasting.   I,Katherina A Bramblett,acting as a scribe for Blane Ohara, MD.,have documented all relevant documentation on the behalf of Blane Ohara, MD,as directed by  Blane Ohara, MD while in the presence of Blane Ohara, MD.    Clayborn Bigness I Leal-Borjas,acting as a scribe for Blane Ohara, MD.,have documented all relevant documentation on the behalf of Blane Ohara, MD,as directed by  Blane Ohara, MD while in the presence of Blane Ohara, MD.   An After Visit Summary was printed and given to the patient.  I attest that I have reviewed this visit and agree with the plan scribed by my staff.   Blane Ohara, MD [facility] [phone]

## 2022-12-18 NOTE — Assessment & Plan Note (Signed)
Controlled off medicines.  Continue to work on eating a healthy diet and exercise.  Labs drawn today.  

## 2022-12-19 ENCOUNTER — Encounter: Payer: Self-pay | Admitting: Family Medicine

## 2022-12-19 ENCOUNTER — Ambulatory Visit (INDEPENDENT_AMBULATORY_CARE_PROVIDER_SITE_OTHER): Payer: Medicare Other | Admitting: Family Medicine

## 2022-12-19 VITALS — BP 120/46 | HR 72 | Temp 97.2°F | Ht 66.0 in | Wt 190.0 lb

## 2022-12-19 DIAGNOSIS — Z794 Long term (current) use of insulin: Secondary | ICD-10-CM

## 2022-12-19 DIAGNOSIS — I10 Essential (primary) hypertension: Secondary | ICD-10-CM | POA: Diagnosis not present

## 2022-12-19 DIAGNOSIS — E1121 Type 2 diabetes mellitus with diabetic nephropathy: Secondary | ICD-10-CM | POA: Diagnosis not present

## 2022-12-19 DIAGNOSIS — E559 Vitamin D deficiency, unspecified: Secondary | ICD-10-CM | POA: Diagnosis not present

## 2022-12-19 DIAGNOSIS — T466X5A Adverse effect of antihyperlipidemic and antiarteriosclerotic drugs, initial encounter: Secondary | ICD-10-CM

## 2022-12-19 DIAGNOSIS — E782 Mixed hyperlipidemia: Secondary | ICD-10-CM

## 2022-12-19 DIAGNOSIS — G720 Drug-induced myopathy: Secondary | ICD-10-CM

## 2022-12-20 LAB — COMPREHENSIVE METABOLIC PANEL
ALT: 17 IU/L (ref 0–32)
AST: 24 IU/L (ref 0–40)
Albumin: 4.4 g/dL (ref 3.9–4.9)
Alkaline Phosphatase: 94 IU/L (ref 44–121)
BUN/Creatinine Ratio: 12 (ref 12–28)
BUN: 11 mg/dL (ref 8–27)
Bilirubin Total: 0.4 mg/dL (ref 0.0–1.2)
CO2: 25 mmol/L (ref 20–29)
Calcium: 9.7 mg/dL (ref 8.7–10.3)
Chloride: 103 mmol/L (ref 96–106)
Creatinine, Ser: 0.9 mg/dL (ref 0.57–1.00)
Globulin, Total: 2.1 g/dL (ref 1.5–4.5)
Glucose: 128 mg/dL — ABNORMAL HIGH (ref 70–99)
Potassium: 4.2 mmol/L (ref 3.5–5.2)
Sodium: 142 mmol/L (ref 134–144)
Total Protein: 6.5 g/dL (ref 6.0–8.5)
eGFR: 71 mL/min/{1.73_m2} (ref >59)

## 2022-12-20 LAB — CBC WITH DIFFERENTIAL/PLATELET
Basophils Absolute: 0 10*3/uL (ref 0.0–0.2)
Basos: 1 %
EOS (ABSOLUTE): 0.2 10*3/uL (ref 0.0–0.4)
Eos: 3 %
Hematocrit: 43 % (ref 34.0–46.6)
Hemoglobin: 14.6 g/dL (ref 11.1–15.9)
Immature Grans (Abs): 0 10*3/uL (ref 0.0–0.1)
Immature Granulocytes: 0 %
Lymphocytes Absolute: 3 10*3/uL (ref 0.7–3.1)
Lymphs: 41 %
MCH: 30.5 pg (ref 26.6–33.0)
MCHC: 34 g/dL (ref 31.5–35.7)
MCV: 90 fL (ref 79–97)
Monocytes Absolute: 0.3 10*3/uL (ref 0.1–0.9)
Monocytes: 4 %
Neutrophils Absolute: 3.9 10*3/uL (ref 1.4–7.0)
Neutrophils: 51 %
Platelets: 302 10*3/uL (ref 150–450)
RBC: 4.78 x10E6/uL (ref 3.77–5.28)
RDW: 12.9 % (ref 11.7–15.4)
WBC: 7.5 10*3/uL (ref 3.4–10.8)

## 2022-12-20 LAB — MICROALBUMIN / CREATININE URINE RATIO
Creatinine, Urine: 147.3 mg/dL
Microalb/Creat Ratio: 72 mg/g creat — ABNORMAL HIGH (ref 0–29)
Microalbumin, Urine: 106 ug/mL

## 2022-12-20 LAB — HEMOGLOBIN A1C
Est. average glucose Bld gHb Est-mCnc: 134 mg/dL
Hgb A1c MFr Bld: 6.3 % — ABNORMAL HIGH (ref 4.8–5.6)

## 2022-12-20 LAB — LIPID PANEL
Chol/HDL Ratio: 3 ratio (ref 0.0–4.4)
Cholesterol, Total: 177 mg/dL (ref 100–199)
HDL: 60 mg/dL (ref >39)
LDL Chol Calc (NIH): 105 mg/dL — ABNORMAL HIGH (ref 0–99)
Triglycerides: 62 mg/dL (ref 0–149)
VLDL Cholesterol Cal: 12 mg/dL (ref 5–40)

## 2022-12-22 NOTE — Assessment & Plan Note (Signed)
Intolerant to statins. 

## 2023-01-03 IMAGING — US US RENAL
1 series · 14 of 25 positions shown · non-contrast
Comparison: None Available.

CLINICAL DATA: Microscopic hematuria

EXAM:
RENAL / URINARY TRACT ULTRASOUND COMPLETE

[Series 1: us renal · 0.23mm/px · 14 of 30 slices shown]
[im 1/30]
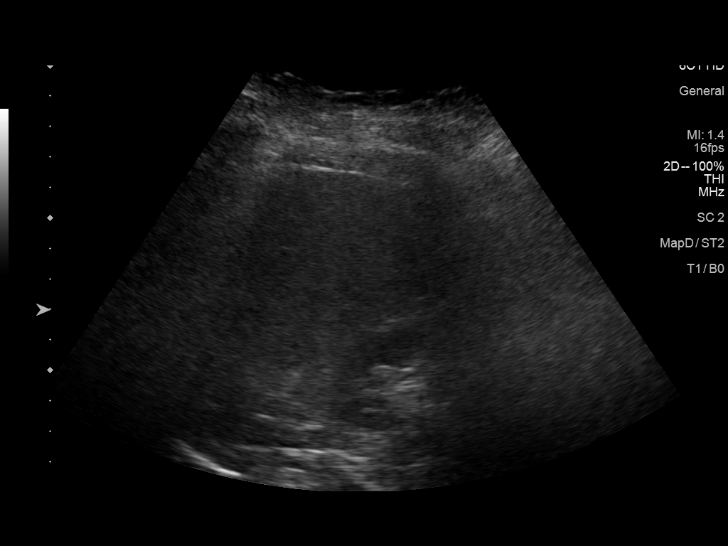
[im 3/30]
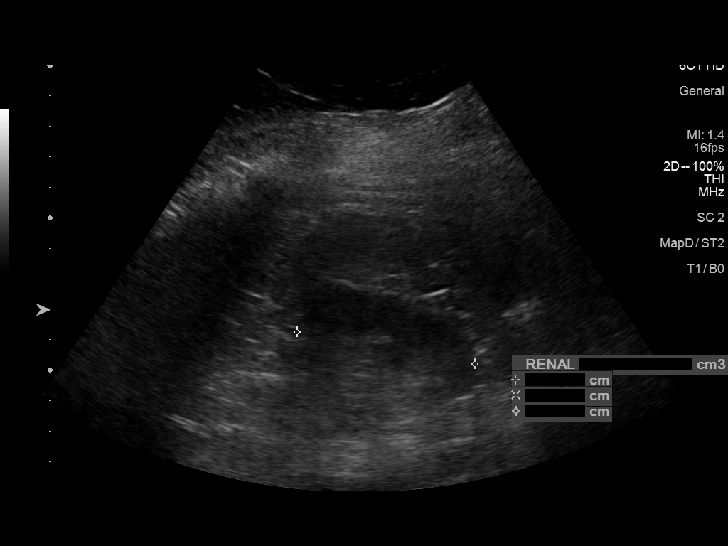
[im 5/30]
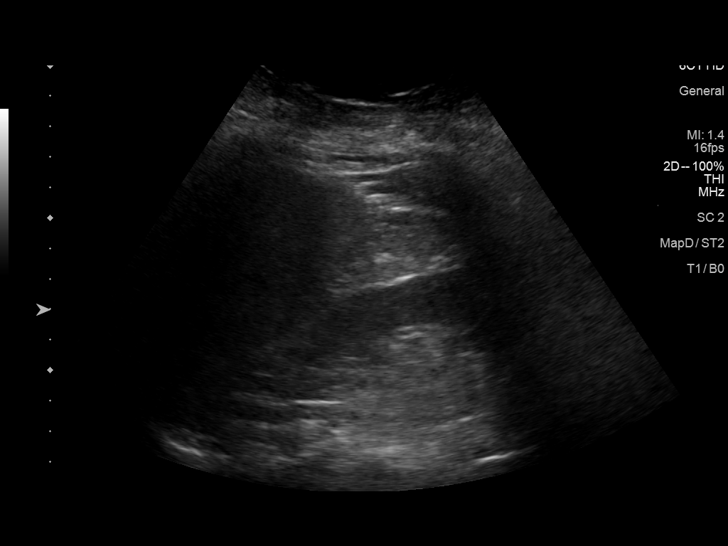
[im 8/30]
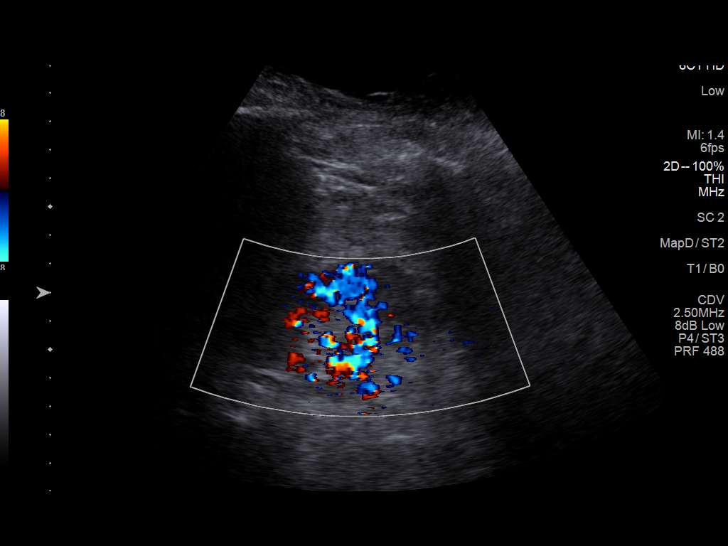
[im 10/30]
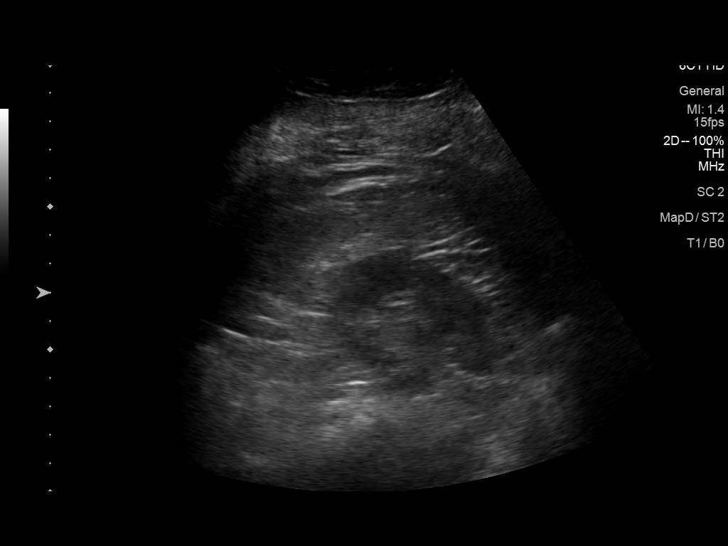
[im 11/30]
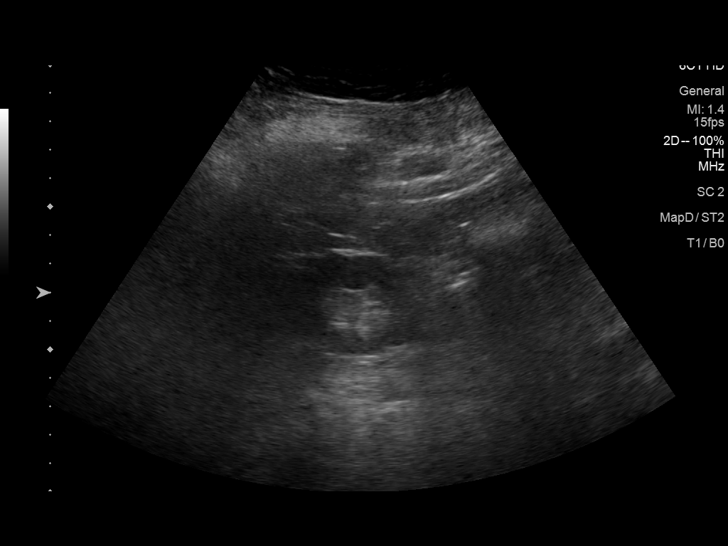
[im 14/30]
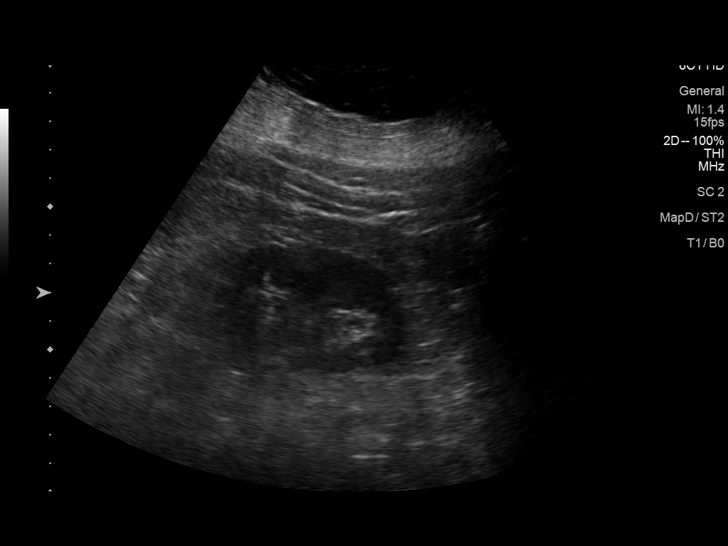
[im 16/30]
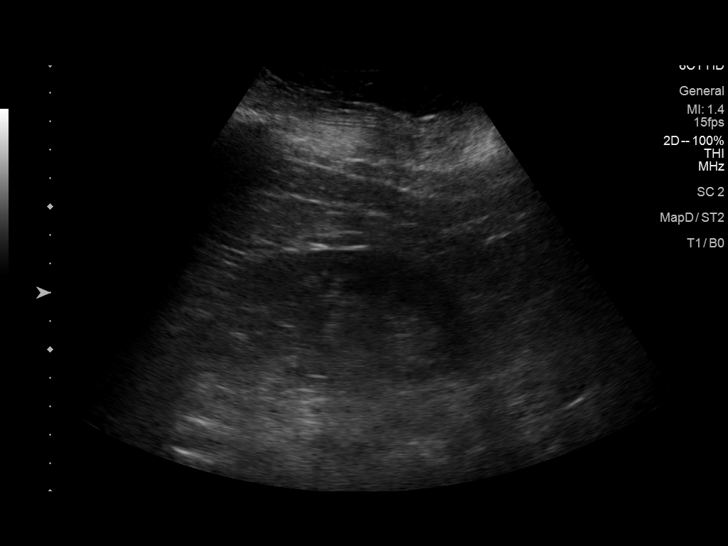
[im 19/30]
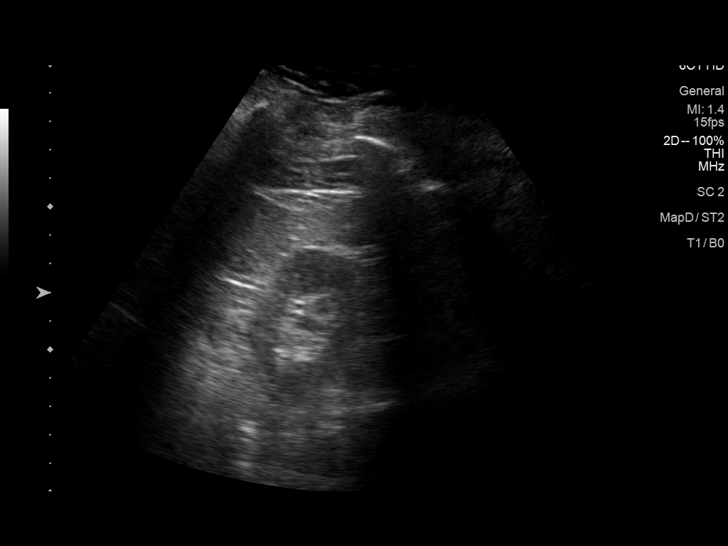
[im 20/30]
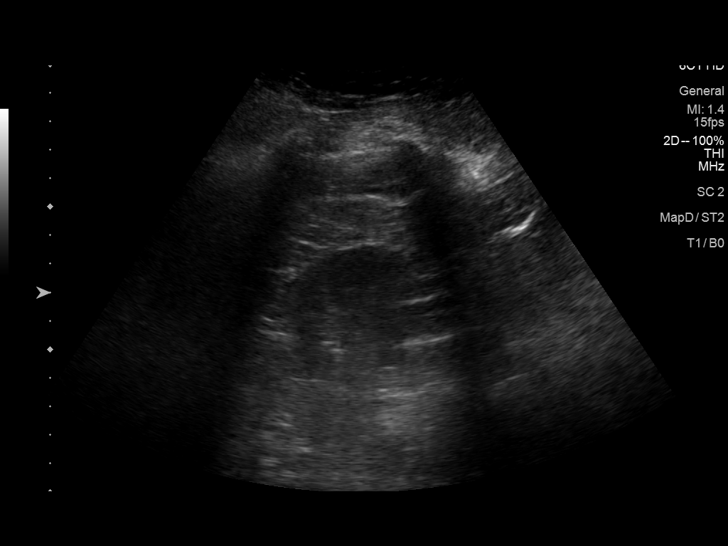
[im 22/30]
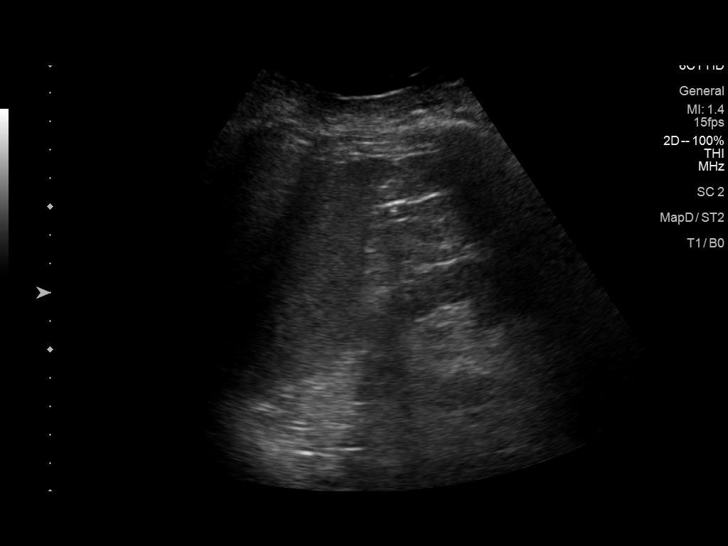
[im 25/30]
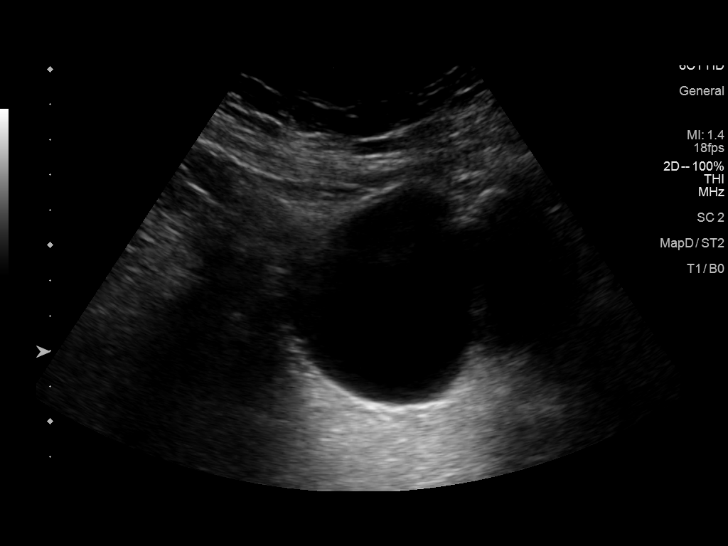
[im 27/30]
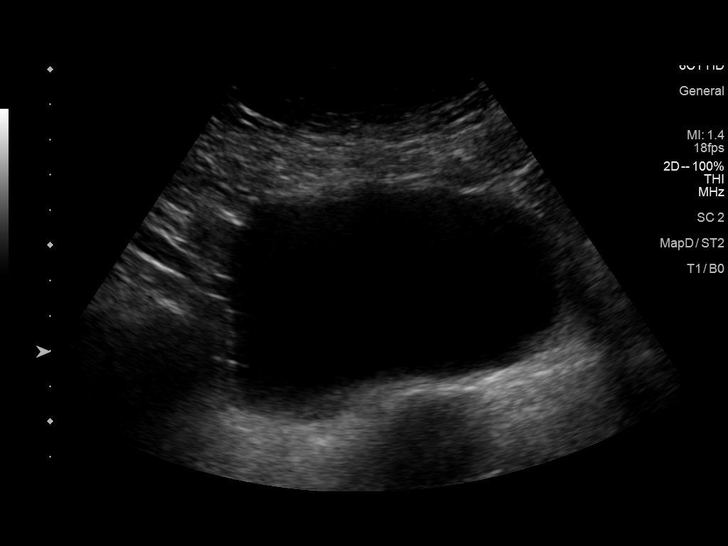
[im 30/30]
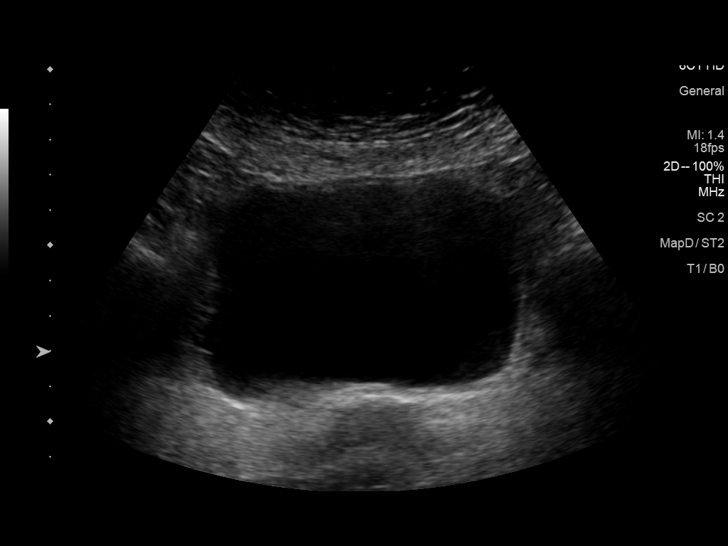

[14 of 25 positions shown; findings below may reference images not displayed]

FINDINGS: Right Kidney:

Renal measurements: 10.0 x 4.4 x 6.0 cm = volume: 135 mL.
Echogenicity within normal limits. No mass or hydronephrosis
visualized.

Left Kidney:

Renal measurements: 10.6 x 5.4 x 5.0 cm = volume: 151 mL.
Echogenicity within normal limits. No mass or hydronephrosis
visualized.

Bladder:

Appears normal for degree of bladder distention.

Other:

None.
IMPRESSION: Normal study.  No cause for microscopic hematuria identified.

## 2023-01-17 ENCOUNTER — Telehealth: Payer: Medicare Other

## 2023-01-23 ENCOUNTER — Telehealth: Payer: Medicare Other

## 2023-01-23 ENCOUNTER — Ambulatory Visit (INDEPENDENT_AMBULATORY_CARE_PROVIDER_SITE_OTHER): Payer: Medicare Other

## 2023-01-23 DIAGNOSIS — I10 Essential (primary) hypertension: Secondary | ICD-10-CM

## 2023-01-23 DIAGNOSIS — E782 Mixed hyperlipidemia: Secondary | ICD-10-CM

## 2023-01-23 DIAGNOSIS — E785 Hyperlipidemia, unspecified: Secondary | ICD-10-CM | POA: Diagnosis not present

## 2023-01-23 DIAGNOSIS — E1121 Type 2 diabetes mellitus with diabetic nephropathy: Secondary | ICD-10-CM

## 2023-01-23 DIAGNOSIS — Z794 Long term (current) use of insulin: Secondary | ICD-10-CM | POA: Diagnosis not present

## 2023-01-23 DIAGNOSIS — E1159 Type 2 diabetes mellitus with other circulatory complications: Secondary | ICD-10-CM

## 2023-01-23 NOTE — Chronic Care Management (AMB) (Signed)
Chronic Care Management   CCM RN Visit Note  01/23/2023 Name: Suzanne Hardin MRN: [id number] DOB: 09/01/1955  Subjective: Suzanne Hardin is a 67 y.o. year old female who is a primary care patient of Cox, Kirsten, MD. The patient was referred to the Chronic Care Management team for assistance with care management needs subsequent to provider initiation of CCM services and plan of care.    Today's Visit:  Engaged with patient by telephone for follow up visit.     SDOH Interventions Today    Flowsheet Row Most Recent Value  SDOH Interventions   Health Literacy Interventions Intervention Not Indicated         Goals Addressed             This Visit's Progress    RNCM Care Management  Expected Outcome:  Monitor, Self-Manage and Reduce Symptoms of Diabetes       Current Barriers:  Chronic Disease Management support and education needs related to effective management of DM Lab Results  Component Value Date   HGBA1C 6.3 (H) 12/19/2022     Planned Interventions: Provided education to patient about basic DM disease process. The patient is having a better quarter with management of her DM. She has to listen to her body and she does a good job at keeping herself in balance. She is happy that her A1C is down.  ; Reviewed medications with patient and discussed importance of medication adherence. She is compliant with medications. Worked with the pharm D in the past for effective management of medications. Continues with the pharm D;        Reviewed prescribed diet with patient heart healthy/ADA diet. Is compliant with dietary measures. She knows what foods she cannot eat and can eat; Counseled on importance of regular laboratory monitoring as prescribed. Has regular labwork. Will have new labwork in June;        Discussed plans with patient for ongoing care management follow up and provided patient with direct contact information for care management team;      Provided patient with  written educational materials related to hypo and hyperglycemia and importance of correct treatment. The patient is having good numbers and is more stable. The patient was driving. She has been testing with test strips. Does not like the continuous recorder.        Reviewed scheduled/upcoming provider appointments including: 04-10-2023 at 0820 am      call provider for findings outside established parameters;       Referral made to pharmacy team for assistance with ongoing support and education from pharm D    Review of patient status, including review of consultants reports, relevant laboratory and other test results, and medications completed;       Advised patient to discuss changes in her DM and DM health and well being with provider;      Screening for signs and symptoms of depression related to chronic disease state;        Assessed social determinant of health barriers;         Symptom Management: Take medications as prescribed   Attend all scheduled provider appointments Call provider office for new concerns or questions  call the Suicide and Crisis Lifeline: 988 call the [place] National Suicide Prevention Lifeline: (870)241-8941 or TTY: 5678674240 TTY [phone]) to talk to a trained counselor call 1-800-273-TALK (toll free, 24 hour hotline) go to [facility] [address] [place] [phone]) if experiencing a Mental  Health or Behavioral Health Crisis  check feet daily for cuts, sores or redness trim toenails straight across wash and dry feet carefully every day wear comfortable, cotton socks wear comfortable, well-fitting shoes  Follow Up Plan: Telephone follow up appointment with care management team member scheduled for: 03-20-2023 at 0930 am       RNCM Care Management  Expected Outcome:  Monitor, Self-Manage and Reduce Symptoms of: HLD       Current Barriers:  Chronic Disease Management support and education needs related to  effective management of HLD Lab Results  Component Value Date   CHOL 177 12/19/2022   HDL 60 12/19/2022   LDLCALC 105 (H) 12/19/2022   TRIG 62 12/19/2022   CHOLHDL 3.0 12/19/2022     Planned Interventions: Provider established cholesterol goals reviewed. Knows her levels and knows normals. She is happy with her levels at this time and knows that this is a good place for her to be at. Denies any acute changes in her HLD or heart health. ; Counseled on importance of regular laboratory monitoring as prescribed. Has labs on a regular basis; Provided HLD educational materials; Reviewed role and benefits of statin for ASCVD risk reduction. The patient cannot take statins due to her history. The patient provided a lot of essential information relevant to the patients health history; Discussed strategies to manage statin-induced myalgias; Reviewed importance of limiting foods high in cholesterol. The patient has several allergies to medications and foods. Has to be careful about the foods she eats and medications she takes. She knows her limitations well and is very proactive with her care to keep a balance.  ; Screening for signs and symptoms of depression related to chronic disease state;  Assessed social determinant of health barriers;   Symptom Management: Take medications as prescribed   Attend all scheduled provider appointments Call provider office for new concerns or questions  call the Suicide and Crisis Lifeline: 988 call the [place] National Suicide Prevention Lifeline: 757 621 0420 or TTY: 574-504-3772 TTY (215)328-5005) to talk to a trained counselor call 1-800-273-TALK (toll free, 24 hour hotline) go to [facility] [address] [place] [phone]) if experiencing a Mental Health or Behavioral Health Crisis  - call for medicine refill 2 or 3 days before it runs out - take all medications exactly as prescribed - call doctor with any  symptoms you believe are related to your medicine - call doctor when you experience any new symptoms - go to all doctor appointments as scheduled  Follow Up Plan: Telephone follow up appointment with care management team member scheduled for: 03-20-2023 at 0930 am       RNCM Care Management  Expected Outcome:  Monitor, Self-Manage, and Reduce Symptoms of Hypertension       Current Barriers:  Chronic Disease Management support and education needs related to effective management of HTN  BP Readings from Last 3 Encounters:  12/19/22 (!) 120/46  09/06/22 107/66  08/17/22 136/72   Self reported: 110/62 with pulse: 70  Planned Interventions: Evaluation of current treatment plan related to hypertension self management and patient's adherence to plan as established by provider. The patient states that her blood pressures are stable at this time, denies any acute changes and is happy that her pressures are stable. She is not having any inflammatory responses going on. When her blood pressures start going up she knows she needs to be aware of her body systems. She has been stable for a while and  is thankful for this. Knows when to take action;   Provided education to patient re: stroke prevention, s/s of heart attack and stroke; Reviewed prescribed diet Heart healthy/ADA diet. Knows her dietary restrictions and states that she is watching what she eats. She has a strict regimen and doses well with managing her health and well being Reviewed medications with patient and discussed importance of compliance. The patient is compliant with medications. She has several drug allergies and food allergies. She is very knowledgeable about what she can take and not take. Has upcoming pharm D consult for effective management of medications and disease processes;  Discussed plans with patient for ongoing care management follow up and provided patient with direct contact information for care management team; Advised  patient, providing education and rationale, to monitor blood pressure daily and record, calling PCP for findings outside established parameters;  Reviewed scheduled/upcoming provider appointments including: 12-19-2022 at 0900 am. Advised her to write down questions to ask the provider at her upcoming visit. Education and support given.  Advised patient to discuss changes in her HTN or heart healthy with provider; Provided education on prescribed diet heart healthy/ADA;  Discussed complications of poorly controlled blood pressure such as heart disease, stroke, circulatory complications, vision complications, kidney impairment, sexual dysfunction;  Screening for signs and symptoms of depression related to chronic disease state;  Assessed social determinant of health barriers;   Symptom Management: Take medications as prescribed   Attend all scheduled provider appointments Call provider office for new concerns or questions  call the Suicide and Crisis Lifeline: 988 call the [place] National Suicide Prevention Lifeline: 3341621647 or TTY: 314-336-1127 TTY [phone]) to talk to a trained counselor call 1-800-273-TALK (toll free, 24 hour hotline) go to [facility] [address], [place] [phone]) if experiencing a Mental Health or Behavioral Health Crisis  check blood pressure 3 times per week write blood pressure results in a log or diary learn about high blood pressure keep a blood pressure log take blood pressure log to all doctor appointments call doctor for signs and symptoms of high blood pressure develop an action plan for high blood pressure keep all doctor appointments take medications for blood pressure exactly as prescribed report new symptoms to your doctor  Follow Up Plan: Telephone follow up appointment with care management team member scheduled for: 03-20-2023 at 0930 am          Plan:Telephone follow up appointment with care  management team member scheduled for:  03-20-2023 at 0930 am  Alto Denver RN, MSN, CCM RN Care Manager  Chronic Care Management Direct Number: [phone]

## 2023-01-23 NOTE — Patient Instructions (Signed)
Please call the care guide team at [phone] if you need to cancel or reschedule your appointment.   If you are experiencing a Mental Health or Behavioral Health Crisis or need someone to talk to, please call the Suicide and Crisis Lifeline: 988 call the [place] National Suicide Prevention Lifeline: 873-836-8332 or TTY: (725)639-3516 TTY (470)817-3756) to talk to a trained counselor call 1-800-273-TALK (toll free, 24 hour hotline) go to [facility] [address], [place] [phone])   Following is a copy of the CCM Program Consent:  CCM service includes personalized support from designated clinical staff supervised by the physician, including individualized plan of care and coordination with other care providers 24/7 contact phone numbers for assistance for urgent and routine care needs. Service will only be billed when office clinical staff spend 20 minutes or more in a month to coordinate care. Only one practitioner may furnish and bill the service in a calendar month. The patient may stop CCM services at amy time (effective at the end of the month) by phone call to the office staff. The patient will be responsible for cost sharing (co-pay) or up to 20% of the service fee (after annual deductible is met)  Following is a copy of your full provider care plan:   Goals Addressed             This Visit's Progress    RNCM Care Management  Expected Outcome:  Monitor, Self-Manage and Reduce Symptoms of Diabetes       Current Barriers:  Chronic Disease Management support and education needs related to effective management of DM Lab Results  Component Value Date   HGBA1C 6.3 (H) 12/19/2022     Planned Interventions: Provided education to patient about basic DM disease process. The patient is having a better quarter with management of her DM. She has to listen to her body and she does a good job at keeping herself in balance. She is happy that her  A1C is down.  ; Reviewed medications with patient and discussed importance of medication adherence. She is compliant with medications. Worked with the pharm D in the past for effective management of medications. Continues with the pharm D;        Reviewed prescribed diet with patient heart healthy/ADA diet. Is compliant with dietary measures. She knows what foods she cannot eat and can eat; Counseled on importance of regular laboratory monitoring as prescribed. Has regular labwork. Will have new labwork in June;        Discussed plans with patient for ongoing care management follow up and provided patient with direct contact information for care management team;      Provided patient with written educational materials related to hypo and hyperglycemia and importance of correct treatment. The patient is having good numbers and is more stable. The patient was driving. She has been testing with test strips. Does not like the continuous recorder.        Reviewed scheduled/upcoming provider appointments including: 04-10-2023 at 0820 am      call provider for findings outside established parameters;       Referral made to pharmacy team for assistance with ongoing support and education from pharm D    Review of patient status, including review of consultants reports, relevant laboratory and other test results, and medications completed;       Advised patient to discuss changes in her DM and DM health and well being with provider;  Screening for signs and symptoms of depression related to chronic disease state;        Assessed social determinant of health barriers;         Symptom Management: Take medications as prescribed   Attend all scheduled provider appointments Call provider office for new concerns or questions  call the Suicide and Crisis Lifeline: 988 call the [place] National Suicide Prevention Lifeline: 303-648-9383 or TTY: 857-662-8297 TTY 5638607112) to talk to a trained counselor call  1-800-273-TALK (toll free, 24 hour hotline) go to [facility] [address] [place] [phone]) if experiencing a Mental Health or Behavioral Health Crisis  check feet daily for cuts, sores or redness trim toenails straight across wash and dry feet carefully every day wear comfortable, cotton socks wear comfortable, well-fitting shoes  Follow Up Plan: Telephone follow up appointment with care management team member scheduled for: 03-20-2023 at 0930 am       RNCM Care Management  Expected Outcome:  Monitor, Self-Manage and Reduce Symptoms of: HLD       Current Barriers:  Chronic Disease Management support and education needs related to effective management of HLD Lab Results  Component Value Date   CHOL 177 12/19/2022   HDL 60 12/19/2022   LDLCALC 105 (H) 12/19/2022   TRIG 62 12/19/2022   CHOLHDL 3.0 12/19/2022     Planned Interventions: Provider established cholesterol goals reviewed. Knows her levels and knows normals. She is happy with her levels at this time and knows that this is a good place for her to be at. Denies any acute changes in her HLD or heart health. ; Counseled on importance of regular laboratory monitoring as prescribed. Has labs on a regular basis; Provided HLD educational materials; Reviewed role and benefits of statin for ASCVD risk reduction. The patient cannot take statins due to her history. The patient provided a lot of essential information relevant to the patients health history; Discussed strategies to manage statin-induced myalgias; Reviewed importance of limiting foods high in cholesterol. The patient has several allergies to medications and foods. Has to be careful about the foods she eats and medications she takes. She knows her limitations well and is very proactive with her care to keep a balance.  ; Screening for signs and symptoms of depression related to chronic disease state;  Assessed social determinant  of health barriers;   Symptom Management: Take medications as prescribed   Attend all scheduled provider appointments Call provider office for new concerns or questions  call the Suicide and Crisis Lifeline: 988 call the [place] National Suicide Prevention Lifeline: 570-672-8954 or TTY: (423)468-3373 TTY 502 471 3264) to talk to a trained counselor call 1-800-273-TALK (toll free, 24 hour hotline) go to [facility] [address] [place] [phone]) if experiencing a Mental Health or Behavioral Health Crisis  - call for medicine refill 2 or 3 days before it runs out - take all medications exactly as prescribed - call doctor with any symptoms you believe are related to your medicine - call doctor when you experience any new symptoms - go to all doctor appointments as scheduled  Follow Up Plan: Telephone follow up appointment with care management team member scheduled for: 03-20-2023 at 0930 am       RNCM Care Management  Expected Outcome:  Monitor, Self-Manage, and Reduce Symptoms of Hypertension       Current Barriers:  Chronic Disease Management support and education needs related to effective management of HTN  BP Readings from Last 3 Encounters:  12/19/22 (!) 120/46  09/06/22 107/66  08/17/22 136/72   Self reported: 110/62 with pulse: 70  Planned Interventions: Evaluation of current treatment plan related to hypertension self management and patient's adherence to plan as established by provider. The patient states that her blood pressures are stable at this time, denies any acute changes and is happy that her pressures are stable. She is not having any inflammatory responses going on. When her blood pressures start going up she knows she needs to be aware of her body systems. She has been stable for a while and is thankful for this. Knows when to take action;   Provided education to patient re: stroke prevention, s/s of heart attack and  stroke; Reviewed prescribed diet Heart healthy/ADA diet. Knows her dietary restrictions and states that she is watching what she eats. She has a strict regimen and doses well with managing her health and well being Reviewed medications with patient and discussed importance of compliance. The patient is compliant with medications. She has several drug allergies and food allergies. She is very knowledgeable about what she can take and not take. Has upcoming pharm D consult for effective management of medications and disease processes;  Discussed plans with patient for ongoing care management follow up and provided patient with direct contact information for care management team; Advised patient, providing education and rationale, to monitor blood pressure daily and record, calling PCP for findings outside established parameters;  Reviewed scheduled/upcoming provider appointments including: 12-19-2022 at 0900 am. Advised her to write down questions to ask the provider at her upcoming visit. Education and support given.  Advised patient to discuss changes in her HTN or heart healthy with provider; Provided education on prescribed diet heart healthy/ADA;  Discussed complications of poorly controlled blood pressure such as heart disease, stroke, circulatory complications, vision complications, kidney impairment, sexual dysfunction;  Screening for signs and symptoms of depression related to chronic disease state;  Assessed social determinant of health barriers;   Symptom Management: Take medications as prescribed   Attend all scheduled provider appointments Call provider office for new concerns or questions  call the Suicide and Crisis Lifeline: 988 call the [place] National Suicide Prevention Lifeline: (908) 720-2447 or TTY: (850)420-6014 TTY (940)267-3332) to talk to a trained counselor call 1-800-273-TALK (toll free, 24 hour hotline) go to [facility] [address]  [place] [phone]) if experiencing a Mental Health or Behavioral Health Crisis  check blood pressure 3 times per week write blood pressure results in a log or diary learn about high blood pressure keep a blood pressure log take blood pressure log to all doctor appointments call doctor for signs and symptoms of high blood pressure develop an action plan for high blood pressure keep all doctor appointments take medications for blood pressure exactly as prescribed report new symptoms to your doctor  Follow Up Plan: Telephone follow up appointment with care management team member scheduled for: 03-20-2023 at 0930 am          Patient verbalizes understanding of instructions and care plan provided today and agrees to view in MyChart. Active MyChart status and patient understanding of how to access instructions and care plan via MyChart confirmed with patient.  Telephone follow up appointment with care management team member scheduled for: 03-20-2023 at 0930 am

## 2023-01-24 ENCOUNTER — Telehealth: Payer: Self-pay

## 2023-01-24 NOTE — Telephone Encounter (Signed)
Patient was notified that patient assistance has arrived for toujeo. Order #  [id number]. Customer # [id number]

## 2023-01-25 NOTE — Telephone Encounter (Signed)
Patient assistance (Toujeo) picked up by patient today.  Jacklynn Bue, LPN  40/98/11 91:47 AM

## 2023-01-30 ENCOUNTER — Telehealth: Payer: Self-pay

## 2023-01-30 NOTE — Telephone Encounter (Signed)
Patient notified Trulicity is here to be picked up.  There are TWO BAGS in the 'fridge.

## 2023-01-31 NOTE — Telephone Encounter (Signed)
Patient picked up her patient assistance. TWO BAGS.

## 2023-02-12 ENCOUNTER — Ambulatory Visit: Payer: Medicare Other

## 2023-02-12 VITALS — Ht 67.0 in | Wt 185.0 lb

## 2023-02-12 DIAGNOSIS — Z0000 Encounter for general adult medical examination without abnormal findings: Secondary | ICD-10-CM

## 2023-02-12 NOTE — Progress Notes (Signed)
Subjective:   Suzanne Hardin is a 67 y.o. female who presents for Medicare Annual (Subsequent) preventive examination.  Visit Complete: Virtual  I connected with  [name] on 02/12/23 by a audio enabled telemedicine application and verified that I am speaking with the correct person using two identifiers.  Patient Location: Home  Provider Location: Home Office  I discussed the limitations of evaluation and management by telemedicine. The patient expressed understanding and agreed to proceed.    Review of Systems    Vital Signs: Unable to obtain new vitals due to this being a telehealth visit.  Cardiac Risk Factors include: advanced age (>57men, >23 women);diabetes mellitus;hypertension     Objective:    Today's Vitals   02/12/23 0931  Weight: 185 lb (83.9 kg)  Height: 5\' 7"  (1.702 m)   Body mass index is 28.98 kg/m.     02/12/2023    9:40 AM 05/15/2021   11:23 AM  Advanced Directives  Does Patient Have a Medical Advance Directive? Yes Yes  Type of Estate agent of [place];Living will Healthcare Power of [place];Living will  Copy of Healthcare Power of Attorney in Chart? No - copy requested No - copy requested    Current Medications (verified) Outpatient Encounter Medications as of 02/12/2023  Medication Sig   Alpha-Lipoic Acid 300 MG CAPS Take 1 capsule by mouth daily.   baclofen (LIORESAL) 10 MG tablet Take 10 mg by mouth 3 (three) times daily as needed.   Blood Glucose Calibration (ONETOUCH VERIO) SOLN 1 each by In Vitro route every 30 (thirty) days.   Blood Glucose Monitoring Suppl (ONETOUCH VERIO) w/Device KIT 1 each by Does not apply route 3 (three) times daily.   chlorpheniramine (CHLOR-TRIMETON) 4 MG tablet Take 1 mg by mouth daily.   Cholecalciferol (VITAMIN D) 125 MCG (5000 UT) CAPS Take 5,000 Units by mouth in the morning and at bedtime.   CONTOUR NEXT TEST test strip USE AS INSTRUCTED   Cyanocobalamin 1000 MCG TBCR Take 1  tablet by mouth every other day.   D-Mannose POWD Take by mouth as needed.   Dulaglutide 1.5 MG/0.5ML SOPN Inject 1.5 mg into the skin 2 (two) times a week.   hydrOXYzine (ATARAX) 10 MG/5ML syrup Take by mouth.   ibuprofen (ADVIL) 200 MG tablet Take 200 mg by mouth every 6 (six) hours as needed.   Lancets MISC by Does not apply route.   NOVOLOG FLEXPEN 100 UNIT/ML FlexPen Patient uses slidding scale. Maximum 30 units uses only blood sugar >250mg /dl. Using 8-10 units when sugars are high lately. (Patient taking differently: Patient uses sliding scale. Maximum 30 units uses only blood sugar >250mg /dl. Using 8-10 units when sugars are high lately.)   pyridOXINE (VITAMIN B-6) 50 MG tablet Take 50 mg by mouth every other day. Takes the p5pb6 version   TOUJEO SOLOSTAR 300 UNIT/ML Solostar Pen Inject 23 Units into the skin daily. (Patient taking differently: Inject 20 Units into the skin in the morning and at bedtime. 20-25 units in the morning and 20-25 in the evening)   TRIAMCINOLONE ACETONIDE, TOP, 0.05 % OINT Apply topically daily as needed. Uses as needed for rash, bug bites or on gums before dentist.   Zinc 25 MG TABS Take by mouth daily as needed.   No facility-administered encounter medications on file as of 02/12/2023.    Allergies (verified) Dextromethorphan-guaifenesin, Dorzolamide hcl-timolol mal, Hydrocodone, Lidocaine, Other, Sodium laureth sulfate, Sulfa antibiotics, Sulfites, Statins, Articaine-epinephrine, Cymbalta [duloxetine hcl], Darvon [propoxyphene], Duloxetine,  Gentamicin sulfate, Hctz [hydrochlorothiazide], Humalog [insulin lispro], Keflex [cephalexin], Lasix [furosemide], Lisinopril-hydrochlorothiazide, Loratadine, Losartan, Macrodantin [nitrofurantoin], Metformin, Mucinex [guaifenesin er], Peanut-containing drug products, Penicillins, Pioglitazone, Tessalon perles [benzonatate], Travatan [travoprost], Tylenol [acetaminophen], Propofol, and Sulfamethoxazole-trimethoprim    History: Past Medical History:  Diagnosis Date   Allergy history, peanuts 09/04/2012   Carotid artery occlusion 10/28/2013   Cervical dystonia 04/03/2017   Cystitis    Essential hypertension 09/16/2019   Hypertension    Moderate persistent asthma 09/16/2019   Neuropathy, peripheral 05/12/2014   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 Food, (peanuts, bell peppers, raw onions,pickles) Drug, and Skin, environmental, fragrances & air freshners   Type 2 diabetes mellitus with other specified complication (HCC) 09/16/2019   Past Surgical History:  Procedure Laterality Date   APPENDECTOMY  2000   CATARACT EXTRACTION, BILATERAL     CHOLECYSTECTOMY  2000   TONSILLECTOMY AND ADENOIDECTOMY     Family History  Problem Relation Age of Onset   Ulcers Father    Peripheral Artery Disease Father    Breast cancer Maternal Aunt    Breast cancer Maternal Aunt    Breast cancer Cousin    Social History   Socioeconomic History   Marital status: Divorced    Spouse name: Not on file   Number of children: 2   Years of education: Not on file   Highest education level: Not on file  Occupational History   Occupation: Disabled  Tobacco Use   Smoking status: Never   Smokeless tobacco: Never  Vaping Use   Vaping status: Never Used  Substance and Sexual Activity   Alcohol use: Never   Drug use: Never   Sexual activity: Yes    Partners: Male  Other Topics Concern   Not on file  Social History Narrative   Not on file   Social Determinants of Health   Financial Resource Strain: Low Risk  (02/12/2023)   Overall Financial Resource Strain (CARDIA)    Difficulty of Paying Living Expenses: Not hard at all  Food Insecurity: No Food Insecurity (02/12/2023)   Hunger Vital Sign    Worried About Running Out of Food in the Last Year: Never true    Ran Out of Food in the Last Year: Never true   Transportation Needs: No Transportation Needs (02/12/2023)   PRAPARE - Administrator, Civil Service (Medical): No    Lack of Transportation (Non-Medical): No  Physical Activity: Sufficiently Active (02/12/2023)   Exercise Vital Sign    Days of Exercise per Week: 5 days    Minutes of Exercise per Session: 150+ min  Stress: No Stress Concern Present (02/12/2023)   [employer] of Occupational Health - Occupational Stress Questionnaire    Feeling of Stress : Not at all  Social Connections: Patient Declined (02/12/2023)   Social Connection and Isolation Panel [NHANES]    Frequency of Communication with Friends and Family: Patient declined    Frequency of Social Gatherings with Friends and Family: Patient declined    Attends Religious Services: Patient declined    Database administrator or Organizations: Patient declined    Attends Banker Meetings: Patient declined    Marital Status: Patient declined    Tobacco Counseling Counseling given: Not Answered   Clinical Intake:  Pre-visit preparation completed: Yes  Pain : No/denies pain     BMI - recorded: 28.98 Nutritional  Status: BMI 25 -29 Overweight Nutritional Risks: None Diabetes: Yes CBG done?: Yes (CBG 134 Per patient) CBG resulted in Enter/ Edit results?: Yes Did pt. bring in CBG monitor from home?: No  How often do you need to have someone help you when you read instructions, pamphlets, or other written materials from your doctor or pharmacy?: 1 - Never  Interpreter Needed?: No  Information entered by :: Theresa Mulligan LPN   Activities of Daily Living    02/12/2023    9:39 AM  In your present state of health, do you have any difficulty performing the following activities:  Hearing? 0  Vision? 0  Difficulty concentrating or making decisions? 0  Walking or climbing stairs? 0  Dressing or bathing? 0  Doing errands, shopping? 0  Preparing Food and eating ? N  Using the Toilet? N  In  the past six months, have you accidently leaked urine? N  Do you have problems with loss of bowel control? N  Managing your Medications? N  Managing your Finances? N  Housekeeping or managing your Housekeeping? N    Patient Care Team: Blane Ohara, MD as PCP - General (Family Medicine) Marlowe Sax, RN as Case Manager (General Practice) Alden Hipp, RPH-CPP (Pharmacist)  Indicate any recent Medical Services you may have received from other than [facility] providers in the past year (date may be approximate).     Assessment:   This is a routine wellness examination for Ahavah.  Hearing/Vision screen Hearing Screening - Comments:: Denies hearing difficulties   Vision Screening - Comments:: Wears rx glasses - up to date with routine eye exams with  [facility]  Dietary issues and exercise activities discussed:     Goals Addressed               This Visit's Progress     Stay alive and be healthy (pt-stated)         Depression Screen    02/12/2023    9:37 AM 09/06/2022   10:32 AM 08/17/2022    8:26 AM 05/15/2021   11:24 AM 02/10/2021    9:36 AM 01/15/2020    8:18 AM  PHQ 2/9 Scores  PHQ - 2 Score 0 0 0 0 0 0    Fall Risk    02/12/2023    9:39 AM 09/06/2022   10:40 AM 08/17/2022    8:26 AM 05/15/2021   11:24 AM 02/10/2021    9:36 AM  Fall Risk   Falls in the past year? 0 0 0 0 0  Number falls in past yr: 0 0 0 0 0  Injury with Fall? 0 0 0 0 0  Risk for fall due to : No Fall Risks No Fall Risks No Fall Risks History of fall(s)   Follow up Falls prevention discussed Falls evaluation completed Falls evaluation completed Falls prevention discussed     MEDICARE RISK AT HOME: Medicare Risk at Home Any stairs in or around the home?: Yes If so, are there any without handrails?: No Home free of loose throw rugs in walkways, pet beds, electrical cords, etc?: Yes Adequate lighting in your home to reduce risk of falls?: Yes Life alert?: No Use of a cane, walker or  w/c?: No Grab bars in the bathroom?: No Shower chair or bench in shower?: No Elevated toilet seat or a handicapped toilet?: No  TIMED UP AND GO:  Was the test performed?  No    Cognitive Function:  02/12/2023    9:40 AM 05/15/2021   11:28 AM  6CIT Screen  What Year? -- 0 points  What month? -- 0 points  What time? -- 0 points  Count back from 20 -- 0 points  Months in reverse -- 0 points  Repeat phrase -- 10 points  Total Score  10 points    Immunizations Immunization History  Administered Date(s) Administered   Influenza-Unspecified 04/09/2013    TDAP status: Due, Education has been provided regarding the importance of this vaccine. Advised may receive this vaccine at local pharmacy or Health Dept. Aware to provide a copy of the vaccination record if obtained from local pharmacy or Health Dept. Verbalized acceptance and understanding.   Screening Tests Health Maintenance  Topic Date Due   HIV Screening  Never done   Hepatitis C Screening  Never done   DTaP/Tdap/Td (1 - Tdap) Never done   DEXA SCAN  Never done   OPHTHALMOLOGY EXAM  07/26/2022   PAP SMEAR-Modifier  01/15/2023   HEMOGLOBIN A1C  06/20/2023   Diabetic kidney evaluation - eGFR measurement  12/19/2023   Diabetic kidney evaluation - Urine ACR  12/19/2023   FOOT EXAM  12/19/2023   Fecal DNA (Cologuard)  01/31/2024   Medicare Annual Wellness (AWV)  02/12/2024   MAMMOGRAM  09/25/2024   HPV VACCINES  Aged Out   Pneumonia Vaccine 102+ Years old  Discontinued   Colonoscopy  Discontinued   COVID-19 Vaccine  Discontinued   Zoster Vaccines- Shingrix  Discontinued    Health Maintenance  Health Maintenance Due  Topic Date Due   HIV Screening  Never done   Hepatitis C Screening  Never done   DTaP/Tdap/Td (1 - Tdap) Never done   DEXA SCAN  Never done   OPHTHALMOLOGY EXAM  07/26/2022   PAP SMEAR-Modifier  01/15/2023    Colorectal cancer screening: Type of screening: Colonoscopy. Completed 01/30/21.  Repeat every 3 years  Mammogram status: Completed 09/26/22. Repeat every year  Bone Density status: Ordered Patient deferred. Pt provided with contact info and advised to call to schedule appt.  Lung Cancer Screening: (Low Dose CT Chest recommended if Age 55-80 years, 20 pack-year currently smoking OR have quit w/in 15years.) does not qualify.     Additional Screening:  Hepatitis C Screening: does qualify;  Deferred  Vision Screening: Recommended annual ophthalmology exams for early detection of glaucoma and other disorders of the eye. Is the patient up to date with their annual eye exam?  Yes  Who is the provider or what is the name of the office in which the patient attends annual eye exams? [facility] If pt is not established with a provider, would they like to be referred to a provider to establish care? No .   Dental Screening: Recommended annual dental exams for proper oral hygiene  Diabetic Foot Exam: Diabetic Foot Exam: Completed 12/19/22  Community Resource Referral / Chronic Care Management:  CRR required this visit?  No   CCM required this visit?  No     Plan:     I have personally reviewed and noted the following in the patient's chart:   Medical and social history Use of alcohol, tobacco or illicit drugs  Current medications and supplements including opioid prescriptions. Patient is not currently taking opioid prescriptions. Functional ability and status Nutritional status Physical activity Advanced directives List of other physicians Hospitalizations, surgeries, and ER visits in previous 12 months Vitals Screenings to include cognitive, depression, and falls Referrals and  appointments  In addition, I have reviewed and discussed with patient certain preventive protocols, quality metrics, and best practice recommendations. A written personalized care plan for preventive services as well as general preventive health recommendations were provided to  patient.     Tillie Rung, LPN   09/25/4740   After Visit Summary: (MyChart) Due to this being a telephonic visit, the after visit summary with patients personalized plan was offered to patient via MyChart   Nurse Notes: Patient due Hep- C and HIV Screening

## 2023-02-12 NOTE — Patient Instructions (Addendum)
Suzanne Hardin , Thank you for taking time to come for your Medicare Wellness Visit. I appreciate your ongoing commitment to your health goals. Please review the following plan we discussed and let me know if I can assist you in the future.   Referrals/Orders/Follow-Ups/Clinician Recommendations:   This is a list of the screening recommended for you and due dates:  Health Maintenance  Topic Date Due   HIV Screening  Never done   Hepatitis C Screening  Never done   DTaP/Tdap/Td vaccine (1 - Tdap) Never done   DEXA scan (bone density measurement)  Never done   Eye exam for diabetics  07/26/2022   Pap Smear  01/15/2023   Hemoglobin A1C  06/20/2023   Yearly kidney function blood test for diabetes  12/19/2023   Yearly kidney health urinalysis for diabetes  12/19/2023   Complete foot exam   12/19/2023   Cologuard (Stool DNA test)  01/31/2024   Medicare Annual Wellness Visit  02/12/2024   Mammogram  09/25/2024   HPV Vaccine  Aged Out   Pneumonia Vaccine  Discontinued   Colon Cancer Screening  Discontinued   COVID-19 Vaccine  Discontinued   Zoster (Shingles) Vaccine  Discontinued    Advanced directives: (Copy Requested) Please bring a copy of your health care power of attorney and living will to the office to be added to your chart at your convenience.  Next Medicare Annual Wellness Visit scheduled for next year: Yes  Preventive Care 54 Years and Older, Female Preventive care refers to lifestyle choices and visits with your health care provider that can promote health and wellness. What does preventive care include? A yearly physical exam. This is also called an annual well check. Dental exams once or twice a year. Routine eye exams. Ask your health care provider how often you should have your eyes checked. Personal lifestyle choices, including: Daily care of your teeth and gums. Regular physical activity. Eating a healthy diet. Avoiding tobacco and drug use. Limiting alcohol  use. Practicing safe sex. Taking low-dose aspirin every day. Taking vitamin and mineral supplements as recommended by your health care provider. What happens during an annual well check? The services and screenings done by your health care provider during your annual well check will depend on your age, overall health, lifestyle risk factors, and family history of disease. Counseling  Your health care provider may ask you questions about your: Alcohol use. Tobacco use. Drug use. Emotional well-being. Home and relationship well-being. Sexual activity. Eating habits. History of falls. Memory and ability to understand (cognition). Work and work Astronomer. Reproductive health. Screening  You may have the following tests or measurements: Height, weight, and BMI. Blood pressure. Lipid and cholesterol levels. These may be checked every 5 years, or more frequently if you are over 95 years old. Skin check. Lung cancer screening. You may have this screening every year starting at age 53 if you have a 30-pack-year history of smoking and currently smoke or have quit within the past 15 years. Fecal occult blood test (FOBT) of the stool. You may have this test every year starting at age 46. Flexible sigmoidoscopy or colonoscopy. You may have a sigmoidoscopy every 5 years or a colonoscopy every 10 years starting at age 25. Hepatitis C blood test. Hepatitis B blood test. Sexually transmitted disease (STD) testing. Diabetes screening. This is done by checking your blood sugar (glucose) after you have not eaten for a while (fasting). You may have this done every 1-3 years. Bone density  scan. This is done to screen for osteoporosis. You may have this done starting at age 63. Mammogram. This may be done every 1-2 years. Talk to your health care provider about how often you should have regular mammograms. Talk with your health care provider about your test results, treatment options, and if necessary,  the need for more tests. Vaccines  Your health care provider may recommend certain vaccines, such as: Influenza vaccine. This is recommended every year. Tetanus, diphtheria, and acellular pertussis (Tdap, Td) vaccine. You may need a Td booster every 10 years. Zoster vaccine. You may need this after age 86. Pneumococcal 13-valent conjugate (PCV13) vaccine. One dose is recommended after age 64. Pneumococcal polysaccharide (PPSV23) vaccine. One dose is recommended after age 36. Talk to your health care provider about which screenings and vaccines you need and how often you need them. This information is not intended to replace advice given to you by your health care provider. Make sure you discuss any questions you have with your health care provider. Document Released: 07/08/2015 Document Revised: 02/29/2016 Document Reviewed: 04/12/2015 Elsevier Interactive Patient Education  2017 [employer].  Fall Prevention in the Home Falls can cause injuries. They can happen to people of all ages. There are many things you can do to make your home safe and to help prevent falls. What can I do on the outside of my home? Regularly fix the edges of walkways and driveways and fix any cracks. Remove anything that might make you trip as you walk through a door, such as a raised step or threshold. Trim any bushes or trees on the path to your home. Use bright outdoor lighting. Clear any walking paths of anything that might make someone trip, such as rocks or tools. Regularly check to see if handrails are loose or broken. Make sure that both sides of any steps have handrails. Any raised decks and porches should have guardrails on the edges. Have any leaves, snow, or ice cleared regularly. Use sand or salt on walking paths during winter. Clean up any spills in your garage right away. This includes oil or grease spills. What can I do in the bathroom? Use night lights. Install grab bars by the toilet and in the  tub and shower. Do not use towel bars as grab bars. Use non-skid mats or decals in the tub or shower. If you need to sit down in the shower, use a plastic, non-slip stool. Keep the floor dry. Clean up any water that spills on the floor as soon as it happens. Remove soap buildup in the tub or shower regularly. Attach bath mats securely with double-sided non-slip rug tape. Do not have throw rugs and other things on the floor that can make you trip. What can I do in the bedroom? Use night lights. Make sure that you have a light by your bed that is easy to reach. Do not use any sheets or blankets that are too big for your bed. They should not hang down onto the floor. Have a firm chair that has side arms. You can use this for support while you get dressed. Do not have throw rugs and other things on the floor that can make you trip. What can I do in the kitchen? Clean up any spills right away. Avoid walking on wet floors. Keep items that you use a lot in easy-to-reach places. If you need to reach something above you, use a strong step stool that has a grab bar. Keep electrical  cords out of the way. Do not use floor polish or wax that makes floors slippery. If you must use wax, use non-skid floor wax. Do not have throw rugs and other things on the floor that can make you trip. What can I do with my stairs? Do not leave any items on the stairs. Make sure that there are handrails on both sides of the stairs and use them. Fix handrails that are broken or loose. Make sure that handrails are as long as the stairways. Check any carpeting to make sure that it is firmly attached to the stairs. Fix any carpet that is loose or worn. Avoid having throw rugs at the top or bottom of the stairs. If you do have throw rugs, attach them to the floor with carpet tape. Make sure that you have a light switch at the top of the stairs and the bottom of the stairs. If you do not have them, ask someone to add them for  you. What else can I do to help prevent falls? Wear shoes that: Do not have high heels. Have rubber bottoms. Are comfortable and fit you well. Are closed at the toe. Do not wear sandals. If you use a stepladder: Make sure that it is fully opened. Do not climb a closed stepladder. Make sure that both sides of the stepladder are locked into place. Ask someone to hold it for you, if possible. Clearly mark and make sure that you can see: Any grab bars or handrails. First and last steps. Where the edge of each step is. Use tools that help you move around (mobility aids) if they are needed. These include: Canes. Walkers. Scooters. Crutches. Turn on the lights when you go into a dark area. Replace any light bulbs as soon as they burn out. Set up your furniture so you have a clear path. Avoid moving your furniture around. If any of your floors are uneven, fix them. If there are any pets around you, be aware of where they are. Review your medicines with your doctor. Some medicines can make you feel dizzy. This can increase your chance of falling. Ask your doctor what other things that you can do to help prevent falls. This information is not intended to replace advice given to you by your health care provider. Make sure you discuss any questions you have with your health care provider. Document Released: 04/07/2009 Document Revised: 11/17/2015 Document Reviewed: 07/16/2014 Elsevier Interactive Patient Education  2017 [employer].

## 2023-03-05 ENCOUNTER — Ambulatory Visit (INDEPENDENT_AMBULATORY_CARE_PROVIDER_SITE_OTHER): Payer: Medicare Other

## 2023-03-05 VITALS — BP 138/72 | HR 80 | Temp 97.8°F | Ht 67.0 in | Wt 192.8 lb

## 2023-03-05 DIAGNOSIS — R051 Acute cough: Secondary | ICD-10-CM | POA: Diagnosis not present

## 2023-03-05 DIAGNOSIS — J4540 Moderate persistent asthma, uncomplicated: Secondary | ICD-10-CM

## 2023-03-05 LAB — POC COVID19 BINAXNOW: SARS Coronavirus 2 Ag: NEGATIVE

## 2023-03-05 MED ORDER — PREDNISONE 20 MG PO TABS: 20.0000 mg | ORAL_TABLET | Freq: Two times a day (BID) | ORAL | 0 refills | Status: AC
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
## 2023-03-05 NOTE — Assessment & Plan Note (Signed)
As above. Sent prednisone to her pharmacy

## 2023-03-05 NOTE — Assessment & Plan Note (Signed)
Suzanne Hardin has a history of asthma. This acute cough could be a flare of her asthma versus allergies versus viral URI Advised to continue supportive treatment as she had completely normal physical exam today except for mild congestion in her nasal mucosa Since she is afraid that this will progress into severe symptoms, send her a course of prednisone 20 mg to take twice daily for 5 to 7 days. She is aware that this could increase her blood sugars, so recommended to be very mindful of her calorie intake while on prednisone. She could continue the ipratropium nebs and chlorpheniramine,  Advised to report back in 1 to 2 weeks if there is no improvement in her symptoms, to consider a chest x-ray and reviewed the need for antibiotics. I do not recommend antibiotics at this time. She does understand this.

## 2023-03-05 NOTE — Patient Instructions (Signed)
Sent prednisone to your pharmacy for what appears to be a asthma flare or allergies or viral upper respiratory infection. Continue supportive treatment If you do not feel any better in 1-2 more weeks, call back to have a chest x ray ordered.

## 2023-03-20 ENCOUNTER — Other Ambulatory Visit: Payer: Medicare Other

## 2023-03-25 ENCOUNTER — Other Ambulatory Visit: Payer: Self-pay

## 2023-03-25 ENCOUNTER — Other Ambulatory Visit: Payer: Medicare Other

## 2023-03-25 NOTE — Patient Instructions (Signed)
Visit Information  Thank you for taking time to visit with me today. Please don't hesitate to contact me if I can be of assistance to you before our next scheduled telephone appointment.  Following are the goals we discussed today:  Alto Denver RN, MSN, CCM RN Care Manager  [place]  [facility]  Direct Number: [phone]   Our next appointment is by telephone on 05-20-2023 at 0945 am  Please call the care guide team at [phone] if you need to cancel or reschedule your appointment.   If you are experiencing a Mental Health or Behavioral Health Crisis or need someone to talk to, please call the Suicide and Crisis Lifeline: 988 call the [place] National Suicide Prevention Lifeline: (321) 499-5826 or TTY: 219-511-8169 TTY 270-593-4245) to talk to a trained counselor call 1-800-273-TALK (toll free, 24 hour hotline) go to [facility] [address] [place] [phone])   Patient verbalizes understanding of instructions and care plan provided today and agrees to view in MyChart. Active MyChart status and patient understanding of how to access instructions and care plan via MyChart confirmed with patient.     Telephone follow up appointment with care management team member scheduled for: 05-20-2023 at 0945 am  Alto Denver RN, MSN, CCM RN Care Manager  [facility]  Ambulatory Care Management  Direct Number: [phone]
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
## 2023-03-28 ENCOUNTER — Other Ambulatory Visit: Payer: Medicare Other | Admitting: Pharmacist

## 2023-03-28 NOTE — Progress Notes (Addendum)
03/28/2023 Name: Suzanne Hardin MRN: [id number] DOB: 1956/01/15  Chief Complaint  Patient presents with   Medication Assistance    IOANA VERON is a 67 y.o. year old female who presented for a telephone visit.   They were referred to the pharmacist by their PCP for assistance in managing diabetes and medication access. - trulicity, toujeo, novolog are obtained via patient assistance programs and patient is requesting 2025 renewals.  Subjective:  Care Team: Primary Care Provider: Blane Ohara, MD   Medication Access/Adherence  Current Pharmacy:  [facility] [place], [place] - [address] [place] [address] Phone: [phone] Fax: [phone]  [id number] - [place], [place] - [address] AT [facility] [address] [place] [address] Phone: [phone] Fax: [phone]   Diabetes:  Current medications: trulicity 1.5mg  two times per week (Mon, Thurs), novolog uses PRN for BG >250, toujeo 16 units AM and PM   Current glucose readings: 126, 149 Using contour traditional meter  Current medication access support: trulicity (lillycares), toujeo (sanofi), novolog (novonordisk)   Objective:  Lab Results  Component Value Date   HGBA1C 6.3 (H) 12/19/2022    Lab Results  Component Value Date   CREATININE 0.90 12/19/2022   BUN 11 12/19/2022   NA 142 12/19/2022   K 4.2 12/19/2022   CL 103 12/19/2022   CO2 25 12/19/2022    Lab Results  Component Value Date   CHOL 177 12/19/2022   HDL 60 12/19/2022   LDLCALC 105 (H) 12/19/2022   TRIG 62 12/19/2022   CHOLHDL 3.0 12/19/2022    Medications Reviewed Today     Reviewed by Gabriel Carina, RPH (Pharmacist) on 03/28/23 at 1029  Med List Status: <None>   Medication Order Taking? Sig Documenting Provider Last Dose Status Informant  Alpha-Lipoic Acid 300 MG CAPS [id number] Yes Take 1 capsule by mouth daily. [provider] Taking Active   baclofen (LIORESAL) 10 MG tablet [id number] Yes Take 10 mg by mouth 3 (three) times daily as needed. [provider] Taking Active   Blood Glucose Calibration (ONETOUCH VERIO) SOLN [id number] Yes 1 each by In Vitro route every 30 (thirty) days. Abigail Miyamoto, MD Taking Active   Blood Glucose Monitoring Suppl [facility] VERIO) w/Device Andria Rhein [id number] Yes 1 each by Does not apply route 3 (three) times daily. Abigail Miyamoto, MD Taking Active   chlorpheniramine (CHLOR-TRIMETON) 4 MG tablet [id number] Yes Take 1 mg by mouth daily. [provider] Taking Active   Cholecalciferol (VITAMIN D) 125 MCG (5000 UT) CAPS [id number] Yes Take 5,000 Units by mouth in the morning and at bedtime. [provider] Taking Active   CONTOUR NEXT TEST test strip [id number] Yes USE AS INSTRUCTED Abigail Miyamoto, MD Taking Active   Cyanocobalamin 1000 MCG TBCR [id number] Yes Take 1 tablet by mouth every other day. [provider] Taking Active   D-Mannose POWD [id number] No Take by mouth as needed.  Patient not taking: Reported on 03/28/2023   [provider] Not Taking Active   Dulaglutide 1.5 MG/0.5ML SOPN [id number] Yes Inject 1.5 mg into the skin 2 (two) times a week. [provider] Taking Active            Med Note Clearance Coots, Mel Almond Sep 24, 2022  9:14 AM) Taking Mondays and Thursdays  hydrOXYzine (ATARAX) 10 MG/5ML syrup [id number] Yes Take by mouth.  [provider] Taking Active            Med Note Tyler Deis Mar 28, 2023 10:24 AM) Only as needed for food allergy reactions  ibuprofen (ADVIL) 200 MG tablet [id number] Yes Take 200 mg by mouth every 6 (six) hours as needed. [provider] Taking Active   Lancets MISC [id number] Yes by Does not apply route. [provider] Taking Active   NOVOLOG FLEXPEN 100 UNIT/ML FlexPen [id number] Yes Patient uses slidding scale. Maximum 30  units uses only blood sugar >250mg /dl. Using 8-10 units when sugars are high lately.  Patient taking differently: Patient uses sliding scale. Maximum 30 units uses only blood sugar >250mg /dl. Using 8-10 units when sugars are high lately.   Abigail Miyamoto, MD Taking Active   pyridOXINE (VITAMIN B-6) 50 MG tablet [id number] Yes Take 50 mg by mouth every other day. Takes the p5pb6 version [provider] Taking Active   TOUJEO SOLOSTAR 300 UNIT/ML Solostar Pen [id number] Yes Inject 23 Units into the skin daily.  Patient taking differently: Inject 20 Units into the skin in the morning and at bedtime. 20-25 units in the morning and 20-25 in the evening   Abigail Miyamoto, MD Taking Active   TRIAMCINOLONE ACETONIDE, TOP, 0.05 % OINT [id number] Yes Apply topically daily as needed. Uses as needed for rash, bug bites or on gums before dentist. [provider] Taking Active   Zinc 25 MG TABS [id number] Yes Take by mouth daily as needed. [provider] Taking Active              Assessment/Plan:   Diabetes: - Currently controlled - Recommend to continue current regimen  - Meets financial criteria for patient assistance program re-enrollments. Explained to patient that 2025 renewals usually don't open until mid-October or later, but can begin to compile documents. Patient wishes to send mychart attachments of her correspondence letters. Will collaborate with provider, CPhT, and patient to pursue assistance.  - Of note, patient requests triamcinolone ointment refill, will defer to PCP   Follow Up Plan: 2 weeks to assess PAP progress    Addendum 04/03/23 2:20pm -Assessed patient-entered attachments. Routed to rx med assistance team for 2025 renewal applications to be initiated. - Will keep scheduled follow up phone appointment with patient to discuss progress  Lynnda Shields, PharmD, BCPS Clinical Pharmacist [facility]

## 2023-04-09 NOTE — Progress Notes (Unsigned)
Subjective:  Patient ID: Suzanne Hardin, female    DOB: 1956-02-10  Age: 67 y.o. MRN: [id number]  Chief Complaint  Patient presents with   Medical Management of Chronic Issues    HPI Diabetes:  Complications: nephropathy Glucose checking: twice daily  Glucose logs: 105-115. Has been higher in the past 6 weeks due to prednisone.  Hypoglycemia: no Most recent A1C: 6.3% Current medications: Dulaglutide1.5 mg two times per week, Toujeo 10-12 units Twice a day. Used novolog while on prednisone. Started when her sugars went above 200.  Was on prednisone recently. 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   Hypertension: Current medications: None      02/12/2023    9:37 AM 09/06/2022   10:32 AM 08/17/2022    8:26 AM 05/15/2021   11:24 AM 02/10/2021    9:36 AM  Depression screen PHQ 2/9  Decreased Interest 0 0 0 0 0  Down, Depressed, Hopeless 0 0 0 0 0  PHQ - 2 Score 0 0 0 0 0        02/12/2023    9:39 AM  Fall Risk   Falls in the past year? 0  Number falls in past yr: 0  Injury with Fall? 0  Risk for fall due to : No Fall Risks  Follow up Falls prevention discussed    Patient Care Team: Blane Ohara, MD as PCP - General (Family Medicine) Marlowe Sax, RN as Case Manager (General Practice)   Review of Systems  Constitutional:  Negative for chills, fatigue and fever.  HENT:  Negative for congestion, ear pain, rhinorrhea and sore throat.   Respiratory:  Negative for cough and shortness of breath.   Cardiovascular:  Negative for chest pain.  Gastrointestinal:  Negative for abdominal pain, constipation, diarrhea, nausea and vomiting.  Genitourinary:  Negative for dysuria and urgency.  Musculoskeletal:  Negative for back pain and myalgias.  Neurological:  Negative for dizziness, weakness, light-headedness and headaches.  Psychiatric/Behavioral:  Negative for dysphoric  mood. The patient is not nervous/anxious.     Current Outpatient Medications on File Prior to Visit  Medication Sig Dispense Refill   Alpha-Lipoic Acid 300 MG CAPS Take 1 capsule by mouth daily.     baclofen (LIORESAL) 10 MG tablet Take 10 mg by mouth 3 (three) times daily as needed.     Blood Glucose Calibration (ONETOUCH VERIO) SOLN 1 each by In Vitro route every 30 (thirty) days. 1 each 2   Blood Glucose Monitoring Suppl (ONETOUCH VERIO) w/Device KIT 1 each by Does not apply route 3 (three) times daily. 1 kit 0   chlorpheniramine (CHLOR-TRIMETON) 4 MG tablet Take 1 mg by mouth daily.     Cholecalciferol (VITAMIN D) 125 MCG (5000 UT) CAPS Take 5,000 Units by mouth in the morning and at bedtime.     Cyanocobalamin 1000 MCG TBCR Take 1 tablet by mouth every other day.     D-Mannose POWD Take by mouth as needed. (Patient not taking: Reported on 03/28/2023)     Dulaglutide 1.5 MG/0.5ML SOPN Inject 1.5 mg into the skin 2 (two) times a week.     hydrOXYzine (ATARAX) 10 MG/5ML syrup Take by mouth.     ibuprofen (ADVIL) 200 MG tablet Take 200 mg by mouth every 6 (six) hours as needed.     Lancets MISC by Does not apply route.     NOVOLOG FLEXPEN 100  UNIT/ML FlexPen Patient uses slidding scale. Maximum 30 units uses only blood sugar >250mg /dl. Using 8-10 units when sugars are high lately. (Patient taking differently: Patient uses sliding scale. Maximum 30 units uses only blood sugar >250mg /dl. Using 8-10 units when sugars are high lately.) 15 mL 1   pyridOXINE (VITAMIN B-6) 50 MG tablet Take 50 mg by mouth every other day. Takes the p5pb6 version     TOUJEO SOLOSTAR 300 UNIT/ML Solostar Pen Inject 23 Units into the skin daily. (Patient taking differently: Inject 20 Units into the skin in the morning and at bedtime. 10-12 units in the morning and 10-12 in the evening) 6 pen 6   TRIAMCINOLONE ACETONIDE, TOP, 0.05 % OINT Apply topically daily as needed. Uses as needed for rash, bug bites or on gums before  dentist.     Zinc 25 MG TABS Take by mouth daily as needed.     No current facility-administered medications on file prior to visit.   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 Food, (peanuts, bell peppers, raw onions,pickles) Drug, and Skin, environmental, fragrances & air freshners   Type 2 diabetes mellitus with other specified complication (HCC) 09/16/2019   Past Surgical History:  Procedure Laterality Date   APPENDECTOMY  2000   CATARACT EXTRACTION, BILATERAL     CHOLECYSTECTOMY  2000   TONSILLECTOMY AND ADENOIDECTOMY      Family History  Problem Relation Age of Onset   Ulcers Father    Peripheral Artery Disease Father    Breast cancer Maternal Aunt    Breast cancer Maternal Aunt    Breast cancer Cousin    Social History   Socioeconomic History   Marital status: Divorced    Spouse name: Not on file   Number of children: 2   Years of education: Not on file   Highest education level: Not on file  Occupational History   Occupation: Disabled  Tobacco Use   Smoking status: Never   Smokeless tobacco: Never  Vaping Use   Vaping status: Never Used  Substance and Sexual Activity   Alcohol use: Never   Drug use: Never   Sexual activity: Yes    Partners: Male  Other Topics Concern   Not on file  Social History Narrative   Not on file   Social Determinants of Health   Financial Resource Strain: Low Risk  (02/12/2023)   Overall Financial Resource Strain (CARDIA)    Difficulty of Paying Living Expenses: Not hard at all  Food Insecurity: No Food Insecurity (02/12/2023)   Hunger  Vital Sign    Worried About Running Out of Food in the Last Year: Never true    Ran Out of Food in the Last Year: Never true  Transportation Needs: No Transportation Needs (02/12/2023)   PRAPARE - Administrator, Civil Service (Medical): No    Lack of Transportation (Non-Medical): No  Physical Activity: Sufficiently Active (02/12/2023)   Exercise Vital Sign    Days of Exercise per Week: 5 days    Minutes of Exercise per Session: 150+ min  Stress: No Stress Concern  Present (02/12/2023)   [employer] of Occupational Health - Occupational Stress Questionnaire    Feeling of Stress : Not at all  Social Connections: Moderately Integrated (03/25/2023)   Social Connection and Isolation Panel [NHANES]    Frequency of Communication with Friends and Family: More than three times a week    Frequency of Social Gatherings with Friends and Family: More than three times a week    Attends Religious Services: More than 4 times per year    Active Member of [employer] or Organizations: Yes    Attends Banker Meetings: 1 to 4 times per year    Marital Status: Divorced    Objective:  BP 136/80   Pulse 76   Temp 97.6 F (36.4 C)   Ht 5' 6.75" (1.695 m)   Wt 188 lb (85.3 kg)   SpO2 100%   BMI 29.67 kg/m      04/10/2023    8:21 AM 03/05/2023    9:52 AM 02/12/2023    9:31 AM  BP/Weight  Systolic BP 136 138 --  Diastolic BP 80 72 --  Wt. (Lbs) 188 192.8 185  BMI 29.67 kg/m2 30.2 kg/m2 28.98 kg/m2    Physical Exam Vitals reviewed.  Constitutional:      Appearance: Normal appearance. She is normal weight.  HENT:     Right Ear: Tympanic membrane normal.     Left Ear: Tympanic membrane normal.     Nose: Congestion (P[place] SWOLLEN) and rhinorrhea present.     Mouth/Throat:     Pharynx: No oropharyngeal exudate or posterior oropharyngeal erythema.  Neck:     Vascular: No carotid bruit.  Cardiovascular:     Rate and Rhythm: Normal rate and regular rhythm.     Pulses:  Normal pulses.     Heart sounds: Normal heart sounds.  Pulmonary:     Effort: Pulmonary effort is normal. No respiratory distress.     Breath sounds: Normal breath sounds.  Abdominal:     General: Abdomen is flat. Bowel sounds are normal.     Palpations: Abdomen is soft.     Tenderness: There is no abdominal tenderness.  Neurological:     Mental Status: She is alert and oriented to person, place, and time.  Psychiatric:        Mood and Affect: Mood normal.        Behavior: Behavior normal.     Diabetic Foot Exam - Simple   Simple Foot Form Diabetic Foot exam was performed with the following findings: Yes 04/10/2023  9:06 AM  Visual Inspection No deformities, no ulcerations, no other skin breakdown bilaterally: Yes Sensation Testing Intact to touch and monofilament testing bilaterally: Yes Pulse Check Posterior Tibialis and Dorsalis pulse intact bilaterally: Yes Comments      Lab Results  Component Value Date   WBC 10.0 04/10/2023   HGB 15.3 04/10/2023   HCT 45.8 04/10/2023   PLT 326 04/10/2023   GLUCOSE 145 (H) 04/10/2023   CHOL 210 (H) 04/10/2023   TRIG 87 04/10/2023   HDL 69 04/10/2023   LDLCALC 126 (H) 04/10/2023   ALT 18 04/10/2023   AST 22 04/10/2023   NA 143 04/10/2023   K 4.3 04/10/2023   CL 102 04/10/2023   CREATININE 0.96 04/10/2023   BUN 15 04/10/2023   CO2 26 04/10/2023   TSH 2.560 12/28/2020   HGBA1C 7.8 (H) 04/10/2023   MICROALBUR 80 12/28/2020      Assessment & Plan:    Moderate  persistent asthma without complication Assessment & Plan: stable  Orders: -     Albuterol Sulfate HFA; Inhale 2 puffs into the lungs every 6 (six) hours as needed for wheezing or shortness of breath.  Essential hypertension Assessment & Plan: Controlled off medicines. Continue to work on eating a healthy diet and exercise.  Labs drawn today.    Orders: -     Comprehensive metabolic panel  Diabetic glomerulopathy (HCC) Assessment & Plan: Comorbidities:  hyperlipidemia and hypertension. Control: controlled Recommend check sugars fasting daily. Recommend check feet daily. Recommend annual eye exams. Medicines: Dulaglutide1.5 mg two times per week, Toujeo 10-12 units Twice a day. Continue to work on eating a healthy diet and exercise.  Labs drawn today.     Orders: -     Microalbumin / creatinine urine ratio -     CBC with Differential/Platelet -     Hemoglobin A1c -     Contour Next Test; CHECK SUGARS TWICE A DAY.  Dispense: 300 strip; Refill: 3  Mixed hyperlipidemia Assessment & Plan: Well controlled.  No medicines.  Continue to work on eating a healthy diet and exercise.  Labs drawn today.    Orders: -     Lipid panel  Vitamin D deficiency Assessment & Plan: The current medical regimen is effective;  continue present plan and medications.       Meds ordered this encounter  Medications   DISCONTD: glucose blood (CONTOUR NEXT TEST) test strip    Sig: CHECK SUGARS TWICE A DAY.    Dispense:  300 strip    Refill:  3   albuterol (PROAIR HFA) 108 (90 Base) MCG/ACT inhaler    Sig: Inhale 2 puffs into the lungs every 6 (six) hours as needed for wheezing or shortness of breath.   glucose blood (CONTOUR NEXT TEST) test strip    Sig: CHECK SUGARS TWICE A DAY.    Dispense:  300 strip    Refill:  3    Orders Placed This Encounter  Procedures   Microalbumin / creatinine urine ratio   CBC with Differential/Platelet   Comprehensive metabolic panel   Lipid panel   Hemoglobin A1c     Follow-up: Return in about 6 months (around 10/09/2023) for chronic follow up.   I,Katherina A Bramblett,acting as a scribe for Blane Ohara, MD.,have documented all relevant documentation on the behalf of Blane Ohara, MD,as directed by  Blane Ohara, MD while in the presence of Blane Ohara, MD.   Clayborn Bigness I Leal-Borjas,acting as a scribe for Blane Ohara, MD.,have documented all relevant documentation on the behalf of Blane Ohara, MD,as directed by   Blane Ohara, MD while in the presence of Blane Ohara, MD.    An After Visit Summary was printed and given to the patient.  I attest that I have reviewed this visit and agree with the plan scribed by my staff.   Blane Ohara, MD [facility] [phone]

## 2023-04-10 ENCOUNTER — Encounter: Payer: Self-pay | Admitting: Family Medicine

## 2023-04-10 ENCOUNTER — Ambulatory Visit (INDEPENDENT_AMBULATORY_CARE_PROVIDER_SITE_OTHER): Payer: Medicare Other | Admitting: Family Medicine

## 2023-04-10 VITALS — BP 136/80 | HR 76 | Temp 97.6°F | Ht 66.75 in | Wt 188.0 lb

## 2023-04-10 DIAGNOSIS — E1121 Type 2 diabetes mellitus with diabetic nephropathy: Secondary | ICD-10-CM

## 2023-04-10 DIAGNOSIS — E1169 Type 2 diabetes mellitus with other specified complication: Secondary | ICD-10-CM

## 2023-04-10 DIAGNOSIS — I10 Essential (primary) hypertension: Secondary | ICD-10-CM

## 2023-04-10 DIAGNOSIS — E559 Vitamin D deficiency, unspecified: Secondary | ICD-10-CM

## 2023-04-10 DIAGNOSIS — E782 Mixed hyperlipidemia: Secondary | ICD-10-CM | POA: Diagnosis not present

## 2023-04-10 DIAGNOSIS — J4540 Moderate persistent asthma, uncomplicated: Secondary | ICD-10-CM | POA: Diagnosis not present

## 2023-04-10 MED ORDER — CONTOUR NEXT TEST VI STRP
ORAL_STRIP | 3 refills | Status: DC
Start: 2023-04-10 — End: 2023-04-10

## 2023-04-10 MED ORDER — CONTOUR NEXT TEST VI STRP
ORAL_STRIP | 3 refills | Status: DC
Start: 2023-04-10 — End: 2023-04-30

## 2023-04-10 MED ORDER — ALBUTEROL SULFATE HFA 108 (90 BASE) MCG/ACT IN AERS: 2.0000 | INHALATION_SPRAY | Freq: Four times a day (QID) | RESPIRATORY_TRACT | Status: DC | PRN

## 2023-04-10 NOTE — Patient Instructions (Signed)
AIRSUPRA.

## 2023-04-11 LAB — CBC WITH DIFFERENTIAL/PLATELET
Basophils Absolute: 0.1 10*3/uL (ref 0.0–0.2)
Basos: 1 %
EOS (ABSOLUTE): 0.5 10*3/uL — ABNORMAL HIGH (ref 0.0–0.4)
Eos: 5 %
Hematocrit: 45.8 % (ref 34.0–46.6)
Hemoglobin: 15.3 g/dL (ref 11.1–15.9)
Immature Grans (Abs): 0 10*3/uL (ref 0.0–0.1)
Immature Granulocytes: 0 %
Lymphocytes Absolute: 4 10*3/uL — ABNORMAL HIGH (ref 0.7–3.1)
Lymphs: 40 %
MCH: 30.2 pg (ref 26.6–33.0)
MCHC: 33.4 g/dL (ref 31.5–35.7)
MCV: 90 fL (ref 79–97)
Monocytes Absolute: 0.4 10*3/uL (ref 0.1–0.9)
Monocytes: 4 %
Neutrophils Absolute: 5 10*3/uL (ref 1.4–7.0)
Neutrophils: 50 %
Platelets: 326 10*3/uL (ref 150–450)
RBC: 5.07 x10E6/uL (ref 3.77–5.28)
RDW: 12.7 % (ref 11.7–15.4)
WBC: 10 10*3/uL (ref 3.4–10.8)

## 2023-04-11 LAB — COMPREHENSIVE METABOLIC PANEL
ALT: 18 [IU]/L (ref 0–32)
AST: 22 [IU]/L (ref 0–40)
Albumin: 4.5 g/dL (ref 3.9–4.9)
Alkaline Phosphatase: 95 [IU]/L (ref 44–121)
BUN/Creatinine Ratio: 16 (ref 12–28)
BUN: 15 mg/dL (ref 8–27)
Bilirubin Total: 0.7 mg/dL (ref 0.0–1.2)
CO2: 26 mmol/L (ref 20–29)
Calcium: 10 mg/dL (ref 8.7–10.3)
Chloride: 102 mmol/L (ref 96–106)
Creatinine, Ser: 0.96 mg/dL (ref 0.57–1.00)
Globulin, Total: 2.4 g/dL (ref 1.5–4.5)
Glucose: 145 mg/dL — ABNORMAL HIGH (ref 70–99)
Potassium: 4.3 mmol/L (ref 3.5–5.2)
Sodium: 143 mmol/L (ref 134–144)
Total Protein: 6.9 g/dL (ref 6.0–8.5)
eGFR: 65 mL/min/{1.73_m2} (ref >59)

## 2023-04-11 LAB — LIPID PANEL
Chol/HDL Ratio: 3 {ratio} (ref 0.0–4.4)
Cholesterol, Total: 210 mg/dL — ABNORMAL HIGH (ref 100–199)
HDL: 69 mg/dL (ref >39)
LDL Chol Calc (NIH): 126 mg/dL — ABNORMAL HIGH (ref 0–99)
Triglycerides: 87 mg/dL (ref 0–149)
VLDL Cholesterol Cal: 15 mg/dL (ref 5–40)

## 2023-04-11 LAB — HEMOGLOBIN A1C
Est. average glucose Bld gHb Est-mCnc: 177 mg/dL
Hgb A1c MFr Bld: 7.8 % — ABNORMAL HIGH (ref 4.8–5.6)

## 2023-04-11 LAB — MICROALBUMIN / CREATININE URINE RATIO
Creatinine, Urine: 211.6 mg/dL
Microalb/Creat Ratio: 47 mg/g{creat} — ABNORMAL HIGH (ref 0–29)
Microalbumin, Urine: 100.4 ug/mL

## 2023-04-13 DIAGNOSIS — E119 Type 2 diabetes mellitus without complications: Secondary | ICD-10-CM | POA: Insufficient documentation

## 2023-04-13 NOTE — Assessment & Plan Note (Signed)
Comorbidities: hyperlipidemia and hypertension. Control: controlled Recommend check sugars fasting daily. Recommend check feet daily. Recommend annual eye exams. Medicines: Dulaglutide1.5 mg two times per week, Toujeo 10-12 units Twice a day. Continue to work on eating a healthy diet and exercise.  Labs drawn today.

## 2023-04-13 NOTE — Assessment & Plan Note (Signed)
stable °

## 2023-04-13 NOTE — Assessment & Plan Note (Signed)
Controlled off medicines.  Continue to work on eating a healthy diet and exercise.  Labs drawn today.  

## 2023-04-13 NOTE — Assessment & Plan Note (Signed)
The current medical regimen is effective;  continue present plan and medications.  

## 2023-04-13 NOTE — Assessment & Plan Note (Signed)
Well controlled.  No medicines.  Continue to work on eating a healthy diet and exercise.  Labs drawn today.   

## 2023-04-13 NOTE — Assessment & Plan Note (Addendum)
>>  ASSESSMENT AND PLAN FOR DIABETIC GLOMERULOPATHY (HCC) WRITTEN ON 04/13/2023  2:47 PM BY LEAL-BORJAS, Khaiden Segreto I, CMA  Comorbidities: hyperlipidemia and hypertension. Control: controlled Recommend check sugars fasting daily. Recommend check feet daily. Recommend annual eye exams. Medicines: Dulaglutide1.5 mg two times per week, Toujeo 10-12 units Twice a day. Continue to work on eating a healthy diet and exercise.  Labs drawn today.      >>ASSESSMENT AND PLAN FOR DIABETES MELLITUS (HCC) WRITTEN ON 04/13/2023  2:47 PM BY LEAL-BORJAS, Adiel Erney I, CMA  Comorbidities: hyperlipidemia and hypertension. Control: controlled Recommend check sugars fasting daily. Recommend check feet daily. Recommend annual eye exams. Medicines: Dulaglutide1.5 mg two times per week, Toujeo 10-12 units Twice a day. Continue to work on eating a healthy diet and exercise.  Labs drawn today.

## 2023-04-15 ENCOUNTER — Other Ambulatory Visit (HOSPITAL_COMMUNITY): Payer: Self-pay

## 2023-04-15 ENCOUNTER — Telehealth: Payer: Self-pay

## 2023-04-15 NOTE — Telephone Encounter (Signed)
PAP: PAP application for Toujeo, [employer]) has been mailed to pt's home address on file. Will fax provider portion of application to provider's office when pt's portion is received.   PAP: PAP application for Trulicity, [employer]) has been mailed to pt's home address on file. Will fax provider portion of application to provider's office when pt's portion is received.  PAP: Application for Novolog and pen needles has been submitted to PAP Companies: NovoNordisk, Therapist, sports

## 2023-04-16 ENCOUNTER — Other Ambulatory Visit: Payer: Self-pay

## 2023-04-16 DIAGNOSIS — Z13820 Encounter for screening for osteoporosis: Secondary | ICD-10-CM

## 2023-04-18 ENCOUNTER — Other Ambulatory Visit: Payer: Self-pay | Admitting: Pharmacist

## 2023-04-18 NOTE — Progress Notes (Signed)
04/18/2023 Name: Suzanne Hardin MRN: [id number] DOB: 02/26/1956  Chief Complaint  Patient presents with   Diabetes   Medication Assistance    Suzanne Hardin is a 67 y.o. year old female who presented for a telephone visit.   They were referred to the pharmacist by their PCP for assistance in managing diabetes and medication access. - trulicity, toujeo, novolog are obtained via patient assistance programs and patient is in process with 2025 renewals.  Subjective:  Care Team: Primary Care Provider: Blane Ohara, MD   Medication Access/Adherence  Current Pharmacy:  [facility] [place], [place] - [address] [place] [address] Phone: [phone] Fax: [phone]  [id number] [place], [place] - [address] [place] [address] Phone: [phone] Fax: [phone]   Diabetes:  Current medications: trulicity 1.5mg  two times per week (Mon, Thurs), novolog uses PRN for BG >250, toujeo 16 units AM and PM.   Current glucose readings: 100s fasting, 142 at 1.5 hrs postprandial. She reports her blood glucose readings have since lowered after steroid completed Using contour traditional meter  Current medication access support: trulicity (lillycares), toujeo (sanofi), novolog (novonordisk)   Objective:  Lab Results  Component Value Date   HGBA1C 7.8 (H) 04/10/2023    Lab Results  Component Value Date   CREATININE 0.96 04/10/2023   BUN 15 04/10/2023   NA 143 04/10/2023   K 4.3 04/10/2023   CL 102 04/10/2023   CO2 26 04/10/2023    Lab Results  Component Value Date   CHOL 210 (H) 04/10/2023   HDL 69 04/10/2023   LDLCALC 126 (H) 04/10/2023   TRIG 87 04/10/2023   CHOLHDL 3.0 04/10/2023    Medications Reviewed Today     Reviewed by Gabriel Carina, RPH (Pharmacist) on 04/18/23 at [phone]  Med List Status: <None>   Medication Order Taking? Sig  Documenting Provider Last Dose Status Informant  albuterol (PROAIR HFA) 108 (90 Base) MCG/ACT inhaler [id number]  Inhale 2 puffs into the lungs every 6 (six) hours as needed for wheezing or shortness of breath. Blane Ohara, MD  Active   Alpha-Lipoic Acid 300 MG CAPS [id number] No Take 1 capsule by mouth daily. [provider] Taking Active   baclofen (LIORESAL) 10 MG tablet [id number] No Take 10 mg by mouth 3 (three) times daily as needed. [provider] Taking Active   Blood Glucose Calibration (ONETOUCH VERIO) SOLN [id number] No 1 each by In Vitro route every 30 (thirty) days. Abigail Miyamoto, MD Taking Active   Blood Glucose Monitoring Suppl [facility] VERIO) w/Device Andria Rhein [id number] No 1 each by Does not apply route 3 (three) times daily. Abigail Miyamoto, MD Taking Active   chlorpheniramine (CHLOR-TRIMETON) 4 MG tablet [id number] No Take 1 mg by mouth daily. [provider] Taking Active   Cholecalciferol (VITAMIN D) 125 MCG (5000 UT) CAPS [id number] No Take 5,000 Units by mouth in the morning and at bedtime. [provider] Taking Active   Cyanocobalamin 1000 MCG TBCR [id number] No Take 1 tablet by mouth every other day. [provider] Taking Active   D-Mannose POWD [id number] No Take by mouth as needed.  Patient not taking: Reported on 03/28/2023   [provider] Not Taking Active   Dulaglutide 1.5 MG/0.5ML SOPN [id number] No Inject 1.5 mg into the skin 2 (two) times a week. [provider] Taking Active  Med Note Clearance Coots, Mel Almond Sep 24, 2022  9:14 AM) Taking Mondays and Thursdays  glucose blood (CONTOUR NEXT TEST) test strip [id number]  CHECK SUGARS TWICE A DAY. Cox, Kirsten, MD  Active   hydrOXYzine (ATARAX) 10 MG/5ML syrup [id number] No Take by mouth. [provider] Taking Active            Med Note Tyler Deis Mar 28, 2023 10:24 AM) Only as needed for food allergy reactions   ibuprofen (ADVIL) 200 MG tablet [id number] No Take 200 mg by mouth every 6 (six) hours as needed. [provider] Taking Active   Lancets MISC [id number] No by Does not apply route. [provider] Taking Active   NOVOLOG FLEXPEN 100 UNIT/ML FlexPen [id number] No Patient uses slidding scale. Maximum 30 units uses only blood sugar >250mg /dl. Using 8-10 units when sugars are high lately.  Patient taking differently: Patient uses sliding scale. Maximum 30 units uses only blood sugar >250mg /dl. Using 8-10 units when sugars are high lately.   Abigail Miyamoto, MD Taking Active   pyridOXINE (VITAMIN B-6) 50 MG tablet [id number] No Take 50 mg by mouth every other day. Takes the p5pb6 version [provider] Taking Active   TOUJEO SOLOSTAR 300 UNIT/ML Solostar Pen [id number] No Inject 23 Units into the skin daily.  Patient taking differently: Inject 20 Units into the skin in the morning and at bedtime. 10-12 units in the morning and 10-12 in the evening   Abigail Miyamoto, MD Taking Active   TRIAMCINOLONE ACETONIDE, TOP, 0.05 % OINT [id number] No Apply topically daily as needed. Uses as needed for rash, bug bites or on gums before dentist. [provider] Taking Active   Zinc 25 MG TABS [id number] No Take by mouth daily as needed. [provider] Taking Active              Assessment/Plan:   Diabetes: - Currently uncontrolled, A1c uptrend attributed to steroid use, which is now completed. Patient is hesitant to change any medication doses or add medications since glucose readings have since lowered. - Recommend to continue current regimen for now, discussed risk/benefit of medications and agree with PCP that options are to increase trulicity dosing, and/or add farxiga. Patient is limited by allergies & intolerances, so likely option is to increase trulicity dose in future if needed. - Patient assistance program applications are in process. Updated  patient, continue to monitor for updates.   Follow Up Plan: no follow up scheduled, directed patient to utilize manufacturer contact phone numbers for updates as well as rx med assistance team for paperwork processing. Recommend outreach with further updates when available.  Lynnda Shields, PharmD, BCPS Clinical Pharmacist [facility]

## 2023-04-23 ENCOUNTER — Other Ambulatory Visit: Payer: Self-pay | Admitting: Family Medicine

## 2023-04-23 MED ORDER — AIRSUPRA 90-80 MCG/ACT IN AERO: 2.0000 | INHALATION_SPRAY | Freq: Four times a day (QID) | RESPIRATORY_TRACT | 5 refills | Status: DC | PRN

## 2023-04-24 ENCOUNTER — Other Ambulatory Visit (HOSPITAL_COMMUNITY): Payer: Self-pay

## 2023-04-24 NOTE — Telephone Encounter (Signed)
Received applications back from pt, faxing provider portions to office of Dr. Sedalia Muta, need to confirm current Trulicity dosing

## 2023-04-28 ENCOUNTER — Other Ambulatory Visit: Payer: Self-pay | Admitting: Family Medicine

## 2023-04-28 DIAGNOSIS — E1121 Type 2 diabetes mellitus with diabetic nephropathy: Secondary | ICD-10-CM

## 2023-05-01 ENCOUNTER — Telehealth: Payer: Self-pay | Admitting: Family Medicine

## 2023-05-01 ENCOUNTER — Other Ambulatory Visit: Payer: Self-pay | Admitting: Family Medicine

## 2023-05-01 NOTE — Telephone Encounter (Signed)
PT DROPPED OFF PAPERS WITH INFORMATION ABOUT A ICD10 CODE FOR [facility] USE AND INFORMATION WITH MEDICATION THAT HER INSURANCE COVERS

## 2023-05-02 ENCOUNTER — Other Ambulatory Visit: Payer: Self-pay | Admitting: Family Medicine

## 2023-05-03 ENCOUNTER — Encounter: Payer: Self-pay | Admitting: Family Medicine

## 2023-05-05 ENCOUNTER — Other Ambulatory Visit: Payer: Self-pay | Admitting: Family Medicine

## 2023-05-05 MED ORDER — ALBUTEROL SULFATE HFA 108 (90 BASE) MCG/ACT IN AERS: 2.0000 | INHALATION_SPRAY | Freq: Four times a day (QID) | RESPIRATORY_TRACT | 2 refills | Status: DC | PRN

## 2023-05-06 ENCOUNTER — Other Ambulatory Visit: Payer: Self-pay | Admitting: Family Medicine

## 2023-05-06 MED ORDER — VENTOLIN HFA 108 (90 BASE) MCG/ACT IN AERS: 2.0000 | INHALATION_SPRAY | Freq: Four times a day (QID) | RESPIRATORY_TRACT | 3 refills | Status: DC | PRN

## 2023-05-07 ENCOUNTER — Other Ambulatory Visit (HOSPITAL_COMMUNITY): Payer: Self-pay

## 2023-05-07 ENCOUNTER — Other Ambulatory Visit: Payer: Self-pay

## 2023-05-07 NOTE — Telephone Encounter (Signed)
Patient aware patient assistance for Trulicity is here for pick up. States she will pick up tomorrow morning.

## 2023-05-08 NOTE — Telephone Encounter (Signed)
The patient came by the office today and picked up her Trulicity. A signed copy of there Trulicity papers has been put into [place], [employer].

## 2023-05-08 NOTE — Telephone Encounter (Signed)
Have faxed provider portion of LillyCares application

## 2023-05-08 NOTE — Telephone Encounter (Signed)
As of right now I have not received a PA.

## 2023-05-08 NOTE — Telephone Encounter (Signed)
PA has been completed and faxed. 

## 2023-05-10 NOTE — Telephone Encounter (Signed)
Received provider portion and faxed [facility] app to company

## 2023-05-13 NOTE — Telephone Encounter (Signed)
Called and spoke to pt, they are also requiring assistance with Airsupra inhaler as they are trying to charge over 500 dollars. E-signing pt's portion with pt permission and faxing pcp portion to Dr. Sedalia Muta.

## 2023-05-15 NOTE — Telephone Encounter (Signed)
Don't see that there is a previous note for Sanofi application, this was faxed 05/07/2023

## 2023-05-18 NOTE — Telephone Encounter (Signed)
Need fax number for [facility]  [address] [place], [place] [address] Phone: [phone]

## 2023-05-20 ENCOUNTER — Encounter: Payer: Self-pay | Admitting: Family Medicine

## 2023-05-20 ENCOUNTER — Other Ambulatory Visit: Payer: Self-pay

## 2023-05-20 ENCOUNTER — Ambulatory Visit (INDEPENDENT_AMBULATORY_CARE_PROVIDER_SITE_OTHER): Payer: Medicare Other | Admitting: Family Medicine

## 2023-05-20 VITALS — BP 160/88 | HR 91 | Temp 98.0°F | Resp 16 | Ht 66.75 in | Wt 192.8 lb

## 2023-05-20 DIAGNOSIS — R109 Unspecified abdominal pain: Secondary | ICD-10-CM

## 2023-05-20 DIAGNOSIS — N3001 Acute cystitis with hematuria: Secondary | ICD-10-CM | POA: Diagnosis not present

## 2023-05-20 DIAGNOSIS — R030 Elevated blood-pressure reading, without diagnosis of hypertension: Secondary | ICD-10-CM | POA: Diagnosis not present

## 2023-05-20 HISTORY — DX: Unspecified abdominal pain: R10.9

## 2023-05-20 LAB — POCT URINALYSIS DIP (CLINITEK)
Bilirubin, UA: NEGATIVE
Glucose, UA: NEGATIVE mg/dL
Ketones, POC UA: NEGATIVE mg/dL
Nitrite, UA: NEGATIVE
POC PROTEIN,UA: NEGATIVE
Spec Grav, UA: 1.01
Urobilinogen, UA: 0.2 U/dL
pH, UA: 6

## 2023-05-20 MED ORDER — DOXYCYCLINE HYCLATE 100 MG PO TABS: 100.0000 mg | ORAL_TABLET | Freq: Two times a day (BID) | ORAL | 0 refills | Status: DC

## 2023-05-20 NOTE — Assessment & Plan Note (Signed)
Brief episode of right-sided flank pain, not currently present. No other symptoms suggestive of kidney stone. -Monitor for recurrence of pain, consider imaging if symptoms persist.

## 2023-05-20 NOTE — Progress Notes (Signed)
Acute Office Visit  Subjective:     Patient ID: Suzanne Hardin, female    DOB: 1956-02-13, 67 y.o.   MRN: [id number]  Chief Complaint  Patient presents with   Urinary Tract Infection   HPI Urinary Tract Infection  Associated symptoms include urgency. Pertinent negatives include no chills, frequency, nausea or vomiting.   The patient, with a complex medical history including multiple drug allergies and diabetes, presents with symptoms suggestive of a urinary tract infection (UTI). They report having had recurrent UTIs in the past, but have not had one in a long time. The patient describes a heavy feeling in the bladder and increased urgency to urinate. They also report fatigue and a brief episode of lower back pain, which they suspect might be a kidney stone. 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 They also mention having a history of allergic reactions to many antibiotics and have specific brands that they can tolerate. She has not tried anything yet and reports she used to take D-Mannose and will start taking it again with cranberry juice (sugar free).  Review of Systems  Constitutional:  Positive for malaise/fatigue. Negative for chills and fever.  Respiratory:  Negative for shortness of breath.   Cardiovascular:  Negative for chest pain.  Gastrointestinal:  Positive for abdominal pain (lower). Negative for nausea and vomiting.  Genitourinary:  Positive for urgency. Negative for dysuria and frequency.  Musculoskeletal:  Positive for back pain (right sided).  Skin:  Negative for rash.  Neurological:  Negative for headaches.        Objective:    BP (!) 160/88 (BP Location: Left Arm, Patient Position: Sitting)   Pulse 91   Temp 98 F (36.7 C) (Temporal)   Resp 16   Ht 5' 6.75" (1.695 m)   Wt 192 lb 12.8 oz (87.5 kg)   SpO2 99%   BMI 30.42 kg/m  BP Readings from Last 3 Encounters:  05/20/23 (!) 160/88   04/10/23 136/80  03/05/23 138/72   Wt Readings from Last 3 Encounters:  05/20/23 192 lb 12.8 oz (87.5 kg)  04/10/23 188 lb (85.3 kg)  03/05/23 192 lb 12.8 oz (87.5 kg)      Physical Exam Constitutional:      General: She is not in acute distress.    Appearance: Normal appearance. She is not ill-appearing.  Neck:     Vascular: No carotid bruit.  Cardiovascular:     Rate and Rhythm: Normal rate and regular rhythm.     Heart sounds: Normal heart sounds.  Pulmonary:     Effort: Pulmonary effort is normal.     Breath sounds: Normal breath sounds. No wheezing.  Abdominal:     General: Bowel sounds are normal.     Palpations: Abdomen is soft.     Tenderness: There is no abdominal tenderness.  Skin:    General: Skin is warm.  Neurological:     Mental Status: She is alert. Mental status is at baseline.  Psychiatric:        Mood and Affect: Affect is tearful.     Results for orders placed or performed in visit on 05/20/23  POCT URINALYSIS DIP (CLINITEK)  Result Value Ref Range   Color, UA yellow yellow   Clarity, UA cloudy (A) clear   Glucose, UA negative negative mg/dL   Bilirubin, UA negative negative   Ketones, POC UA negative negative mg/dL   Spec Grav,  UA 1.010 1.010 - 1.025   Blood, UA trace-intact (A) negative   pH, UA 6.0 5.0 - 8.0   POC PROTEIN,UA negative negative, trace   Urobilinogen, UA 0.2 0.2 or 1.0 E.U./dL   Nitrite, UA Negative Negative   Leukocytes, UA Small (1+) (A) Negative        Assessment & Plan:   Problem List Items Addressed This Visit       Genitourinary   Acute cystitis with hematuria - Primary    Positive for UTI with symptoms since Friday. Noted blood in urine. Multiple drug allergies. Doxycycline tolerated in the past but requires specific brand due to inactive ingredient allergies. -Send urine for culture. -Doxycycline 100 mg by mouth TWICE A DAY for 7 days. -Your symptoms should gradually improve. Call if the burning worsens, you  develop a fever, back pain or pelvic pain or if your symptom do not resolve after completing the antibiotic. -Drink plenty of fluids -Complete the full course of antibiotics even if the symptoms resolve -Remember to wipe from front to back and don't hold it in! If possible, empty your bladder every 4 hours.          Relevant Medications   doxycycline (VIBRA-TABS) 100 MG tablet   Other Relevant Orders   POCT URINALYSIS DIP (CLINITEK) (Completed)   Urine Culture     Other   Acute right flank pain    Brief episode of right-sided flank pain, not currently present. No other symptoms suggestive of kidney stone. -Monitor for recurrence of pain, consider imaging if symptoms persist.         Elevated blood pressure reading    -Patient is dealing with a very stressful situation and dealing with emotional stress causing her BP to be elevated 160/82, 160/88 -She has many positive coping skills since she is unable to take any medications for anxiety/depression. -She does have a support system to talk to when she needs to talk. -Encourage continued use of coping mechanisms such as walking and painting.       Meds ordered this encounter  Medications   DISCONTD: doxycycline (VIBRA-TABS) 100 MG tablet    Sig: Take 1 tablet (100 mg total) by mouth 2 (two) times daily for 7 days.    Dispense:  14 tablet    Refill:  0   doxycycline (VIBRA-TABS) 100 MG tablet    Sig: Take 1 tablet (100 mg total) by mouth 2 (two) times daily for 7 days.    Dispense:  14 tablet    Refill:  0    Preferred generic as last brand prescribed due to allergies    Follow-up: Return if symptoms worsen or fail to improve.  Total time spent on today's visit was greater than 40 minutes, including both face-to-face time and nonface-to-face time personally spent on review of chart (labs and imaging), discussing labs and goals, discussing further work-up, treatment options, referrals to specialist if needed, reviewing  outside records of pertinent, answering patient's questions, and coordinating care.   Lajuana Matte, FNP [facility] [phone]

## 2023-05-20 NOTE — Assessment & Plan Note (Addendum)
Positive for UTI with symptoms since Friday. Noted blood in urine. Multiple drug allergies. Doxycycline tolerated in the past but requires specific brand due to inactive ingredient allergies. -Send urine for culture. -Doxycycline 100 mg by mouth TWICE A DAY for 7 days. -Your symptoms should gradually improve. Call if the burning worsens, you develop a fever, back pain or pelvic pain or if your symptom do not resolve after completing the antibiotic. -Drink plenty of fluids -Complete the full course of antibiotics even if the symptoms resolve -Remember to wipe from front to back and don't hold it in! If possible, empty your bladder every 4 hours.

## 2023-05-20 NOTE — Assessment & Plan Note (Addendum)
-  Patient is dealing with a very stressful situation and dealing with emotional stress causing her BP to be elevated 160/82, 160/88 -She has many positive coping skills since she is unable to take any medications for anxiety/depression. -She does have a support system to talk to when she needs to talk. -Encourage continued use of coping mechanisms such as walking and painting.

## 2023-05-23 LAB — URINE CULTURE

## 2023-05-26 ENCOUNTER — Other Ambulatory Visit: Payer: Self-pay | Admitting: Family Medicine

## 2023-05-26 DIAGNOSIS — N3001 Acute cystitis with hematuria: Secondary | ICD-10-CM

## 2023-05-30 LAB — HM DEXA SCAN: HM Dexa Scan: NORMAL

## 2023-05-30 NOTE — Telephone Encounter (Signed)
Received provider portion of application for [employer] Paulene Floor), faxed completed app to [employer],

## 2023-06-06 ENCOUNTER — Other Ambulatory Visit: Payer: Self-pay | Admitting: Pharmacist

## 2023-06-06 NOTE — Progress Notes (Signed)
06/06/2023 Name: Suzanne Hardin MRN: [id number] DOB: 1956/02/20  Chief Complaint  Patient presents with   Diabetes    Suzanne Hardin is a 67 y.o. year old female who presented for a telephone visit.   They were referred to the pharmacist by their PCP for assistance in managing diabetes and medication access.  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 [place] [address] Phone: [phone] Fax: [phone]  [id number] - [place], [place] - [address] [place] [address] Phone: [phone] Fax: [phone]   Diabetes:  Current medications: Trulicity 1.5mg  two times per week (Mon, Thurs), Novolog uses PRN for BG >250, Toujeo 10-16 units AM and PM (usually only takes the morning dose and adjusts dosing)   *Rapid metabolizer  Fasting glucose readings: 120, 106, 143 highest Using contour traditional meter  Current medication access support: [employer] Medicare supplement   Objective:  Lab Results  Component Value Date   HGBA1C 7.8 (H) 04/10/2023    Lab Results  Component Value Date   CREATININE 0.96 04/10/2023   BUN 15 04/10/2023   NA 143 04/10/2023   K 4.3 04/10/2023   CL 102 04/10/2023   CO2 26 04/10/2023    Lab Results  Component Value Date   CHOL 210 (H) 04/10/2023   HDL 69 04/10/2023   LDLCALC 126 (H) 04/10/2023   TRIG 87 04/10/2023   CHOLHDL 3.0 04/10/2023    Medications Reviewed Today     Reviewed by Pollie Friar, RPH (Pharmacist) on 06/06/23 at 1049  Med List Status: <None>   Medication Order Taking? Sig Documenting  Provider Last Dose Status Informant  Albuterol-Budesonide (AIRSUPRA) 90-80 MCG/ACT AERO [id number] No INHALE 2 PUFFS INTO THE LUNGS 4 TIMES DAILY AS NEEDED.  Patient not taking: Reported on 06/06/2023   Blane Ohara, MD Not Taking Active   Alpha-Lipoic Acid 300 MG CAPS [id number] Yes Take 1 capsule by mouth daily. [provider] Taking Active            Med Note Lorenso Courier, Marjory Lies   Thu Jun 06, 2023 10:39 AM) For neuropathy  baclofen (LIORESAL) 10 MG tablet [id number] Yes Take 10 mg by mouth 3 (three) times daily as needed. [provider] Taking Active   Blood Glucose Calibration (ONETOUCH VERIO) SOLN [id number] Yes 1 each by In Vitro route every 30 (thirty) days. Abigail Miyamoto, MD Taking Active   Blood Glucose Monitoring Suppl [facility] VERIO) w/Device Andria Rhein [id number] Yes 1 each by Does not apply route 3 (three) times daily. Abigail Miyamoto, MD Taking Active   chlorpheniramine (CHLOR-TRIMETON) 4 MG tablet [id number] Yes Take 1 mg by mouth every 6 (six) hours as needed for allergies. [provider] Taking Active   Cholecalciferol (VITAMIN D) 125 MCG (5000 UT) CAPS [id number] Yes Take 5,000 Units by mouth in the morning and at bedtime. [provider] Taking Active            Med Note Lorenso Courier, Angelica Chessman Jun 06, 2023 10:41 AM) Taking twice a week  CONTOUR NEXT TEST test strip [id number] Yes CHECK SUGARS TWICE  A DAY. Blane Ohara, MD Taking Active   Cyanocobalamin 1000 MCG TBCR [id number] Yes Take 1 tablet by mouth every other day. [provider] Taking Active   D-Mannose POWD [id number] No Take by mouth as needed.  Patient not taking: Reported on 06/06/2023   [provider] Not Taking Active            Med Note Lorenso Courier, Angelica Chessman Jun 06, 2023 10:43 AM) Starts taking when infections recur.   doxycycline (VIBRA-TABS) 100 MG tablet [id number] No TAKE 1 TABLET BY MOUTH 2 TIMES DAILY FOR 7 DAYS.  Patient not taking: Reported on  06/06/2023   Blane Ohara, MD Not Taking Active   Dulaglutide 1.5 MG/0.5ML [facility] [id number] Yes Inject 1.5 mg into the skin 2 (two) times a week. [provider] Taking Active            Med Note Clearance Coots, Mel Almond Sep 24, 2022  9:14 AM) Taking Mondays and Thursdays  hydrOXYzine (ATARAX) 10 MG/5ML syrup [id number] No Take by mouth.  Patient not taking: Reported on 06/06/2023   [provider] Not Taking Active            Med Note Tyler Deis Mar 28, 2023 10:24 AM) Only as needed for food allergy reactions  ibuprofen (ADVIL) 200 MG tablet [id number]  Take 200 mg by mouth every 6 (six) hours as needed. [provider]  Active   Lancets MISC [id number] Yes by Does not apply route. [provider] Taking Active   NOVOLOG FLEXPEN 100 UNIT/ML FlexPen [id number] Yes Patient uses slidding scale. Maximum 30 units uses only blood sugar >250mg /dl. Using 8-10 units when sugars are high lately.  Patient taking differently: Patient uses sliding scale. Maximum 30 units uses only blood sugar >250mg /dl. Using 8-10 units when sugars are high lately.   Abigail Miyamoto, MD Taking Active   pyridOXINE (VITAMIN B-6) 50 MG tablet [id number] Yes Take 50 mg by mouth every other day. Takes the p5pb6 version [provider] Taking Active            Med Note Lorenso Courier, Marjory Lies   Thu Jun 06, 2023 10:45 AM) For fluid retention every day usually; daily if needed  TOUJEO SOLOSTAR 300 UNIT/ML Solostar Pen [id number] Yes Inject 23 Units into the skin daily.  Patient taking differently: Inject 20 Units into the skin in the morning and at bedtime. 10-16 units in the morning and 10-16 in the evening   Abigail Miyamoto, MD Taking Active   TRIAMCINOLONE ACETONIDE, TOP, 0.05 % OINT [id number] No Apply topically daily as needed. Uses as needed for rash, bug bites or on gums before dentist.  Patient not taking: Reported on 06/06/2023   [provider] Not Taking  Active   VENTOLIN HFA 108 (90 Base) MCG/ACT inhaler [id number] Yes Inhale 2 puffs into the lungs every 6 (six) hours as needed for wheezing or shortness of breath. Blane Ohara, MD Taking Active   Zinc 25 MG TABS [id number] Yes Take by mouth daily as needed. [provider] Taking Active              Assessment/Plan:   Diabetes: - Currently controlled - Recommend to continue current regimen  - Prefers not to change regimen at this time and will opt to work on improving diet/exercise   Follow Up Plan:  - Follow-up at the end of April after next A1c is checked  -  Completed Novo Nordisk PAP application today; reports the Trulicity and Toujeo were already approved; just waiting on Airsupra and Novolog approval now- aware I will only notify her if issues occur  - Notified Dr. Sedalia Muta her portion is required for application completion - Completed extensive medication review  - For future, would consider increasing the Trulicity 3mg  twice a week or opting for a consistent Toujeo dose (admits to usually forgetting afternoon dose)   Marlowe Aschoff, PharmD [facility] Phone Number: [phone]

## 2023-06-11 ENCOUNTER — Encounter: Payer: Self-pay | Admitting: Family Medicine

## 2023-06-11 NOTE — Telephone Encounter (Signed)
 Care team updated and letter sent for eye exam notes.

## 2023-06-12 NOTE — Telephone Encounter (Signed)
PAP: Patient assistance application for Trulicity has been approved by PAP Companies: [employer] from 06/26/2023 to 06/24/2024. Medication should be delivered to PAP Delivery: Home For further shipping updates, please contact [employer] at [phone]  Called to check status of [employer] application for [employer], and they were missing info.  Faxed copy of insurance card to [employer].

## 2023-06-12 NOTE — Telephone Encounter (Signed)
PAP: Patient assistance application for Suzanne Hardin has been approved by PAP Companies: [employer] from 06/26/2023 to 06/24/2024. Medication should be delivered to PAP Delivery: Home For further shipping updates, please contact [employer] ([employer]) at [phone] Pt ID is: [id number]

## 2023-06-20 ENCOUNTER — Encounter: Payer: Self-pay | Admitting: Family Medicine

## 2023-06-28 ENCOUNTER — Other Ambulatory Visit: Payer: Self-pay | Admitting: *Deleted

## 2023-06-28 NOTE — Patient Instructions (Signed)
 Visit Information  Thank you for taking time to visit with me today. Please don't hesitate to contact me if I can be of assistance to you before our next scheduled telephone appointment.  Following are the goals we discussed today:   Goals Addressed             This Visit's Progress    RNCM Care Management  Expected Outcome:  Monitor, Self-Manage and Reduce Symptoms of Diabetes       Current Barriers:  Chronic Disease Management support and education needs related to effective management of DM Lab Results  Component Value Date   HGBA1C 7.8 (H) 04/10/2023     Planned Interventions: Provided education to patient about basic DM disease process. The patient reports that from 12/14-12/20 she suffered from GI complications and poor po intake. She noted a decrease in her weight to 179 lbs. She stated during this time she did not take her Trulicity  nor has she restarted to date. She reports that she is taking her Toujeo  and her Novolog  but she notices that she has had an increase in fluid due to her frequent usage of Novolog  which she is correcting by taking her P5PB6 to help with fluid retention. She has a plan to restart her Trulicity  once weekly and monitoring closely. RNCM suggested if her readings her not satisfactory that collaboration with Pharm D may be needed sooner than her scheduled visit, patient in agreement and verbalized full understanding. She reports fasting glucose of 154 this morning. With highest reading 210. Reflective listening and support given.  Reviewed medications with patient and discussed importance of medication adherence.  Reviewed prescribed diet with patient heart healthy/ADA diet. Is compliant with dietary measures. She knows what foods she cannot eat and can eat; Counseled on importance of regular laboratory monitoring as prescribed. Has regular labwork. Discussed plans with patient for ongoing care management follow up and provided patient with direct contact  information for care management team;      Provided patient with written educational materials related to hypo and hyperglycemia and importance of correct treatment.  Reviewed scheduled/upcoming provider appointments including: PCP 10-08-2023     call provider for findings outside established parameters;       Referral made to pharmacy team for assistance with ongoing support and education from pharm D. Has an upcoming appointment on 10-22-2023 with pharmD. Reports that she has not heard any information about her Novolog  PAP, RNCM will follow up pharmacy team regarding this.    Review of patient status, including review of consultants reports, relevant laboratory and other test results, and medications completed;      Advised patient to discuss changes in her DM and DM health and well being with provider;      Screening for signs and symptoms of depression related to chronic disease state;        Assessed social determinant of health barriers;         Symptom Management: Take medications as prescribed   Attend all scheduled provider appointments Call provider office for new concerns or questions  call the Suicide and Crisis Lifeline: 988 call the [place]  National Suicide Prevention Lifeline: 671-736-4371 or TTY: 737 877 6334 TTY 310 765 3542) to talk to a trained counselor call 1-800-273-TALK (toll free, 24 hour hotline) go to [facility] [address], [place] [phone]) if experiencing a Mental Health or Behavioral Health Crisis  check feet daily for cuts, sores or redness trim toenails straight across wash and dry feet  carefully every day wear comfortable, cotton socks wear comfortable, well-fitting shoes  Follow Up Plan: Telephone follow up appointment with care management team member scheduled for: 08-13-2023 at 10:30 am       [facility] Care Management  Expected Outcome:  Monitor, Self-Manage and Reduce Symptoms of: HLD       Current Barriers:   Chronic Disease Management support and education needs related to effective management of HLD Lab Results  Component Value Date   CHOL 210 (H) 04/10/2023   HDL 69 04/10/2023   LDLCALC 126 (H) 04/10/2023   TRIG 87 04/10/2023   CHOLHDL 3.0 04/10/2023     Planned Interventions: Provider established cholesterol goals reviewed. Knows her levels and knows normals.  Denies any acute changes in her HLD or heart health. ; Counseled on importance of regular laboratory monitoring as prescribed. Has labs on a regular basis; Provided HLD educational materials; Reviewed role and benefits of statin for ASCVD risk reduction. The patient cannot take statins due to her history. The patient provided a lot of essential information relevant to the patients health history; Discussed strategies to manage statin-induced myalgias; Reviewed importance of limiting foods high in cholesterol. The patient has several allergies to medications and foods. Has to be careful about the foods she eats and medications she takes. She knows her limitations well and is very proactive with her care to keep a balance.  ; Screening for signs and symptoms of depression related to chronic disease state;  Assessed social determinant of health barriers;   Symptom Management: Take medications as prescribed   Attend all scheduled provider appointments Call provider office for new concerns or questions  call the Suicide and Crisis Lifeline: 988 call the [place]  National Suicide Prevention Lifeline: 416-087-7305 or TTY: 503-107-5050 TTY [phone]) to talk to a trained counselor call 1-800-273-TALK (toll free, 24 hour hotline) go to [facility] [address], [place] [phone]) if experiencing a Mental Health or Behavioral Health Crisis  - call for medicine refill 2 or 3 days before it runs out - take all medications exactly as prescribed - call doctor with any symptoms you believe are  related to your medicine - call doctor when you experience any new symptoms - go to all doctor appointments as scheduled  Follow Up Plan: Telephone follow up appointment with care management team member scheduled for: 08-13-2023 at 10:30 am       [facility] Care Management  Expected Outcome:  Monitor, Self-Manage, and Reduce Symptoms of Hypertension       Current Barriers:  Chronic Disease Management support and education needs related to effective management of HTN  BP Readings from Last 3 Encounters:  06/06/23 110/78  05/20/23 (!) 160/88  04/10/23 136/80   Self reported: 124/72 with pulse: 66 oxygen saturation: 98%  Planned Interventions: Evaluation of current treatment plan related to hypertension self management and patient's adherence to plan as established by provider. The patient states that her blood pressures are stable at this time, denies any acute changes. Reflective listening and support given.  Provided education to patient re: stroke prevention, s/s of heart attack and stroke; Reviewed prescribed diet Heart healthy/ADA diet. Knows her dietary restrictions and states that she is watching what she eats. She has a strict regimen and does well with managing her health and well being Reviewed medications with patient and discussed importance of compliance. The patient is compliant with medications. She has several drug allergies and food allergies. She is very knowledgeable about what  she can take and not take.  Discussed plans with patient for ongoing care management follow up and provided patient with direct contact information for care management team; Advised patient, providing education and rationale, to monitor blood pressure daily and record, calling PCP for findings outside established parameters;  Reviewed scheduled/upcoming provider appointments including: PCP 10-08-2023  Advised patient to discuss changes in her HTN or heart healthy with provider; Provided education on  prescribed diet heart healthy/ADA;  Discussed complications of poorly controlled blood pressure such as heart disease, stroke, circulatory complications, vision complications, kidney impairment, sexual dysfunction;  Screening for signs and symptoms of depression related to chronic disease state;  Assessed social determinant of health barriers;   Symptom Management: Take medications as prescribed   Attend all scheduled provider appointments Call provider office for new concerns or questions  call the Suicide and Crisis Lifeline: 988 call the [place]  National Suicide Prevention Lifeline: 985-519-5423 or TTY: (616)329-5174 TTY [phone]) to talk to a trained counselor call 1-800-273-TALK (toll free, 24 hour hotline) go to [facility] [address] [place] [phone]) if experiencing a Mental Health or Behavioral Health Crisis  check blood pressure 3 times per week write blood pressure results in a log or diary learn about high blood pressure keep a blood pressure log take blood pressure log to all doctor appointments call doctor for signs and symptoms of high blood pressure develop an action plan for high blood pressure keep all doctor appointments take medications for blood pressure exactly as prescribed report new symptoms to your doctor  Follow Up Plan: Telephone follow up appointment with care management team member scheduled for: 08-13-2023 at 10:30 am           Our next appointment is by telephone on 08-13-2023 at 10:30 am  Please call the care guide team at [phone] if you need to cancel or reschedule your appointment.   If you are experiencing a Mental Health or Behavioral Health Crisis or need someone to talk to, please call the Suicide and Crisis Lifeline: 988 call the [place]  National Suicide Prevention Lifeline: 231-836-8851 or TTY: 661-677-8540 TTY ([phone]) [phone]) to talk to a trained counselor call 1-800-273-TALK (toll  free, 24 hour hotline) call 911   Patient verbalizes understanding of instructions and care plan provided today and agrees to view in MyChart. Active MyChart status and patient understanding of how to access instructions and care plan via MyChart confirmed with patient.     Telephone follow up appointment with care management team member scheduled for:08-13-2023 at 10:30 am  Rosina Forte, BSN RN RN Care Manager  [facility]  Ambulatory Care Management  Direct Number: [phone]

## 2023-06-28 NOTE — Patient Outreach (Signed)
 Care Management   Visit Note  06/28/2023 Name: Suzanne Hardin MRN: [id number] DOB: 09/07/1955  Subjective: Suzanne Hardin is a 68 y.o. year old female who is a primary care patient of Cox, Kirsten, MD. The Care Management team was consulted for assistance.      Engaged with patient spoke with patient by telephone.    Goals Addressed             This Visit's Progress    RNCM Care Management  Expected Outcome:  Monitor, Self-Manage and Reduce Symptoms of Diabetes       Current Barriers:  Chronic Disease Management support and education needs related to effective management of DM Lab Results  Component Value Date   HGBA1C 7.8 (H) 04/10/2023     Planned Interventions: Provided education to patient about basic DM disease process. The patient reports that from 12/14-12/20 she suffered from GI complications and poor po intake. She noted a decrease in her weight to 179 lbs. She stated during this time she did not take her Trulicity  nor has she restarted to date. She reports that she is taking her Toujeo  and her Novolog  but she notices that she has had an increase in fluid due to her frequent usage of Novolog  which she is correcting by taking her P5PB6 to help with fluid retention. She has a plan to restart her Trulicity  once weekly and monitoring closely. RNCM suggested if her readings her not satisfactory that collaboration with Pharm D may be needed sooner than her scheduled visit, patient in agreement and verbalized full understanding. She reports fasting glucose of 154 this morning. With highest reading 210. Reflective listening and support given.  Reviewed medications with patient and discussed importance of medication adherence.  Reviewed prescribed diet with patient heart healthy/ADA diet. Is compliant with dietary measures. She knows what foods she cannot eat and can eat; Counseled on importance of regular laboratory monitoring as prescribed. Has regular labwork. Discussed plans with  patient for ongoing care management follow up and provided patient with direct contact information for care management team;      Provided patient with written educational materials related to hypo and hyperglycemia and importance of correct treatment.  Reviewed scheduled/upcoming provider appointments including: PCP 10-08-2023     call provider for findings outside established parameters;       Referral made to pharmacy team for assistance with ongoing support and education from pharm D. Has an upcoming appointment on 10-22-2023 with pharmD. Reports that she has not heard any information about her Novolog  PAP, RNCM will follow up pharmacy team regarding this.    Review of patient status, including review of consultants reports, relevant laboratory and other test results, and medications completed;      Advised patient to discuss changes in her DM and DM health and well being with provider;      Screening for signs and symptoms of depression related to chronic disease state;        Assessed social determinant of health barriers;         Symptom Management: Take medications as prescribed   Attend all scheduled provider appointments Call provider office for new concerns or questions  call the Suicide and Crisis Lifeline: 988 call the [place]  National Suicide Prevention Lifeline: (581) 749-3435 or TTY: (602)784-9141 TTY [phone]) to talk to a trained counselor call 1-800-273-TALK (toll free, 24 hour hotline) go to [facility] [address] [place] [phone]) if experiencing a Mental Health or Behavioral  Health Crisis  check feet daily for cuts, sores or redness trim toenails straight across wash and dry feet carefully every day wear comfortable, cotton socks wear comfortable, well-fitting shoes  Follow Up Plan: Telephone follow up appointment with care management team member scheduled for: 08-13-2023 at 10:30 am       [facility] Care Management  Expected  Outcome:  Monitor, Self-Manage and Reduce Symptoms of: HLD       Current Barriers:  Chronic Disease Management support and education needs related to effective management of HLD Lab Results  Component Value Date   CHOL 210 (H) 04/10/2023   HDL 69 04/10/2023   LDLCALC 126 (H) 04/10/2023   TRIG 87 04/10/2023   CHOLHDL 3.0 04/10/2023     Planned Interventions: Provider established cholesterol goals reviewed. Knows her levels and knows normals.  Denies any acute changes in her HLD or heart health. ; Counseled on importance of regular laboratory monitoring as prescribed. Has labs on a regular basis; Provided HLD educational materials; Reviewed role and benefits of statin for ASCVD risk reduction. The patient cannot take statins due to her history. The patient provided a lot of essential information relevant to the patients health history; Discussed strategies to manage statin-induced myalgias; Reviewed importance of limiting foods high in cholesterol. The patient has several allergies to medications and foods. Has to be careful about the foods she eats and medications she takes. She knows her limitations well and is very proactive with her care to keep a balance.  ; Screening for signs and symptoms of depression related to chronic disease state;  Assessed social determinant of health barriers;   Symptom Management: Take medications as prescribed   Attend all scheduled provider appointments Call provider office for new concerns or questions  call the Suicide and Crisis Lifeline: 988 call the [place]  National Suicide Prevention Lifeline: 276-162-5082 or TTY: 289-784-2657 TTY (726)057-8622) to talk to a trained counselor call 1-800-273-TALK (toll free, 24 hour hotline) go to [facility] [address], [place] [phone]) if experiencing a Mental Health or Behavioral Health Crisis  - call for medicine refill 2 or 3 days before it runs out - take all  medications exactly as prescribed - call doctor with any symptoms you believe are related to your medicine - call doctor when you experience any new symptoms - go to all doctor appointments as scheduled  Follow Up Plan: Telephone follow up appointment with care management team member scheduled for: 08-13-2023 at 10:30 am       [facility] Care Management  Expected Outcome:  Monitor, Self-Manage, and Reduce Symptoms of Hypertension       Current Barriers:  Chronic Disease Management support and education needs related to effective management of HTN  BP Readings from Last 3 Encounters:  06/06/23 110/78  05/20/23 (!) 160/88  04/10/23 136/80   Self reported: 124/72 with pulse: 66 oxygen saturation: 98%  Planned Interventions: Evaluation of current treatment plan related to hypertension self management and patient's adherence to plan as established by provider. The patient states that her blood pressures are stable at this time, denies any acute changes. Reflective listening and support given.  Provided education to patient re: stroke prevention, s/s of heart attack and stroke; Reviewed prescribed diet Heart healthy/ADA diet. Knows her dietary restrictions and states that she is watching what she eats. She has a strict regimen and does well with managing her health and well being Reviewed medications with patient and discussed importance of compliance. The  patient is compliant with medications. She has several drug allergies and food allergies. She is very knowledgeable about what she can take and not take.  Discussed plans with patient for ongoing care management follow up and provided patient with direct contact information for care management team; Advised patient, providing education and rationale, to monitor blood pressure daily and record, calling PCP for findings outside established parameters;  Reviewed scheduled/upcoming provider appointments including: PCP 10-08-2023  Advised patient to discuss  changes in her HTN or heart healthy with provider; Provided education on prescribed diet heart healthy/ADA;  Discussed complications of poorly controlled blood pressure such as heart disease, stroke, circulatory complications, vision complications, kidney impairment, sexual dysfunction;  Screening for signs and symptoms of depression related to chronic disease state;  Assessed social determinant of health barriers;   Symptom Management: Take medications as prescribed   Attend all scheduled provider appointments Call provider office for new concerns or questions  call the Suicide and Crisis Lifeline: 988 call the [place]  National Suicide Prevention Lifeline: (667)549-9797 or TTY: 6606258086 TTY (949) 406-0573) to talk to a trained counselor call 1-800-273-TALK (toll free, 24 hour hotline) go to [facility] [address], [place] [phone]) if experiencing a Mental Health or Behavioral Health Crisis  check blood pressure 3 times per week write blood pressure results in a log or diary learn about high blood pressure keep a blood pressure log take blood pressure log to all doctor appointments call doctor for signs and symptoms of high blood pressure develop an action plan for high blood pressure keep all doctor appointments take medications for blood pressure exactly as prescribed report new symptoms to your doctor  Follow Up Plan: Telephone follow up appointment with care management team member scheduled for: 08-13-2023 at 10:30 am           Consent to Services:  Patient was given information about care management services, agreed to services, and gave verbal consent to participate.   Plan: Telephone follow up appointment with care management team member scheduled for:08-13-2023 at 10:30 am  Rosina Forte, BSN RN RN Care Manager  [facility]  Ambulatory Care Management  Direct Number: [phone]

## 2023-06-28 NOTE — Telephone Encounter (Signed)
 PAP: Patient assistance application for Novolog  has been approved by PAP Companies: [employer] from 06/26/2023 to 06/24/2024. Medication should be delivered to PAP Delivery: Provider's office For further shipping updates, please contact [employer] at [phone] Pt ID is: WILL ADD WHEN APPROVAL LETTER RECEIVED

## 2023-07-01 ENCOUNTER — Encounter: Payer: Self-pay | Admitting: Family Medicine

## 2023-07-02 ENCOUNTER — Telehealth: Payer: Self-pay

## 2023-07-02 NOTE — Telephone Encounter (Signed)
 Patient called and stated that she tracked her Paulene Floor and it stated that it was delivered to our office on 06/28/23 at 10:37 AM.  Made patient aware we have no received any medication for here, and I will call [employer] and see what's going on.

## 2023-07-02 NOTE — Telephone Encounter (Signed)
 After speaking to [employer] patient medication was delivered to the mailbox.  Patient made aware that we have her medication and office and she come by and pick it up

## 2023-07-08 ENCOUNTER — Telehealth: Payer: Self-pay

## 2023-07-08 NOTE — Telephone Encounter (Signed)
 PAP: Patient assistance application Toujeo  for has been approved by PAP Companies: [employer] from 07/03/2023 to 06/24/2024. Medication should be delivered to PAP Delivery: Home For further shipping updates, please contact [employer] at [phone] Pt ID is: [id number]

## 2023-07-10 ENCOUNTER — Other Ambulatory Visit: Payer: Self-pay | Admitting: Family Medicine

## 2023-07-10 DIAGNOSIS — E1169 Type 2 diabetes mellitus with other specified complication: Secondary | ICD-10-CM

## 2023-07-11 ENCOUNTER — Telehealth: Payer: Self-pay

## 2023-07-11 NOTE — Telephone Encounter (Signed)
Called patient and informed her that we have received her patient assistance for her Toujeo and it is here to pick up.  Toujeo Solostar 300 units/ml 2 boxes  Lot # [id number] EXP: 03/24/2025 NDC# [phone]

## 2023-07-11 NOTE — Telephone Encounter (Signed)
Patient picked up [facility]

## 2023-07-16 ENCOUNTER — Telehealth: Payer: Self-pay

## 2023-07-16 NOTE — Telephone Encounter (Signed)
Patient assistance has been received for Trulicity 1.5 mg injection once weekly. 4 boxes received.  Patient was informed that they are here for pick up.

## 2023-07-18 NOTE — Telephone Encounter (Signed)
Medication picked up by patient.

## 2023-07-24 ENCOUNTER — Other Ambulatory Visit: Payer: Self-pay

## 2023-07-24 ENCOUNTER — Telehealth: Payer: Self-pay

## 2023-07-24 ENCOUNTER — Ambulatory Visit (INDEPENDENT_AMBULATORY_CARE_PROVIDER_SITE_OTHER): Payer: Medicare Other | Admitting: Physician Assistant

## 2023-07-24 ENCOUNTER — Encounter: Payer: Self-pay | Admitting: Physician Assistant

## 2023-07-24 ENCOUNTER — Other Ambulatory Visit: Payer: Self-pay | Admitting: Family Medicine

## 2023-07-24 VITALS — BP 134/80 | HR 80 | Temp 96.8°F | Resp 20 | Ht 66.75 in | Wt 194.2 lb

## 2023-07-24 DIAGNOSIS — N3001 Acute cystitis with hematuria: Secondary | ICD-10-CM | POA: Diagnosis not present

## 2023-07-24 DIAGNOSIS — E1121 Type 2 diabetes mellitus with diabetic nephropathy: Secondary | ICD-10-CM

## 2023-07-24 LAB — POCT URINALYSIS DIP (CLINITEK)
Bilirubin, UA: NEGATIVE
Glucose, UA: NEGATIVE mg/dL
Ketones, POC UA: NEGATIVE mg/dL
Leukocytes, UA: NEGATIVE
Nitrite, UA: NEGATIVE
Spec Grav, UA: >=1.03 — AB (ref 1.010–1.025)
Urobilinogen, UA: 0.2 U/dL
pH, UA: 5 (ref 5.0–8.0)

## 2023-07-24 MED ORDER — CONTOUR NEXT TEST VI STRP: ORAL_STRIP | 2 refills | Status: DC

## 2023-07-24 MED ORDER — DOXYCYCLINE HYCLATE 100 MG PO TABS: 100.0000 mg | ORAL_TABLET | Freq: Two times a day (BID) | ORAL | 0 refills | Status: DC

## 2023-07-24 NOTE — Telephone Encounter (Signed)
Spoke with Herbert Seta with [facility] attempted to do a PA as suggested, no form available. Called [phone] number she provided. Gentleman that I spoke with stated the PA type is "3" and the PA# is [id number]. Gave this info to Herbert Seta so that she have the rx go through.

## 2023-07-24 NOTE — Telephone Encounter (Signed)
Copied from CRM [id number]. Topic: Clinical - Prescription Issue >> Jul 24, 2023 11:48 AM Shelah Lewandowsky wrote: Reason for CRM: glucose blood (CONTOUR NEXT TEST) test strip   Heather with [facility] having an issue getting prescription covered, may need prior authorization, [phone]

## 2023-07-24 NOTE — Progress Notes (Signed)
Acute Office Visit  Subjective:    Patient ID: Suzanne Hardin, female    DOB: 1956/05/02, 68 y.o.   MRN: [id number]  Chief Complaint  Patient presents with   Urinary Tract Infection   Urinary Frequency     Patient is in today for complaints of odor of urine and had chills last night.  She has been checking ua at home and noted on Monday 3+WBC 2+RBC-  Pt with history of recurrent UTIs in past - last one in November and urine culture showed more than 1 organism - she did not repeat urine culture after treatment She denies gross hematuria Of note pt has not seen urologist for evaluation of recurrent utis in quite awhile  Past Medical History:  Diagnosis Date   Allergy history, peanuts 09/04/2012   Carotid artery occlusion 10/28/2013   Cervical dystonia 04/03/2017   Cystitis    Essential hypertension 09/16/2019   Hypertension    Moderate persistent asthma 09/16/2019   Neuropathy, peripheral 05/12/2014   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 Food, (peanuts, bell peppers, raw onions,pickles) Drug, and Skin, environmental, fragrances & air freshners   Type 2 diabetes mellitus with other specified complication (HCC) 09/16/2019    Past Surgical History:  Procedure Laterality Date   APPENDECTOMY  2000   CATARACT EXTRACTION, BILATERAL     CHOLECYSTECTOMY  2000   TONSILLECTOMY AND ADENOIDECTOMY      Family History  Problem Relation Age of Onset   Ulcers Father    Peripheral Artery Disease Father    Breast cancer Maternal Aunt    Breast cancer Maternal Aunt    Breast cancer Cousin     Social History   Socioeconomic History   Marital status: Divorced    Spouse name: Not on file   Number of children: 2   Years of education: Not on file   Highest education level: Not on file  Occupational History   Occupation: Disabled  Tobacco Use   Smoking status: Never    Smokeless tobacco: Never  Vaping Use   Vaping status: Never Used  Substance and Sexual Activity   Alcohol use: Never   Drug use: Never   Sexual activity: Yes    Partners: Male  Other Topics Concern   Not on file  Social History Narrative   Not on file   Social Drivers of Health   Financial Resource Strain: Low Risk  (02/12/2023)   Overall Financial Resource Strain (CARDIA)    Difficulty of Paying Living Expenses: Not hard at all  Food Insecurity: No Food Insecurity (02/12/2023)   Hunger Vital Sign    Worried About Running Out of Food in the Last Year: Never true    Ran Out of Food in the Last Year: Never true  Transportation Needs: No Transportation Needs (02/12/2023)   PRAPARE - Administrator, Civil Service (Medical): No    Lack of Transportation (Non-Medical): No  Physical Activity: Sufficiently Active (02/12/2023)   Exercise Vital Sign    Days of Exercise per Week: 5 days    Minutes of Exercise per Session: 150+ min  Stress: No Stress Concern Present (02/12/2023)   [employer] of Occupational Health - Occupational Stress Questionnaire    Feeling of Stress : Not at all  Social Connections: Moderately Integrated (03/25/2023)   Social Connection and Isolation Panel [NHANES]  Frequency of Communication with Friends and Family: More than three times a week    Frequency of Social Gatherings with Friends and Family: More than three times a week    Attends Religious Services: More than 4 times per year    Active Member of [employer] or Organizations: Yes    Attends Banker Meetings: 1 to 4 times per year    Marital Status: Divorced  Intimate Partner Violence: Not At Risk (02/12/2023)   Humiliation, Afraid, Rape, and Kick questionnaire    Fear of Current or Ex-Partner: No    Emotionally Abused: No    Physically Abused: No    Sexually Abused: No     Current Outpatient Medications:    Albuterol-Budesonide (AIRSUPRA) 90-80 MCG/ACT AERO, INHALE 2 PUFFS  INTO THE LUNGS 4 TIMES DAILY AS NEEDED., Disp: 10.7 g, Rfl: 2   Alpha-Lipoic Acid 300 MG CAPS, Take 1 capsule by mouth daily., Disp: , Rfl:    baclofen (LIORESAL) 10 MG tablet, Take 10 mg by mouth 3 (three) times daily as needed., Disp: , Rfl:    Blood Glucose Calibration (ONETOUCH VERIO) SOLN, 1 each by In Vitro route every 30 (thirty) days., Disp: 1 each, Rfl: 2   Blood Glucose Monitoring Suppl (ONETOUCH VERIO) w/Device KIT, 1 each by Does not apply route 3 (three) times daily., Disp: 1 kit, Rfl: 0   chlorpheniramine (CHLOR-TRIMETON) 4 MG tablet, Take 1 mg by mouth every 6 (six) hours as needed for allergies., Disp: , Rfl:    Cholecalciferol (VITAMIN D) 125 MCG (5000 UT) CAPS, Take 5,000 Units by mouth in the morning and at bedtime., Disp: , Rfl:    Cyanocobalamin 1000 MCG TBCR, Take 1 tablet by mouth every other day., Disp: , Rfl:    D-Mannose POWD, Take by mouth as needed., Disp: , Rfl:    doxycycline (VIBRA-TABS) 100 MG tablet, Take 1 tablet (100 mg total) by mouth 2 (two) times daily., Disp: 20 tablet, Rfl: 0   hydrOXYzine (ATARAX) 10 MG/5ML syrup, Take by mouth., Disp: , Rfl:    ibuprofen (ADVIL) 200 MG tablet, Take 200 mg by mouth every 6 (six) hours as needed., Disp: , Rfl:    Lancets MISC, by Does not apply route., Disp: , Rfl:    NOVOLOG FLEXPEN 100 UNIT/ML FlexPen, Patient uses slidding scale. Maximum 30 units uses only blood sugar >250mg /dl. Using 8-10 units when sugars are high lately. (Patient taking differently: Patient uses sliding scale. Maximum 30 units uses only blood sugar >250mg /dl. Using 8-10 units when sugars are high lately.), Disp: 15 mL, Rfl: 1   pyridOXINE (VITAMIN B-6) 50 MG tablet, Take 50 mg by mouth every other day. Takes the p5pb6 version, Disp: , Rfl:    TOUJEO SOLOSTAR 300 UNIT/ML Solostar Pen, Inject 23 Units into the skin daily. (Patient taking differently: Inject 20 Units into the skin in the morning and at bedtime. 10-16 units in the morning and 10-16 in the  evening), Disp: 6 pen, Rfl: 6   TRIAMCINOLONE ACETONIDE, TOP, 0.05 % OINT, Apply topically daily as needed. Uses as needed for rash, bug bites or on gums before dentist., Disp: , Rfl:    TRULICITY 1.5 MG/0.5ML SOAJ, INJECT 1.5 MG (0.5ML) UNDER THE SKIN TWICE WEEKLY. MAX WEEKLY DOSE 3MG ., Disp: 16 mL, Rfl: 0   VENTOLIN HFA 108 (90 Base) MCG/ACT inhaler, Inhale 2 puffs into the lungs every 6 (six) hours as needed for wheezing or shortness of breath., Disp: 1 each, Rfl: 3  Zinc 25 MG TABS, Take by mouth daily as needed., Disp: , Rfl:    glucose blood (CONTOUR NEXT TEST) test strip, CHECK SUGARS TWICE A DAY., Disp: 300 strip, Rfl: 2   Allergies  Allergen Reactions   Dextromethorphan-Guaifenesin Itching   Dorzolamide Hcl-Timolol Mal Nausea And Vomiting    Brachycardia, severe headache, swelling Other reaction(s): Other (See Comments) Brachycardia, severe headache, swelling Brachycardia, severe headache, swelling   Hydrocodone Other (See Comments)    Inflammatory Inflammatory Other reaction(s): Other (See Comments) Inflammatory Inflammatory    Lidocaine Swelling   Other Rash, Other (See Comments) and Swelling    Neurological damage fragances fragances  Neurological damage Fragances; YELLOW NUMBER 5;  Yellow #5 Yellow #5 Yellow #5 Yellow #5  High eye pressure High eye pressure High eye pressure    Sodium Laureth Sulfate Dermatitis, Hives, Itching, Other (See Comments), Rash and Swelling   Sulfa Antibiotics Hives   Sulfites Shortness Of Breath    Other reaction(s): Shortness of Breath Other reaction(s): Shortness of Breath    Statins Hives and Other (See Comments)   Articaine-Epinephrine Swelling   Cymbalta [Duloxetine Hcl]    Darvon [Propoxyphene]    Duloxetine    Gentamicin Sulfate    Hctz [Hydrochlorothiazide]    Humalog [Insulin Lispro]    Keflex [Cephalexin]    Lasix [Furosemide]    Lisinopril-Hydrochlorothiazide    Loratadine    Losartan    Macrodantin  [Nitrofurantoin]    Metformin    Mucinex [Guaifenesin Er]    Peanut-Containing Drug Products    Penicillins    Pioglitazone    Tessalon Perles [Benzonatate]    Travatan [Travoprost]    Tylenol [Acetaminophen]    Propofol Other (See Comments) and Nausea And Vomiting   Sulfamethoxazole-Trimethoprim Rash    CONSTITUTIONAL: see HPI GU - see HPI         Objective:  PHYSICAL EXAM:   VS: BP 134/80 (BP Location: Left Arm, Patient Position: Sitting, Cuff Size: Large)   Pulse 80   Temp (!) 96.8 F (36 C) (Temporal)   Resp 20   Ht 5' 6.75" (1.695 m)   Wt 194 lb 3.2 oz (88.1 kg)   SpO2 97%   BMI 30.64 kg/m   GEN: Well nourished, well developed, in no acute distress  Cardiac: RRR; no murmurs, Respiratory:  normal respiratory rate and pattern with no distress - normal breath sounds with no rales, rhonchi, wheezes or rubs  Psych: euthymic mood, appropriate affect and demeanor Office Visit on 07/24/2023  Component Date Value Ref Range Status   Color, UA 07/24/2023 yellow  yellow Final   Clarity, UA 07/24/2023 cloudy (A)  clear Final   Glucose, UA 07/24/2023 negative  negative mg/dL Final   Bilirubin, UA 96/09/5407 negative  negative Final   Ketones, POC UA 07/24/2023 negative  negative mg/dL Final   Spec Grav, UA 81/19/1478 >=1.030 (A)  1.010 - 1.025 Final   Blood, UA 07/24/2023 large (A)  negative Final   pH, UA 07/24/2023 5.0  5.0 - 8.0 Final   POC PROTEIN,UA 07/24/2023 trace  negative, trace Final   Urobilinogen, UA 07/24/2023 0.2  0.2 or 1.0 E.U./dL Final   Nitrite, UA 29/56/2130 Negative  Negative Final   Leukocytes, UA 07/24/2023 Negative  Negative Final     Office Visit on 07/24/2023  Component Date Value Ref Range Status   Color, UA 07/24/2023 yellow  yellow Final   Clarity, UA 07/24/2023 cloudy (A)  clear Final   Glucose, UA 07/24/2023 negative  negative mg/dL Final   Bilirubin, UA 16/03/9603 negative  negative Final   Ketones, POC UA 07/24/2023 negative   negative mg/dL Final   Spec Grav, UA 54/02/8118 >=1.030 (A)  1.010 - 1.025 Final   Blood, UA 07/24/2023 large (A)  negative Final   pH, UA 07/24/2023 5.0  5.0 - 8.0 Final   POC PROTEIN,UA 07/24/2023 trace  negative, trace Final   Urobilinogen, UA 07/24/2023 0.2  0.2 or 1.0 E.U./dL Final   Nitrite, UA 14/78/2956 Negative  Negative Final   Leukocytes, UA 07/24/2023 Negative  Negative Final     Wt Readings from Last 3 Encounters:  07/24/23 194 lb 3.2 oz (88.1 kg)  05/20/23 192 lb 12.8 oz (87.5 kg)  04/10/23 188 lb (85.3 kg)    Health Maintenance Due  Topic Date Due   Hepatitis C Screening  Never done    There are no preventive care reminders to display for this patient.        Assessment & Plan:   Problem List Items Addressed This Visit       Genitourinary   Acute cystitis with hematuria - Primary   Relevant Medications   doxycycline (VIBRA-TABS) 100 MG tablet   Other Relevant Orders   POCT URINALYSIS DIP (CLINITEK) (Completed)   Urine Culture    Recommend pt to repeat urine culture  after treatment Also recommend urology consult for recurrent utis Meds ordered this encounter  Medications   doxycycline (VIBRA-TABS) 100 MG tablet    Sig: Take 1 tablet (100 mg total) by mouth 2 (two) times daily.    Dispense:  20 tablet    Refill:  0    PLEASE USE THE SAME GENERIC MEDICATION THAT WAS GIVEN TO PATIENT IN 11/24 THAT DOES NOT CONTAIN YELLOW 5    Supervising Provider:   Blane Ohara [[id number]]     SARA R Everlyn Farabaugh, PA-C

## 2023-07-27 LAB — URINE CULTURE

## 2023-07-29 ENCOUNTER — Encounter: Payer: Self-pay | Admitting: Physician Assistant

## 2023-08-09 ENCOUNTER — Ambulatory Visit: Payer: Medicare Other

## 2023-08-09 DIAGNOSIS — N3001 Acute cystitis with hematuria: Secondary | ICD-10-CM | POA: Diagnosis not present

## 2023-08-09 LAB — POCT URINALYSIS DIP (CLINITEK)
Bilirubin, UA: NEGATIVE
Glucose, UA: 250 mg/dL — AB
Ketones, POC UA: NEGATIVE mg/dL
Nitrite, UA: POSITIVE — AB
Spec Grav, UA: 1.025
Urobilinogen, UA: 0.2 U/dL
pH, UA: 6

## 2023-08-09 NOTE — Progress Notes (Signed)
Patient presented as a nurse visit for repeat UA and culture. Per Huston Foley will send for culture, depending on results will determine if patient needs another round of axb x or a different ax bx. Patient verbalized understanding.

## 2023-08-12 LAB — URINE CULTURE

## 2023-08-13 ENCOUNTER — Telehealth: Payer: Self-pay

## 2023-08-13 ENCOUNTER — Other Ambulatory Visit: Payer: Self-pay | Admitting: Family Medicine

## 2023-08-13 ENCOUNTER — Other Ambulatory Visit: Payer: Self-pay | Admitting: *Deleted

## 2023-08-13 ENCOUNTER — Encounter: Payer: Self-pay | Admitting: Family Medicine

## 2023-08-13 MED ORDER — CIPROFLOXACIN HCL 250 MG PO TABS: 250.0000 mg | ORAL_TABLET | Freq: Two times a day (BID) | ORAL | 0 refills | Status: AC

## 2023-08-13 NOTE — Telephone Encounter (Signed)
Patient called to inform here we got her Trulicity in and it is ready for pick up.  Trulicity (8 boxes)

## 2023-08-13 NOTE — Patient Instructions (Signed)
Visit Information  Thank you for taking time to visit with me today. Please don't hesitate to contact me if I can be of assistance to you before our next scheduled telephone appointment.  Following are the goals we discussed today:   Goals Addressed             This Visit's Progress    RNCM Care Management  Expected Outcome:  Monitor, Self-Manage and Reduce Symptoms of Diabetes       Current Barriers:  Chronic Disease Management support and education needs related to effective management of DM Lab Results  Component Value Date   HGBA1C 7.8 (H) 04/10/2023     Planned Interventions: Provided education to patient about basic DM disease process. She reports fasting glucose of 150 this morning. With highest reading 230. She attributes her elevated readings due to taking baclofen. Reflective listening and support given.  Reviewed medications with patient and discussed importance of medication adherence. Self manages and reports compliance Reviewed prescribed diet with patient heart healthy/ADA diet. Is compliant with dietary measures. She knows what foods she cannot eat and can eat; Counseled on importance of regular laboratory monitoring as prescribed. Has regular labwork. Discussed plans with patient for ongoing care management follow up and provided patient with direct contact information for care management team;      Provided patient with written educational materials related to hypo and hyperglycemia and importance of correct treatment.  Reviewed scheduled/upcoming provider appointments including: PCP 10-08-23, PharmD 10-22-23     call provider for findings outside established parameters;       Referral made to pharmacy team for assistance with ongoing support and education from pharm D.    Review of patient status, including review of consultants reports, relevant laboratory and other test results, and medications completed;      Advised patient to discuss changes in her DM and DM health  and well being with provider;      Screening for signs and symptoms of depression related to chronic disease state;        Assessed social determinant of health barriers;         Symptom Management: Take medications as prescribed   Attend all scheduled provider appointments Call provider office for new concerns or questions  call the Suicide and Crisis Lifeline: 988 call the [place] National Suicide Prevention Lifeline: 587-084-7610 or TTY: 586-808-0399 TTY [phone]) to talk to a trained counselor call 1-800-273-TALK (toll free, 24 hour hotline) go to [facility] [address], [place] [phone]) if experiencing a Mental Health or Behavioral Health Crisis  check feet daily for cuts, sores or redness trim toenails straight across wash and dry feet carefully every day wear comfortable, cotton socks wear comfortable, well-fitting shoes  Follow Up Plan: Telephone follow up appointment with care management team member scheduled for: 10-11-2023 at 9:45 am       RNCM Care Management  Expected Outcome:  Monitor, Self-Manage and Reduce Symptoms of: HLD       Current Barriers:  Chronic Disease Management support and education needs related to effective management of HLD Lab Results  Component Value Date   CHOL 210 (H) 04/10/2023   HDL 69 04/10/2023   LDLCALC 126 (H) 04/10/2023   TRIG 87 04/10/2023   CHOLHDL 3.0 04/10/2023     Planned Interventions: Provider established cholesterol goals reviewed. Knows her levels and knows normals.  Denies any acute changes in her HLD or heart health. ; Counseled on importance of  regular laboratory monitoring as prescribed. Has labs on a regular basis; Provided HLD educational materials; Reviewed role and benefits of statin for ASCVD risk reduction. The patient cannot take statins due to her history. The patient provided a lot of essential information relevant to the patients health history; Discussed  strategies to manage statin-induced myalgias; Reviewed importance of limiting foods high in cholesterol. The patient has several allergies to medications and foods. Has to be careful about the foods she eats and medications she takes. She knows her limitations well and is very proactive with her care to keep a balance.  ; Screening for signs and symptoms of depression related to chronic disease state;  Assessed social determinant of health barriers;   Symptom Management: Take medications as prescribed   Attend all scheduled provider appointments Call provider office for new concerns or questions  call the Suicide and Crisis Lifeline: 988 call the [place] National Suicide Prevention Lifeline: 941 271 8374 or TTY: 231-873-0004 TTY [phone]) to talk to a trained counselor call 1-800-273-TALK (toll free, 24 hour hotline) go to [facility] [address] [place] [phone]) if experiencing a Mental Health or Behavioral Health Crisis  - call for medicine refill 2 or 3 days before it runs out - take all medications exactly as prescribed - call doctor with any symptoms you believe are related to your medicine - call doctor when you experience any new symptoms - go to all doctor appointments as scheduled  Follow Up Plan: Telephone follow up appointment with care management team member scheduled for: 10-11-2023 at 9:45 am       [facility] Care Management  Expected Outcome:  Monitor, Self-Manage, and Reduce Symptoms of Hypertension       Current Barriers:  Chronic Disease Management support and education needs related to effective management of HTN  BP Readings from Last 3 Encounters:  07/24/23 134/80  06/06/23 110/78  05/20/23 (!) 160/88    Planned Interventions: Evaluation of current treatment plan related to hypertension self management and patient's adherence to plan as established by provider. Denies any acute changes. Reports stable BP. Has not been  able to check due to neurologic flair ups. Reflective listening and support given.  Provided education to patient re: stroke prevention, s/s of heart attack and stroke. Support and education. Reviewed prescribed diet Heart healthy/ADA diet. Knows her dietary restrictions and states that she is watching what she eats. She has a strict regimen and does well with managing her health and well being Reviewed medications with patient and discussed importance of compliance. The patient is compliant with medications. She has several drug allergies and food allergies. She is very knowledgeable about what she can take and not take.  Discussed plans with patient for ongoing care management follow up and provided patient with direct contact information for care management team; Advised patient, providing education and rationale, to monitor blood pressure daily and record, calling PCP for findings outside established parameters;  Reviewed scheduled/upcoming provider appointments including: PCP 10-08-2023, PharmD 10-22-23  Advised patient to discuss changes in her HTN or heart healthy with provider; Provided education on prescribed diet heart healthy/ADA;  Discussed complications of poorly controlled blood pressure such as heart disease, stroke, circulatory complications, vision complications, kidney impairment, sexual dysfunction;  Screening for signs and symptoms of depression related to chronic disease state;  Assessed social determinant of health barriers;   Symptom Management: Take medications as prescribed   Attend all scheduled provider appointments Call provider office for new concerns or  questions  call the Suicide and Crisis Lifeline: 988 call the [place] National Suicide Prevention Lifeline: 318 021 5586 or TTY: 609-107-8592 TTY [phone]) to talk to a trained counselor call 1-800-273-TALK (toll free, 24 hour hotline) go to [facility] [address], [place]  [phone]) if experiencing a Mental Health or Behavioral Health Crisis  check blood pressure 3 times per week write blood pressure results in a log or diary learn about high blood pressure keep a blood pressure log take blood pressure log to all doctor appointments call doctor for signs and symptoms of high blood pressure develop an action plan for high blood pressure keep all doctor appointments take medications for blood pressure exactly as prescribed report new symptoms to your doctor  Follow Up Plan: Telephone follow up appointment with care management team member scheduled for: 10-11-2023 at 9:45 am           Our next appointment is by telephone on 10-11-2023 9:45 am  Please call the care guide team at [phone] if you need to cancel or reschedule your appointment.   If you are experiencing a Mental Health or Behavioral Health Crisis or need someone to talk to, please call the Suicide and Crisis Lifeline: 988 call the [place] National Suicide Prevention Lifeline: (513)253-1770 or TTY: 505 187 0315 TTY [phone]) to talk to a trained counselor call 1-800-273-TALK (toll free, 24 hour hotline)   Patient verbalizes understanding of instructions and care plan provided today and agrees to view in MyChart. Active MyChart status and patient understanding of how to access instructions and care plan via MyChart confirmed with patient.     Telephone follow up appointment with care management team member scheduled for:10-11-2023 at 9:45 am  Larey Brick, BSN RN [facility], [facility] RN Care Manager Direct Dial: [phone]  Fax: [phone]

## 2023-08-13 NOTE — Patient Outreach (Signed)
Care Management   Visit Note  08/13/2023 Name: [name] MRN: [id number] DOB: 1955-08-12  Subjective: Suzanne Hardin is a 68 y.o. year old female who is a primary care patient of Cox, Kirsten, MD. The Care Management team was consulted for assistance.      Engaged with patient spoke with patient by telephone.    Goals Addressed             This Visit's Progress    RNCM Care Management  Expected Outcome:  Monitor, Self-Manage and Reduce Symptoms of Diabetes       Current Barriers:  Chronic Disease Management support and education needs related to effective management of DM Lab Results  Component Value Date   HGBA1C 7.8 (H) 04/10/2023     Planned Interventions: Provided education to patient about basic DM disease process. She reports fasting glucose of 150 this morning. With highest reading 230. She attributes her elevated readings due to taking baclofen. Reflective listening and support given.  Reviewed medications with patient and discussed importance of medication adherence. Self manages and reports compliance Reviewed prescribed diet with patient heart healthy/ADA diet. Is compliant with dietary measures. She knows what foods she cannot eat and can eat; Counseled on importance of regular laboratory monitoring as prescribed. Has regular labwork. Discussed plans with patient for ongoing care management follow up and provided patient with direct contact information for care management team;      Provided patient with written educational materials related to hypo and hyperglycemia and importance of correct treatment.  Reviewed scheduled/upcoming provider appointments including: PCP 10-08-23, PharmD 10-22-23     call provider for findings outside established parameters;       Referral made to pharmacy team for assistance with ongoing support and education from pharm D.    Review of patient status, including review of consultants reports, relevant laboratory and other test  results, and medications completed;      Advised patient to discuss changes in her DM and DM health and well being with provider;      Screening for signs and symptoms of depression related to chronic disease state;        Assessed social determinant of health barriers;         Symptom Management: Take medications as prescribed   Attend all scheduled provider appointments Call provider office for new concerns or questions  call the Suicide and Crisis Lifeline: 988 call the [place] National Suicide Prevention Lifeline: (281)127-5629 or TTY: (270)607-1364 TTY (717)501-0962) to talk to a trained counselor call 1-800-273-TALK (toll free, 24 hour hotline) go to [facility] [address] [place] [phone]) if experiencing a Mental Health or Behavioral Health Crisis  check feet daily for cuts, sores or redness trim toenails straight across wash and dry feet carefully every day wear comfortable, cotton socks wear comfortable, well-fitting shoes  Follow Up Plan: Telephone follow up appointment with care management team member scheduled for: 10-11-2023 at 9:45 am       RNCM Care Management  Expected Outcome:  Monitor, Self-Manage and Reduce Symptoms of: HLD       Current Barriers:  Chronic Disease Management support and education needs related to effective management of HLD Lab Results  Component Value Date   CHOL 210 (H) 04/10/2023   HDL 69 04/10/2023   LDLCALC 126 (H) 04/10/2023   TRIG 87 04/10/2023   CHOLHDL 3.0 04/10/2023     Planned Interventions: Provider established cholesterol goals reviewed. Knows her levels  and knows normals.  Denies any acute changes in her HLD or heart health. ; Counseled on importance of regular laboratory monitoring as prescribed. Has labs on a regular basis; Provided HLD educational materials; Reviewed role and benefits of statin for ASCVD risk reduction. The patient cannot take statins due to her history. The  patient provided a lot of essential information relevant to the patients health history; Discussed strategies to manage statin-induced myalgias; Reviewed importance of limiting foods high in cholesterol. The patient has several allergies to medications and foods. Has to be careful about the foods she eats and medications she takes. She knows her limitations well and is very proactive with her care to keep a balance.  ; Screening for signs and symptoms of depression related to chronic disease state;  Assessed social determinant of health barriers;   Symptom Management: Take medications as prescribed   Attend all scheduled provider appointments Call provider office for new concerns or questions  call the Suicide and Crisis Lifeline: 988 call the [place] National Suicide Prevention Lifeline: 2238276143 or TTY: 925 430 9016 TTY [phone]) to talk to a trained counselor call 1-800-273-TALK (toll free, 24 hour hotline) go to [facility] [address] [place] [phone]) if experiencing a Mental Health or Behavioral Health Crisis  - call for medicine refill 2 or 3 days before it runs out - take all medications exactly as prescribed - call doctor with any symptoms you believe are related to your medicine - call doctor when you experience any new symptoms - go to all doctor appointments as scheduled  Follow Up Plan: Telephone follow up appointment with care management team member scheduled for: 10-11-2023 at 9:45 am       [facility] Care Management  Expected Outcome:  Monitor, Self-Manage, and Reduce Symptoms of Hypertension       Current Barriers:  Chronic Disease Management support and education needs related to effective management of HTN  BP Readings from Last 3 Encounters:  07/24/23 134/80  06/06/23 110/78  05/20/23 (!) 160/88    Planned Interventions: Evaluation of current treatment plan related to hypertension self management and patient's  adherence to plan as established by provider. Denies any acute changes. Reports stable BP. Has not been able to check due to neurologic flair ups. Reflective listening and support given.  Provided education to patient re: stroke prevention, s/s of heart attack and stroke. Support and education. Reviewed prescribed diet Heart healthy/ADA diet. Knows her dietary restrictions and states that she is watching what she eats. She has a strict regimen and does well with managing her health and well being Reviewed medications with patient and discussed importance of compliance. The patient is compliant with medications. She has several drug allergies and food allergies. She is very knowledgeable about what she can take and not take.  Discussed plans with patient for ongoing care management follow up and provided patient with direct contact information for care management team; Advised patient, providing education and rationale, to monitor blood pressure daily and record, calling PCP for findings outside established parameters;  Reviewed scheduled/upcoming provider appointments including: PCP 10-08-2023, PharmD 10-22-23  Advised patient to discuss changes in her HTN or heart healthy with provider; Provided education on prescribed diet heart healthy/ADA;  Discussed complications of poorly controlled blood pressure such as heart disease, stroke, circulatory complications, vision complications, kidney impairment, sexual dysfunction;  Screening for signs and symptoms of depression related to chronic disease state;  Assessed social determinant of health barriers;   Symptom  Management: Take medications as prescribed   Attend all scheduled provider appointments Call provider office for new concerns or questions  call the Suicide and Crisis Lifeline: 988 call the [place] National Suicide Prevention Lifeline: 731 803 0800 or TTY: 509-701-1986 TTY 2485380471) to talk to a trained counselor call 1-800-273-TALK (toll  free, 24 hour hotline) go to [facility] [address], [place] [phone]) if experiencing a Mental Health or Behavioral Health Crisis  check blood pressure 3 times per week write blood pressure results in a log or diary learn about high blood pressure keep a blood pressure log take blood pressure log to all doctor appointments call doctor for signs and symptoms of high blood pressure develop an action plan for high blood pressure keep all doctor appointments take medications for blood pressure exactly as prescribed report new symptoms to your doctor  Follow Up Plan: Telephone follow up appointment with care management team member scheduled for: 10-11-2023 at 9:45 am            Consent to Services:  Patient was given information about care management services, agreed to services, and gave verbal consent to participate.   Plan: Telephone follow up appointment with care management team member scheduled for:10-11-2023 at 9:45 am  Larey Brick, BSN RN [facility], [facility] RN Care Manager Direct Dial: [phone]  Fax: [phone]

## 2023-08-19 NOTE — Telephone Encounter (Signed)
 Patient picked up Trulicity on 08/19/23

## 2023-08-20 ENCOUNTER — Telehealth: Payer: Self-pay

## 2023-08-20 NOTE — Telephone Encounter (Signed)
 Left detailed voicemail on patient's phone informing her of Received Patient Assistance for Airsupra, and told her it is here for pick up.    Received Airsupra- 3 inhailers NDC: [id number] Lot: [id number]

## 2023-08-21 NOTE — Telephone Encounter (Signed)
 Patient picked up Airsupar

## 2023-08-27 ENCOUNTER — Telehealth: Payer: Self-pay

## 2023-08-27 NOTE — Telephone Encounter (Signed)
 PATIENT PICKED UP PATIENT ASSISTANCE.

## 2023-08-27 NOTE — Telephone Encounter (Signed)
 Called and left voicemail informing patient of patient assistance received for Novolog and Novofine pen needles.  Novolog 3 boxes:  NDC: [phone] Lot# [id number] EXP: 09/22/25   NOVOFINE PEN NEEDLES:  2 BOXES NDC: [phone] LOT: [id number] EXP: 12/23/27

## 2023-09-17 ENCOUNTER — Encounter: Payer: Self-pay | Admitting: Physician Assistant

## 2023-09-17 ENCOUNTER — Ambulatory Visit (INDEPENDENT_AMBULATORY_CARE_PROVIDER_SITE_OTHER): Admitting: Physician Assistant

## 2023-09-17 VITALS — BP 132/82 | HR 88 | Temp 97.3°F | Ht 66.75 in | Wt 195.0 lb

## 2023-09-17 DIAGNOSIS — R8290 Unspecified abnormal findings in urine: Secondary | ICD-10-CM | POA: Insufficient documentation

## 2023-09-17 DIAGNOSIS — E119 Type 2 diabetes mellitus without complications: Secondary | ICD-10-CM

## 2023-09-17 DIAGNOSIS — Z794 Long term (current) use of insulin: Secondary | ICD-10-CM

## 2023-09-17 DIAGNOSIS — N3001 Acute cystitis with hematuria: Secondary | ICD-10-CM | POA: Diagnosis not present

## 2023-09-17 LAB — POCT URINALYSIS DIP (CLINITEK)
Bilirubin, UA: NEGATIVE
Glucose, UA: NEGATIVE mg/dL
Ketones, POC UA: NEGATIVE mg/dL
Nitrite, UA: POSITIVE — AB
Spec Grav, UA: 1.02 (ref 1.010–1.025)
Urobilinogen, UA: NEGATIVE U/dL — AB
pH, UA: 6 (ref 5.0–8.0)

## 2023-09-17 NOTE — Assessment & Plan Note (Signed)
>>  ASSESSMENT AND PLAN FOR DIABETES MELLITUS ([facility]) WRITTEN ON 09/17/2023  6:54 PM BY CRAFT, BRADY, PA  Diabetes managed with Toujeo and Trulicity. Baclofen increases blood sugar, requiring Novolog for spikes. Trulicity adjusted to every six days for better control. - Continue Toujeo and Trulicity with current dosing schedule. - Use Novolog as needed for blood sugar spikes.

## 2023-09-17 NOTE — Progress Notes (Signed)
 Acute Office Visit  Subjective:    Patient ID: Suzanne Hardin, female    DOB: 1956-02-10, 68 y.o.   MRN: [id number]  Chief Complaint  Patient presents with   Urinary Tract Infection   HPI: Patient is in today for UTI symptoms  Discussed the use of AI scribe software for clinical note transcription with the patient, who gave verbal consent to proceed.  History of Present Illness   The patient, with a complex medical history including cranial dystonia, diabetes, and multiple allergies, presents with symptoms suggestive of a urinary tract infection (UTI). She reports cloudy, foul-smelling urine that started over the weekend. The patient has been drinking lots of water to alleviate symptoms but noticed the smell persisting into the morning. She also reports feeling unusually sleepy, which she associates with the onset of UTIs. The patient has a history of frequent UTIs, which she believes may be related to spasms of the bladder stem caused by her dystonia. She manages her dystonia with Botox treatments every 92 days and occasional baclofen, which she avoids due to its impact on her blood sugar levels. The patient also has diabetes, which she manages with Trulicity, Toujeo, and occasional Novolog. She reports having to adjust her medication regimen frequently due to rapid drug elimination and side effects. The patient also mentions a history of allergies to multiple medications and substances, including sulfites, sodium lauryl sulfate, lanolines, linalool, limonene, corn starch, ethanol, beta blockers, penicillins, cephaloprins, and sulfa drugs. She reports severe reactions to these substances, including blisters and a drop in heart rate.       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 Food, (peanuts, bell peppers, raw onions,pickles) Drug, and Skin, environmental, fragrances & air freshners   Type 2 diabetes mellitus with other specified complication (HCC) 09/16/2019    Past Surgical History:  Procedure Laterality Date   APPENDECTOMY  2000   CATARACT EXTRACTION, BILATERAL     CHOLECYSTECTOMY  2000   TONSILLECTOMY AND ADENOIDECTOMY      Family History  Problem Relation Age of Onset   Ulcers Father    Peripheral Artery Disease Father    Breast cancer Maternal Aunt    Breast cancer Maternal Aunt    Breast cancer Cousin     Social History   Socioeconomic History   Marital status: Divorced    Spouse name: Not on file   Number of children: 2   Years of education: Not on file   Highest education level: Not on file  Occupational History   Occupation: Disabled  Tobacco Use   Smoking status: Never   Smokeless tobacco: Never  Vaping Use   Vaping status: Never Used  Substance and Sexual Activity   Alcohol use: Never   Drug use: Never   Sexual activity: Yes    Partners: Male  Other Topics Concern   Not on file  Social History Narrative   Not on file   Social Drivers of Health   Financial Resource Strain: Low Risk  (02/12/2023)   Overall Financial Resource Strain (CARDIA)    Difficulty of Paying Living  Expenses: Not hard at all  Food Insecurity: No Food Insecurity (02/12/2023)   Hunger Vital Sign    Worried About Running Out of Food in the Last Year: Never true    Ran Out of Food in the Last Year: Never true  Transportation Needs: No Transportation Needs (02/12/2023)   PRAPARE - Administrator, Civil Service (Medical): No    Lack of Transportation (Non-Medical): No  Physical Activity: Sufficiently Active (02/12/2023)   Exercise Vital Sign    Days of Exercise  per Week: 5 days    Minutes of Exercise per Session: 150+ min  Stress: No Stress Concern Present (02/12/2023)   [employer] of Occupational Health - Occupational Stress Questionnaire    Feeling of Stress : Not at all  Social Connections: Moderately Integrated (03/25/2023)   Social Connection and Isolation Panel [NHANES]    Frequency of Communication with Friends and Family: More than three times a week    Frequency of Social Gatherings with Friends and Family: More than three times a week    Attends Religious Services: More than 4 times per year    Active Member of [employer] or Organizations: Yes    Attends Banker Meetings: 1 to 4 times per year    Marital Status: Divorced  Catering manager Violence: Not At Risk (02/12/2023)   Humiliation, Afraid, Rape, and Kick questionnaire    Fear of Current or Ex-Partner: No    Emotionally Abused: No    Physically Abused: No    Sexually Abused: No    Outpatient Medications Prior to Visit  Medication Sig Dispense Refill   Albuterol-Budesonide (AIRSUPRA) 90-80 MCG/ACT AERO INHALE 2 PUFFS INTO THE LUNGS 4 TIMES DAILY AS NEEDED. 10.7 g 2   Alpha-Lipoic Acid 300 MG CAPS Take 1 capsule by mouth daily.     baclofen (LIORESAL) 10 MG tablet Take 10 mg by mouth 3 (three) times daily as needed.     Blood Glucose Calibration (ONETOUCH VERIO) SOLN 1 each by In Vitro route every 30 (thirty) days. 1 each 2   Blood Glucose Monitoring Suppl (ONETOUCH VERIO) w/Device KIT 1 each by Does not apply route 3 (three) times daily. 1 kit 0   chlorpheniramine (CHLOR-TRIMETON) 4 MG tablet Take 1 mg by mouth every 6 (six) hours as needed for allergies.     Cholecalciferol (VITAMIN D) 125 MCG (5000 UT) CAPS Take 5,000 Units by mouth in the morning and at bedtime.     CONTOUR NEXT TEST test strip USE AS DIRECTED 300 strip 2   Cyanocobalamin 1000 MCG TBCR Take 1 tablet by mouth every other day.     D-Mannose POWD Take by mouth as needed.     doxycycline  (VIBRA-TABS) 100 MG tablet Take 1 tablet (100 mg total) by mouth 2 (two) times daily. 20 tablet 0   hydrOXYzine (ATARAX) 10 MG/5ML syrup Take by mouth.     ibuprofen (ADVIL) 200 MG tablet Take 200 mg by mouth every 6 (six) hours as needed.     Lancets MISC by Does not apply route.     NOVOLOG FLEXPEN 100 UNIT/ML FlexPen Patient uses slidding scale. Maximum 30 units uses only blood sugar >250mg /dl. Using 8-10 units when sugars are high lately. (Patient taking differently: Patient uses sliding scale. Maximum 30 units uses only blood sugar >250mg /dl. Using 8-10 units when sugars are high lately.) 15 mL 1   pyridOXINE (VITAMIN B-6) 50 MG tablet Take 50 mg by mouth every other day. Takes  the [id number] version     TOUJEO SOLOSTAR 300 UNIT/ML Solostar Pen Inject 23 Units into the skin daily. (Patient taking differently: Inject 20 Units into the skin in the morning and at bedtime. 10-16 units in the morning and 10-16 in the evening) 6 pen 6   TRIAMCINOLONE ACETONIDE, TOP, 0.05 % OINT Apply topically daily as needed. Uses as needed for rash, bug bites or on gums before dentist.     TRULICITY 1.5 MG/0.5ML SOAJ INJECT 1.5 MG (0.5ML) UNDER THE SKIN TWICE WEEKLY. MAX WEEKLY DOSE 3MG . 16 mL 0   VENTOLIN HFA 108 (90 Base) MCG/ACT inhaler Inhale 2 puffs into the lungs every 6 (six) hours as needed for wheezing or shortness of breath. 1 each 3   Zinc 25 MG TABS Take by mouth daily as needed.     No facility-administered medications prior to visit.    Allergies  Allergen Reactions   Dextromethorphan-Guaifenesin Itching   Dorzolamide Hcl-Timolol Mal Nausea And Vomiting    Brachycardia, severe headache, swelling Other reaction(s): Other (See Comments) Brachycardia, severe headache, swelling Brachycardia, severe headache, swelling   Hydrocodone Other (See Comments)    Inflammatory Inflammatory Other reaction(s): Other (See Comments) Inflammatory Inflammatory    Lidocaine Swelling   Other Rash, Other (See  Comments) and Swelling    Neurological damage fragances fragances  Neurological damage Fragances; YELLOW NUMBER 5;  Yellow #5 Yellow #5 Yellow #5 Yellow #5  High eye pressure High eye pressure High eye pressure    Sodium Laureth Sulfate Dermatitis, Hives, Itching, Other (See Comments), Rash and Swelling   Sulfa Antibiotics Hives   Sulfites Shortness Of Breath    Other reaction(s): Shortness of Breath Other reaction(s): Shortness of Breath    Statins Hives and Other (See Comments)   Articaine-Epinephrine Swelling   Cymbalta [Duloxetine Hcl]    Darvon [Propoxyphene]    Duloxetine    Gentamicin Sulfate    Hctz [Hydrochlorothiazide]    Humalog [Insulin Lispro]    Keflex [Cephalexin]    Lasix [Furosemide]    Lisinopril-Hydrochlorothiazide    Loratadine    Losartan    Macrodantin [Nitrofurantoin]    Metformin    Mucinex [Guaifenesin Er]    Peanut-Containing Drug Products    Penicillins    Pioglitazone    Tessalon Perles [Benzonatate]    Travatan [Travoprost]    Tylenol [Acetaminophen]    Propofol Other (See Comments) and Nausea And Vomiting   Sulfamethoxazole-Trimethoprim Rash    Review of Systems  Constitutional:  Negative for appetite change, fatigue and fever.  HENT:  Negative for congestion, ear pain, sinus pressure and sore throat.   Respiratory:  Negative for cough, chest tightness, shortness of breath and wheezing.   Cardiovascular:  Negative for chest pain and palpitations.  Gastrointestinal:  Negative for abdominal pain, constipation, diarrhea, nausea and vomiting.  Genitourinary:  Negative for dysuria and hematuria.       Urine odor  Musculoskeletal:  Negative for arthralgias, back pain, joint swelling and myalgias.  Skin:  Negative for rash.  Neurological:  Negative for dizziness, weakness and headaches.  Psychiatric/Behavioral:  Negative for dysphoric mood. The patient is not nervous/anxious.        Objective:        09/17/2023    9:37 AM  07/24/2023    9:15 AM 06/06/2023   10:37 AM  Vitals with BMI  Height 5' 6.75" 5' 6.75"   Weight 195 lbs 194 lbs 3 oz   BMI 30.79 30.66   Systolic 132 134  110  Diastolic 82 80 78  Pulse 88 80 83    Orthostatic VS for the past 72 hrs (Last 3 readings):  Patient Position BP Location  09/17/23 0937 Sitting Left Arm     Physical Exam Vitals reviewed.  Constitutional:      Appearance: Normal appearance.  Cardiovascular:     Rate and Rhythm: Normal rate and regular rhythm.     Heart sounds: Normal heart sounds.  Pulmonary:     Effort: Pulmonary effort is normal.     Breath sounds: Normal breath sounds.  Abdominal:     General: Bowel sounds are normal.     Palpations: Abdomen is soft.     Tenderness: There is no abdominal tenderness.  Neurological:     Mental Status: She is alert and oriented to person, place, and time.  Psychiatric:        Mood and Affect: Mood normal.        Behavior: Behavior normal.     Health Maintenance Due  Topic Date Due   Hepatitis C Screening  Never done   OPHTHALMOLOGY EXAM  08/03/2023    There are no preventive care reminders to display for this patient.   Lab Results  Component Value Date   TSH 2.560 12/28/2020   Lab Results  Component Value Date   WBC 10.0 04/10/2023   HGB 15.3 04/10/2023   HCT 45.8 04/10/2023   MCV 90 04/10/2023   PLT 326 04/10/2023   Lab Results  Component Value Date   NA 143 04/10/2023   K 4.3 04/10/2023   CO2 26 04/10/2023   GLUCOSE 145 (H) 04/10/2023   BUN 15 04/10/2023   CREATININE 0.96 04/10/2023   BILITOT 0.7 04/10/2023   ALKPHOS 95 04/10/2023   AST 22 04/10/2023   ALT 18 04/10/2023   PROT 6.9 04/10/2023   ALBUMIN 4.5 04/10/2023   CALCIUM 10.0 04/10/2023   EGFR 65 04/10/2023   Lab Results  Component Value Date   CHOL 210 (H) 04/10/2023   Lab Results  Component Value Date   HDL 69 04/10/2023   Lab Results  Component Value Date   LDLCALC 126 (H) 04/10/2023   Lab Results  Component  Value Date   TRIG 87 04/10/2023   Lab Results  Component Value Date   CHOLHDL 3.0 04/10/2023   Lab Results  Component Value Date   HGBA1C 7.8 (H) 04/10/2023       Assessment & Plan:  Abnormal urine odor Assessment & Plan: Early-stage UTI indicated by urinalysis. Frequent UTIs likely due to bladder spasms from dystonia. Limited antibiotic options due to allergies. Awaiting culture results to guide treatment. - Send urine for culture. - Consider Cipro based on culture results. - Follow up on urine culture results by Friday.  Orders: -     POCT URINALYSIS DIP (CLINITEK)  Acute cystitis with hematuria Assessment & Plan: Early-stage UTI indicated by urinalysis. Frequent UTIs likely due to bladder spasms from dystonia. Limited antibiotic options due to allergies. Awaiting culture results to guide treatment. - Send urine for culture. - Consider Cipro based on culture results. - Follow up on urine culture results by Friday.  Orders: -     Urine Culture  Type 2 diabetes mellitus without complication, with long-term current use of insulin (HCC) Assessment & Plan: Diabetes managed with Toujeo and Trulicity. Baclofen increases blood sugar, requiring Novolog for spikes. Trulicity adjusted to every six days for better control. - Continue Toujeo and Trulicity with current dosing schedule. -  Use Novolog as needed for blood sugar spikes.   Multiple Drug Allergies and Sensitivities Extensive drug allergies complicate medication management. Requires careful drug selection to avoid allergens. - Coordinate with pharmacist to ensure safe medication selection. - Avoid known allergens in medications.   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 Managed with Botox and baclofen, balancing with diabetes management. Airsupra inhaler effective without allergic reactions. - Continue Botox injections every 92 days. - Use baclofen as needed, monitor blood  sugar levels. - Continue Airsupra inhaler for laryngeal dystonia.    Urinary Tract Infection (UTI) Early-stage UTI indicated by urinalysis. Frequent UTIs likely due to bladder spasms from dystonia. Limited antibiotic options due to allergies. Awaiting culture results to guide treatment. - Send urine for culture. - Consider Cipro based on culture results. - Follow up on urine culture results by Friday.  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 Managed with Botox and baclofen, balancing with diabetes management. Airsupra inhaler effective without allergic reactions. - Continue Botox injections every 92 days. - Use baclofen as needed, monitor blood sugar levels. - Continue Airsupra inhaler for laryngeal dystonia.  Diabetes Mellitus Diabetes managed with Toujeo and Trulicity. Baclofen increases blood sugar, requiring Novolog for spikes. Trulicity adjusted to every six days for better control. - Continue Toujeo and Trulicity with current dosing schedule. - Use Novolog as needed for blood sugar spikes.  Multiple Drug Allergies and Sensitivities Extensive drug allergies complicate medication management. Requires careful drug selection to avoid allergens. - Coordinate with pharmacist to ensure safe medication selection. - Avoid known allergens in medications.        No orders of the defined types were placed in this encounter.   Orders Placed This Encounter  Procedures   Urine Culture   POCT URINALYSIS DIP (CLINITEK)     Follow-up: No follow-ups on file.  An After Visit Summary was printed and given to the patient.   I,Lauren M Auman,acting as a Neurosurgeon for [employer], [employer].,have documented all relevant documentation on the behalf of Langley Gauss, PA,as directed by  Langley Gauss, PA while in the presence of Langley Gauss, [place].    Langley Gauss, [place] [facility] [phone]

## 2023-09-17 NOTE — Assessment & Plan Note (Signed)
 Early-stage UTI indicated by urinalysis. Frequent UTIs likely due to bladder spasms from dystonia. Limited antibiotic options due to allergies. Awaiting culture results to guide treatment. - Send urine for culture. - Consider Cipro based on culture results. - Follow up on urine culture results by Friday.

## 2023-09-17 NOTE — Assessment & Plan Note (Signed)
 Diabetes managed with Toujeo and Trulicity. Baclofen increases blood sugar, requiring Novolog for spikes. Trulicity adjusted to every six days for better control. - Continue Toujeo and Trulicity with current dosing schedule. - Use Novolog as needed for blood sugar spikes.

## 2023-09-17 NOTE — Patient Instructions (Signed)
 VISIT SUMMARY:  During today's visit, we discussed your symptoms of a urinary tract infection (UTI), your ongoing management of cranial dystonia, diabetes, and your multiple drug allergies. We reviewed your current treatment plans and made some adjustments to ensure your conditions are managed effectively while avoiding allergens.  YOUR PLAN:  -URINARY TRACT INFECTION (UTI): A UTI is an infection in any part of your urinary system. Your symptoms and urinalysis suggest an early-stage UTI. We have sent your urine for culture to identify the best antibiotic for you, considering your allergies. We may consider Cipro based on the culture results. Please follow up on the urine culture results by Friday.  -CRANIAL DYSTONIA WITH LARYNGEAL SPASMS: Cranial dystonia is a condition that causes involuntary muscle contractions, leading to laryngeal spasms and voice issues. You are managing this with Botox injections every 92 days and occasional use of baclofen, which can affect your blood sugar levels. Continue using the Airsupra inhaler as it has been effective without causing allergic reactions.  -DIABETES MELLITUS: Diabetes is a condition that affects how your body processes blood sugar. You are managing it with Toujeo and Trulicity, and using Novolog for blood sugar spikes caused by baclofen. Continue with your current dosing schedule for Toujeo and Trulicity, and use Novolog as needed.  -MULTIPLE DRUG ALLERGIES AND SENSITIVITIES: You have multiple drug allergies that require careful selection of medications to avoid severe reactions. We will coordinate with your pharmacist to ensure all medications are safe for you and avoid known allergens.  INSTRUCTIONS:  Please follow up on the urine culture results by Friday. Continue with your current treatment plans and monitor your blood sugar levels closely, especially when using baclofen. If you experience any new symptoms or reactions, contact our office  immediately.

## 2023-09-20 ENCOUNTER — Encounter: Payer: Self-pay | Admitting: Physician Assistant

## 2023-09-22 ENCOUNTER — Encounter: Payer: Self-pay | Admitting: Physician Assistant

## 2023-09-22 ENCOUNTER — Other Ambulatory Visit: Payer: Self-pay | Admitting: Physician Assistant

## 2023-09-22 DIAGNOSIS — N3001 Acute cystitis with hematuria: Secondary | ICD-10-CM

## 2023-09-22 LAB — URINE CULTURE

## 2023-09-22 MED ORDER — CIPROFLOXACIN HCL 500 MG PO TABS: 500.0000 mg | ORAL_TABLET | Freq: Two times a day (BID) | ORAL | 0 refills | Status: DC

## 2023-09-22 MED ORDER — DOXYCYCLINE HYCLATE 100 MG PO TABS: 100.0000 mg | ORAL_TABLET | Freq: Two times a day (BID) | ORAL | 0 refills | Status: DC

## 2023-09-23 ENCOUNTER — Other Ambulatory Visit: Payer: Self-pay | Admitting: Physician Assistant

## 2023-09-23 MED ORDER — CIPROFLOXACIN HCL 500 MG PO TABS: 500.0000 mg | ORAL_TABLET | Freq: Two times a day (BID) | ORAL | 0 refills | Status: AC

## 2023-10-07 ENCOUNTER — Encounter: Payer: Self-pay | Admitting: Family Medicine

## 2023-10-07 NOTE — Progress Notes (Unsigned)
 Subjective:  Patient ID: Suzanne Hardin, female    DOB: 1955-11-16  Age: 68 y.o. MRN: [id number]  Chief Complaint  Patient presents with   Medical Management of Chronic Issues   Discussed the use of AI scribe software for clinical note transcription with the patient, who gave verbal consent to proceed.  History of Present Illness   The patient, with a history of cranial dystonia and diabetes, presents for a routine follow-up. She reports that her dystonia symptoms have been well-managed with Botox injections and she has discovered certain maneuvers that help alleviate her symptoms. She also mentions that she has been using airsupra .  Regarding her diabetes, the patient has been managing her blood sugars with Toujeo  insulin and Trulicity . She reports that her sugars have been running between 116 to 120 this week. She also mentions that she has been experiencing recurrent bladder issues, but she has recently finished a course of Cipro  which has helped.  The patient also discusses her history of a reaction to a flu vaccine in 2014, which she believes led to the onset of her dystonia. She reports that she has been through vaccine court and received a settlement. She also mentions having a thick cornea which has led to falsely elevated pressure readings in her eyes, but her pressures have been well-managed recently.     Diabetes:  Complications: nephropathy Glucose checking: twice daily  Glucose logs: 116-120.  Hypoglycemia: no Most recent A1C: 7.8% Current medications: Dulaglutide1.5 mg two times per week ( taking every 6 days) Toujeo  16 units in the morning and 16 in the evening. Novolog  sliding scale 2-3 times per day. Last Eye Exam: 07/27/2023. Dr. Marchia Seton at [facility] in [place].  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   Hypertension: Current medications: None   Cranial dystonia: Laurice Pope, MD   Giving botox. Airsupra  has helped. On Baclofen. Has found some alleviating maneuvers. Occurred after quadrivalent influenza vaccine given October 15th, 2014 at 8:30 am.      10/11/2023   10:10 AM 02/12/2023    9:37 AM 09/06/2022   10:32 AM 08/17/2022    8:26 AM 05/15/2021   11:24 AM  Depression screen PHQ 2/9  Decreased Interest 0 0 0 0 0  Down, Depressed, Hopeless 0 0 0 0 0  PHQ - 2 Score 0 0 0 0 0        10/11/2023   10:09 AM  Fall Risk   Falls in the past year? 0  Number falls in past yr: 0  Injury with Fall? 0  Risk for fall due to : No Fall Risks  Follow up Falls evaluation completed;Education provided    Patient Care Team: Mercy Stall, MD as PCP - [facility]   Review of Systems  Constitutional:  Negative for chills, fatigue and fever.  HENT:  Negative for congestion, ear pain and sore throat.   Respiratory:  Negative for cough and shortness of breath.   Cardiovascular:  Negative for chest pain and palpitations.  Gastrointestinal:  Negative for abdominal pain, constipation, diarrhea, nausea and vomiting.  Endocrine: Negative for polydipsia, polyphagia and polyuria.  Genitourinary:  Negative for difficulty urinating and dysuria.  Musculoskeletal:  Negative for arthralgias, back pain and myalgias.  Skin:  Negative for rash.  Neurological:  Negative for headaches.  Psychiatric/Behavioral:  Negative for dysphoric mood. The patient is not nervous/anxious.     Current Outpatient  Medications on File Prior to Visit  Medication Sig Dispense Refill   Albuterol -Budesonide (AIRSUPRA ) 90-80 MCG/ACT AERO INHALE 2 PUFFS INTO THE LUNGS 4 TIMES DAILY AS NEEDED. 10.7 g 2   Alpha-Lipoic Acid 300 MG CAPS Take 1 capsule by mouth daily.     baclofen (LIORESAL) 10 MG tablet Take 10 mg by mouth 3 (three) times daily as needed.     Blood Glucose Calibration (ONETOUCH VERIO) SOLN 1 each by In Vitro route every 30 (thirty) days. 1 each 2   Blood Glucose  Monitoring Suppl (ONETOUCH VERIO) w/Device KIT 1 each by Does not apply route 3 (three) times daily. 1 kit 0   chlorpheniramine (CHLOR-TRIMETON) 4 MG tablet Take 1 mg by mouth every 6 (six) hours as needed for allergies.     Cholecalciferol (VITAMIN D ) 125 MCG (5000 UT) CAPS Take 5,000 Units by mouth 2 (two) times a week.     CONTOUR NEXT TEST test strip USE AS DIRECTED 300 strip 2   Cyanocobalamin 1000 MCG TBCR Take 1 tablet by mouth every other day.     D-Mannose POWD Take by mouth as needed. (Patient not taking: Reported on 10/11/2023)     hydrOXYzine (ATARAX) 10 MG/5ML syrup Take by mouth.     ibuprofen (ADVIL) 200 MG tablet Take 200 mg by mouth every 6 (six) hours as needed. (Patient not taking: Reported on 10/11/2023)     Lancets MISC by Does not apply route.     NOVOLOG  FLEXPEN 100 UNIT/ML FlexPen Patient uses slidding scale. Maximum 30 units uses only blood sugar >250mg /dl. Using 8-10 units when sugars are high lately. (Patient taking differently: Patient uses sliding scale. Maximum 30 units uses only blood sugar >250mg /dl. Using 8-10 units when sugars are high lately.) 15 mL 1   pyridOXINE (VITAMIN B-6) 50 MG tablet Take 50 mg by mouth every other day. Takes the p5pb6 version     TOUJEO  SOLOSTAR 300 UNIT/ML Solostar Pen Inject 23 Units into the skin daily. (Patient taking differently: Inject 20 Units into the skin in the morning and at bedtime. 10-16 units in the morning and 10-16 in the evening) 6 pen 6   TRIAMCINOLONE ACETONIDE, TOP, 0.05 % OINT Apply topically daily as needed. Uses as needed for rash, bug bites or on gums before dentist.     TRULICITY  1.5 MG/0.5ML SOAJ INJECT 1.5 MG (0.5ML) UNDER THE SKIN TWICE WEEKLY. MAX WEEKLY DOSE 3MG . 16 mL 0   No current facility-administered medications on file prior to visit.   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 Food, (peanuts, bell peppers, raw onions,pickles) Drug, and Skin, environmental, fragrances & air freshners   Type 2 diabetes mellitus with other specified complication (HCC) 09/16/2019   Past Surgical History:  Procedure Laterality Date   APPENDECTOMY  2000   CATARACT EXTRACTION, BILATERAL     CHOLECYSTECTOMY  2000   TONSILLECTOMY AND ADENOIDECTOMY      Family History  Problem Relation Age of Onset   Ulcers Father    Peripheral Artery Disease Father  Breast cancer Maternal Aunt    Breast cancer Maternal Aunt    Breast cancer Cousin    Social History   Socioeconomic History   Marital status: Divorced    Spouse name: Not on file   Number of children: 2   Years of education: Not on file   Highest education level: Not on file  Occupational History   Occupation: Disabled  Tobacco Use   Smoking status: Never   Smokeless tobacco: Never  Vaping Use   Vaping status: Never Used  Substance and Sexual Activity   Alcohol use: Never   Drug use: Never   Sexual activity: Yes    Partners: Male  Other Topics Concern   Not on file  Social History Narrative   Not on file   Social Drivers of Health   Financial Resource Strain: Low Risk  (02/12/2023)   Overall Financial Resource Strain (CARDIA)    Difficulty of Paying Living Expenses: Not hard at all  Food Insecurity: No Food Insecurity (10/11/2023)   Hunger Vital Sign    Worried About Running Out of Food in the Last Year: Never true    Ran Out of Food in the Last Year: Never true  Transportation Needs: No Transportation Needs (10/11/2023)   PRAPARE - Administrator, Civil Service (Medical): No    Lack of  Transportation (Non-Medical): No  Physical Activity: Sufficiently Active (02/12/2023)   Exercise Vital Sign    Days of Exercise per Week: 5 days    Minutes of Exercise per Session: 150+ min  Stress: No Stress Concern Present (02/12/2023)   [employer] of Occupational Health - Occupational Stress Questionnaire    Feeling of Stress : Not at all  Social Connections: Moderately Integrated (03/25/2023)   Social Connection and Isolation Panel [NHANES]    Frequency of Communication with Friends and Family: More than three times a week    Frequency of Social Gatherings with Friends and Family: More than three times a week    Attends Religious Services: More than 4 times per year    Active Member of [employer] or Organizations: Yes    Attends Banker Meetings: 1 to 4 times per year    Marital Status: Divorced    Objective:  BP 122/86   Pulse 68   Temp (!) 97.1 F (36.2 C)   Resp 14   Ht 5' 6.75" (1.695 m)   Wt 194 lb (88 kg)   SpO2 97%   BMI 30.61 kg/m      10/11/2023    9:53 AM 10/08/2023    8:19 AM 09/17/2023    9:37 AM  BP/Weight  Systolic BP 120 122 132  Diastolic BP 68 86 82  Wt. (Lbs)  194 195  BMI  30.61 kg/m2 30.77 kg/m2    Physical Exam Vitals reviewed.  Constitutional:      Appearance: Normal appearance. She is normal weight.  Neck:     Vascular: No carotid bruit.  Cardiovascular:     Rate and Rhythm: Normal rate and regular rhythm.     Pulses: Normal pulses.     Heart sounds: Normal heart sounds.  Pulmonary:     Effort: Pulmonary effort is normal. No respiratory distress.     Breath sounds: Normal breath sounds.  Abdominal:     General: Abdomen is flat. Bowel sounds are normal.     Palpations: Abdomen is soft.     Tenderness: There is no abdominal tenderness.  Neurological:  Mental Status: She is alert and oriented to person, place, and time.  Psychiatric:        Mood and Affect: Mood normal.        Behavior: Behavior normal.      Diabetic Foot Exam - Simple   Simple Foot Form Diabetic Foot exam was performed with the following findings: Yes 10/08/2023  8:55 AM  Visual Inspection No deformities, no ulcerations, no other skin breakdown bilaterally: Yes Sensation Testing Intact to touch and monofilament testing bilaterally: Yes Pulse Check Posterior Tibialis and Dorsalis pulse intact bilaterally: Yes Comments      Lab Results  Component Value Date   WBC 7.2 10/08/2023   HGB 15.5 10/08/2023   HCT 46.6 10/08/2023   PLT 315 10/08/2023   GLUCOSE 126 (H) 10/08/2023   CHOL 213 (H) 10/08/2023   TRIG 102 10/08/2023   HDL 68 10/08/2023   LDLCALC 127 (H) 10/08/2023   ALT 17 10/08/2023   AST 23 10/08/2023   NA 144 10/08/2023   K 4.3 10/08/2023   CL 103 10/08/2023   CREATININE 1.11 (H) 10/08/2023   BUN 14 10/08/2023   CO2 25 10/08/2023   TSH 2.650 10/08/2023   HGBA1C 7.3 (H) 10/08/2023   MICROALBUR 80 12/28/2020      Assessment & Plan:   Essential hypertension Assessment & Plan: Controlled off medicines. Continue to work on eating a healthy diet and exercise.  Labs drawn today.    Orders: -     CBC with Differential/Platelet -     Comprehensive metabolic panel with GFR  Diabetic glomerulopathy (HCC) Assessment & Plan: Diabetes managed with Toujeo  and Trulicity . Baclofen increases blood sugar, requiring Novolog  for spikes. Trulicity  adjusted to every six days for better control. - Continue Toujeo  and Trulicity  with current dosing schedule. - Use Novolog  as needed for blood sugar spikes.  Orders: -     Hemoglobin A1c -     Microalbumin / creatinine urine ratio  Mixed hyperlipidemia Assessment & Plan: Not at goal. Intolerant to numerous medicines. Continue to work on eating a healthy diet and exercise.  Labs drawn today.    Orders: -     Lipid panel -     TSH  Vitamin D  deficiency Assessment & Plan: Check labs  Orders: -     VITAMIN D  25 Hydroxy (Vit-D Deficiency,  Fractures)  Statin myopathy Assessment & Plan: Intolerant to statins.    Deficiency of vitamin B12 Assessment & Plan: Check labs  Orders: -     Vitamin B12  Need for hepatitis C screening test -     HCV Ab w Reflex to Quant PCR  Moderate persistent asthma, unspecified whether complicated Assessment & Plan: Stable. Continue airsupra .    Other orders -     Interpretation:     No orders of the defined types were placed in this encounter.   Orders Placed This Encounter  Procedures   CBC with Differential/Platelet   Comprehensive metabolic panel with GFR   Hemoglobin A1c   Lipid panel   Microalbumin / creatinine urine ratio   VITAMIN D  25 Hydroxy (Vit-D Deficiency, Fractures)   Vitamin B12   TSH   HCV Ab w Reflex to Quant PCR   Interpretation:     Follow-up: Return in about 3 months (around 01/07/2024) for chronic follow up.   Gladys Lamp I Leal-Borjas,acting as a scribe for Mercy Stall, MD.,have documented all relevant documentation on the behalf of Mercy Stall, MD,as directed by  Mercy Stall, MD  while in the presence of Mercy Stall, MD.   An After Visit Summary was printed and given to the patient.  I attest that I have reviewed this visit and agree with the plan scribed by my staff.   Mercy Stall, MD [facility] [phone]

## 2023-10-08 ENCOUNTER — Ambulatory Visit (INDEPENDENT_AMBULATORY_CARE_PROVIDER_SITE_OTHER): Payer: Medicare Other | Admitting: Family Medicine

## 2023-10-08 ENCOUNTER — Encounter: Payer: Self-pay | Admitting: Family Medicine

## 2023-10-08 VITALS — BP 122/86 | HR 68 | Temp 97.1°F | Resp 14 | Ht 66.75 in | Wt 194.0 lb

## 2023-10-08 DIAGNOSIS — G720 Drug-induced myopathy: Secondary | ICD-10-CM

## 2023-10-08 DIAGNOSIS — I10 Essential (primary) hypertension: Secondary | ICD-10-CM

## 2023-10-08 DIAGNOSIS — T466X5A Adverse effect of antihyperlipidemic and antiarteriosclerotic drugs, initial encounter: Secondary | ICD-10-CM

## 2023-10-08 DIAGNOSIS — J4540 Moderate persistent asthma, uncomplicated: Secondary | ICD-10-CM

## 2023-10-08 DIAGNOSIS — E782 Mixed hyperlipidemia: Secondary | ICD-10-CM | POA: Diagnosis not present

## 2023-10-08 DIAGNOSIS — Z1159 Encounter for screening for other viral diseases: Secondary | ICD-10-CM

## 2023-10-08 DIAGNOSIS — E538 Deficiency of other specified B group vitamins: Secondary | ICD-10-CM

## 2023-10-08 DIAGNOSIS — E559 Vitamin D deficiency, unspecified: Secondary | ICD-10-CM | POA: Diagnosis not present

## 2023-10-08 DIAGNOSIS — E1121 Type 2 diabetes mellitus with diabetic nephropathy: Secondary | ICD-10-CM

## 2023-10-09 ENCOUNTER — Encounter: Payer: Self-pay | Admitting: Family Medicine

## 2023-10-09 LAB — HCV INTERPRETATION

## 2023-10-09 LAB — CBC WITH DIFFERENTIAL/PLATELET
Basophils Absolute: 0.1 10*3/uL (ref 0.0–0.2)
Basos: 1 %
EOS (ABSOLUTE): 0.3 10*3/uL (ref 0.0–0.4)
Eos: 4 %
Hematocrit: 46.6 % (ref 34.0–46.6)
Hemoglobin: 15.5 g/dL (ref 11.1–15.9)
Immature Grans (Abs): 0 10*3/uL (ref 0.0–0.1)
Immature Granulocytes: 0 %
Lymphocytes Absolute: 2.9 10*3/uL (ref 0.7–3.1)
Lymphs: 40 %
MCH: 29.8 pg (ref 26.6–33.0)
MCHC: 33.3 g/dL (ref 31.5–35.7)
MCV: 89 fL (ref 79–97)
Monocytes Absolute: 0.3 10*3/uL (ref 0.1–0.9)
Monocytes: 4 %
Neutrophils Absolute: 3.7 10*3/uL (ref 1.4–7.0)
Neutrophils: 51 %
Platelets: 315 10*3/uL (ref 150–450)
RBC: 5.21 x10E6/uL (ref 3.77–5.28)
RDW: 13.1 % (ref 11.7–15.4)
WBC: 7.2 10*3/uL (ref 3.4–10.8)

## 2023-10-09 LAB — COMPREHENSIVE METABOLIC PANEL WITH GFR
ALT: 17 IU/L (ref 0–32)
AST: 23 IU/L (ref 0–40)
Albumin: 4.9 g/dL (ref 3.9–4.9)
Alkaline Phosphatase: 97 IU/L (ref 44–121)
BUN/Creatinine Ratio: 13 (ref 12–28)
BUN: 14 mg/dL (ref 8–27)
Bilirubin Total: 0.6 mg/dL (ref 0.0–1.2)
CO2: 25 mmol/L (ref 20–29)
Calcium: 10.3 mg/dL (ref 8.7–10.3)
Chloride: 103 mmol/L (ref 96–106)
Creatinine, Ser: 1.11 mg/dL — ABNORMAL HIGH (ref 0.57–1.00)
Globulin, Total: 2.5 g/dL (ref 1.5–4.5)
Glucose: 126 mg/dL — ABNORMAL HIGH (ref 70–99)
Potassium: 4.3 mmol/L (ref 3.5–5.2)
Sodium: 144 mmol/L (ref 134–144)
Total Protein: 7.4 g/dL (ref 6.0–8.5)
eGFR: 55 mL/min/{1.73_m2} — ABNORMAL LOW (ref >59)

## 2023-10-09 LAB — MICROALBUMIN / CREATININE URINE RATIO
Creatinine, Urine: 216.1 mg/dL
Microalb/Creat Ratio: 63 mg/g{creat} — ABNORMAL HIGH (ref 0–29)
Microalbumin, Urine: 135.8 ug/mL

## 2023-10-09 LAB — VITAMIN D 25 HYDROXY (VIT D DEFICIENCY, FRACTURES): Vit D, 25-Hydroxy: 46.2 ng/mL (ref 30.0–100.0)

## 2023-10-09 LAB — TSH: TSH: 2.65 u[IU]/mL (ref 0.450–4.500)

## 2023-10-09 LAB — HEMOGLOBIN A1C
Est. average glucose Bld gHb Est-mCnc: 163 mg/dL
Hgb A1c MFr Bld: 7.3 % — ABNORMAL HIGH (ref 4.8–5.6)

## 2023-10-09 LAB — LIPID PANEL
Chol/HDL Ratio: 3.1 ratio (ref 0.0–4.4)
Cholesterol, Total: 213 mg/dL — ABNORMAL HIGH (ref 100–199)
HDL: 68 mg/dL (ref >39)
LDL Chol Calc (NIH): 127 mg/dL — ABNORMAL HIGH (ref 0–99)
Triglycerides: 102 mg/dL (ref 0–149)
VLDL Cholesterol Cal: 18 mg/dL (ref 5–40)

## 2023-10-09 LAB — VITAMIN B12: Vitamin B-12: 1159 pg/mL (ref 232–1245)

## 2023-10-09 LAB — HCV AB W REFLEX TO QUANT PCR: HCV Ab: NONREACTIVE

## 2023-10-10 ENCOUNTER — Encounter: Payer: Self-pay | Admitting: Family Medicine

## 2023-10-11 ENCOUNTER — Other Ambulatory Visit: Payer: Self-pay | Admitting: *Deleted

## 2023-10-11 ENCOUNTER — Encounter: Payer: Self-pay | Admitting: *Deleted

## 2023-10-11 ENCOUNTER — Other Ambulatory Visit: Payer: Self-pay

## 2023-10-11 NOTE — Patient Instructions (Signed)
 Visit Information  Thank you for taking time to visit with me today. Please don't hesitate to contact me if I can be of assistance to you before our next scheduled appointment.  Your next care management appointment is by telephone on 11-11-2023 at 9:00 am  Telephone follow-up in 1 month  Please call the care guide team at [phone] if you need to cancel, schedule, or reschedule an appointment.   Please call the Suicide and Crisis Lifeline: 988 call the [place]  National Suicide Prevention Lifeline: 4055092161 or TTY: (937)687-2408 TTY 480-594-5935) to talk to a trained counselor call 1-800-273-TALK (toll free, 24 hour hotline) call 911 if you are experiencing a Mental Health or Behavioral Health Crisis or need someone to talk to.   Grandville Lax, BSN RN [facility], [facility] RN Care Manager Direct Dial: [phone]  Fax: [phone]

## 2023-10-12 DIAGNOSIS — E538 Deficiency of other specified B group vitamins: Secondary | ICD-10-CM | POA: Insufficient documentation

## 2023-10-12 NOTE — Assessment & Plan Note (Signed)
 Check labs

## 2023-10-12 NOTE — Assessment & Plan Note (Signed)
Controlled off medicines.  Continue to work on eating a healthy diet and exercise.  Labs drawn today.  

## 2023-10-12 NOTE — Assessment & Plan Note (Signed)
 Diabetes managed with Toujeo and Trulicity. Baclofen increases blood sugar, requiring Novolog for spikes. Trulicity adjusted to every six days for better control. - Continue Toujeo and Trulicity with current dosing schedule. - Use Novolog as needed for blood sugar spikes.

## 2023-10-12 NOTE — Assessment & Plan Note (Signed)
 Intolerant to statins.

## 2023-10-12 NOTE — Assessment & Plan Note (Addendum)
 Well controlled.  No medicines.  Continue to work on eating a healthy diet and exercise.  Labs drawn today.

## 2023-10-13 NOTE — Assessment & Plan Note (Signed)
 Stable. Continue airsupra .

## 2023-10-22 ENCOUNTER — Other Ambulatory Visit: Payer: Medicare Other | Admitting: Pharmacist

## 2023-10-22 ENCOUNTER — Telehealth: Payer: Self-pay

## 2023-10-22 NOTE — Progress Notes (Signed)
   10/22/2023  Patient ID: Suzanne Hardin, female   DOB: 07/08/1955, 68 y.o.   MRN: [id number]  Called and spoke with the patient on the phone.   She reports taking Trulicity  every 6 days now rather than the twice every week. Doing well with blood sugars. Only taking Novolog  as needed for high BG due to fluid retention when she takes and allergy to fluid pills.  For cholesterol medications, feels Repatha/Praluent is extreme with current levels. May consider new testing for LDL particle testing to evaluate new risk if still high after next lipid panel checked. Will continue focusing on diet for now.  Advised Nexletol is also an option to consider. Reviewed some of the main ingredients on the website. Seems to be okay option to potentially consider in the future, but states she may need to call manufacturer for better understanding of full chemicals within the medication. Has plenty of time to review and consider. Only other option is red yeast rice, which can cause liver complications.  For kidney function, not on any medications that should be contributing to decrease. However, recurrent UTI's from dystonia and antibiotic use is likely causing the decline. No recommendations to assist currently.   No follow-up scheduled at this time. Aware to let Dr. Reinhold Carbine know if follow-up needed to discuss new medication in the future.    Delvin File, PharmD [facility]  Phone Number: [phone]

## 2023-10-22 NOTE — Telephone Encounter (Signed)
 CALLED AND INFORMED PATIENT THAT WE HAVE RECEIVED HER PATIENT ASSISTANCE FOR HER AIRSUPRA  AND IT IS READY FOR PICK UP.  RECEIVED:  AIRSUPRA  90-80 MCG INHALER (10.7GM) #3 INHALERS NDC: [id number]  LOTS: [id number]/32.100

## 2023-10-23 NOTE — Telephone Encounter (Signed)
 PATIENT PICKED UP PATIENT ASSISTANCE.

## 2023-11-05 ENCOUNTER — Telehealth: Payer: Self-pay

## 2023-11-05 NOTE — Telephone Encounter (Signed)
 Patient Assistance received and picked up by patient.  Toujeo  Solostar  300 Units/ml 3- 1.27ml #2 boxes   NDC# 9604-5409-81 Lot# [id number] Exp: 07/25/2025  Patient picked up same day.

## 2023-11-06 ENCOUNTER — Other Ambulatory Visit: Payer: Self-pay | Admitting: Family Medicine

## 2023-11-06 DIAGNOSIS — Z794 Long term (current) use of insulin: Secondary | ICD-10-CM

## 2023-11-11 ENCOUNTER — Other Ambulatory Visit: Payer: Self-pay

## 2023-11-11 VITALS — BP 117/70 | HR 68 | Wt 189.0 lb

## 2023-11-11 DIAGNOSIS — E782 Mixed hyperlipidemia: Secondary | ICD-10-CM

## 2023-11-11 NOTE — Patient Outreach (Signed)
 Complex Care Management   Visit Note  11/11/2023  Name:  Suzanne Hardin MRN: [id number] DOB: 30-Oct-1955  Situation: Referral received for Complex Care Management related to Diabetes with Complications and HLD HTN I obtained verbal consent from Patient.  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 Food, (peanuts, bell peppers, raw onions,pickles) Drug, and Skin, environmental, fragrances & air freshners   Type 2 diabetes mellitus with other specified complication (HCC) 09/16/2019    Assessment: Patient Reported Symptoms:  Cognitive Cognitive Status: Alert and oriented to person, place, and time      Neurological Neurological Review of Symptoms: No symptoms reported    HEENT HEENT Symptoms Reported: Nasal discharge, Sore throat (due to seasonal allergies) HEENT Management Strategies: Routine screening, Medication therapy    Cardiovascular Cardiovascular Symptoms Reported: No symptoms reported Does patient have uncontrolled Hypertension?: Yes Is patient checking Blood Pressure at home?: Yes Patient's Recent BP reading at home: 117/70 Cardiovascular Conditions: Hypertension, High blood cholesterol Cardiovascular Management Strategies: Medication therapy, Routine screening Weight: 189 lb (85.7 kg) Cardiovascular Self-Management Outcome: 4 (good)  Respiratory Respiratory Symptoms Reported: Wheezing (seasonal allergies) Respiratory Conditions: Seasonal allergies  Endocrine Patient reports the  following symptoms related to hypoglycemia or hyperglycemia : No symptoms reported Is patient diabetic?: Yes Is patient checking blood sugars at home?: Yes Endocrine Conditions: Diabetes Endocrine Management Strategies: Medical device, Medication therapy, Coping strategies, Weight management Endocrine Self-Management Outcome: 4 (good)  Gastrointestinal Gastrointestinal Symptoms Reported: No symptoms reported Additional Gastrointestinal Details: intermittent constipation/diarrhea   s/p choleystectomy Gastrointestinal Management Strategies: Diet modification, Medication therapy Nutrition Risk Screen (CP): No indicators present  Genitourinary Genitourinary Symptoms Reported: No symptoms reported Additional Genitourinary Details: hx UTI  no current symptoms Genitourinary Conditions: Urinary tract infection Genitourinary Management Strategies: Medication therapy  Integumentary Integumentary Symptoms Reported: No symptoms reported    Musculoskeletal Additional Musculoskeletal Details: dystonia Musculoskeletal Conditions: Other Other Musculoskeletal Conditions: dystonia Musculoskeletal Management Strategies: Coping strategies Musculoskeletal Self-Management Outcome: 3 (uncertain) Falls in the past year?: No Patient at Risk for Falls Due to: Impaired balance/gait, Impaired mobility Fall risk Follow up: Falls prevention discussed  Psychosocial   Behavioral Health Self-Management Outcome: 3 (uncertain) Major Change/Loss/Stressor/Fears (CP): Medical condition, self Techniques to Cope with Loss/Stress/Change: Diversional activities Quality of Family Relationships: helpful, involved, supportive Do you feel physically threatened by others?: No      10/11/2023   10:10 AM  Depression screen PHQ 2/9  Decreased Interest 0  Down, Depressed, Hopeless 0  PHQ - 2 Score 0    Vitals:   11/11/23 0920  BP: 117/70  Pulse: 68  SpO2: 97%    Medications Reviewed Today     Reviewed by Clarnce Crow,  RN (Registered Nurse) on 11/11/23 at [phone]  Med List Status: <None>   Medication Order Taking? Sig Documenting Provider Last Dose Status Informant  Albuterol -Budesonide (AIRSUPRA ) 90-80 MCG/ACT AERO [id number] Yes INHALE 2 PUFFS INTO THE LUNGS 4 TIMES DAILY AS NEEDED. Cox, Kirsten, MD Taking Active   Alpha-Lipoic Acid 300 MG  CAPS [id number] Yes Take 1 capsule by mouth daily. [provider] Taking Active            Med Note Laverna Pott, Sue Em   Thu Jun 06, 2023 10:39 AM) For neuropathy  baclofen (LIORESAL) 10 MG tablet [id number] Yes Take 10 mg by mouth 3 (three) times daily as needed. [provider] Taking Active   Blood Glucose Calibration (ONETOUCH VERIO) SOLN [id number]  1 each by In Vitro route every 30 (thirty) days. Audie Bleacher, MD  Active   Blood Glucose Monitoring Suppl [facility] VERIO) w/Device Suzanne Erps [id number] Yes 1 each by Does not apply route 3 (three) times daily. Audie Bleacher, MD Taking Active   chlorpheniramine (CHLOR-TRIMETON) 4 MG tablet [id number] Yes Take 1 mg by mouth every 6 (six) hours as needed for allergies. [provider] Taking Active   Cholecalciferol (VITAMIN D ) 125 MCG (5000 UT) CAPS [id number] Yes Take 5,000 Units by mouth 2 (two) times a week. [provider] Taking Active            Med Note Laverna Pott, Sue Em   Thu Jun 06, 2023 10:41 AM) Taking twice a week  CONTOUR NEXT TEST test strip [id number] Yes USE AS Henderson Lock, MD Taking Active   Cyanocobalamin 1000 MCG TBCR [id number] Yes Take 1 tablet by mouth every other day. [provider] Taking Active   D-Mannose POWD [id number] Yes Take by mouth as needed. [provider] Taking Active            Med Note Laverna Pott, April Bayard Jun 06, 2023 10:43 AM) Starts taking when infections recur.   hydrOXYzine (ATARAX) 10 MG/5ML syrup [id number] Yes Take by mouth. [provider] Taking Active            Med Note Dorrine Gaudy Mar 28, 2023 10:24 AM) Only as needed for food allergy reactions  ibuprofen (ADVIL) 200 MG tablet [id number] No Take 200 mg by mouth every 6 (six) hours as needed.  Patient not taking: Reported on 11/11/2023   [provider] Not Taking Active   Lancets MISC [id number] Yes by Does not apply route. [provider] Taking Active   NOVOLOG  FLEXPEN 100 UNIT/ML FlexPen [id number] Yes Patient uses slidding scale. Maximum 30 units uses only blood sugar >250mg /dl. Using 8-10 units when sugars are high lately.  Patient taking differently: Patient uses sliding scale. Maximum 30 units uses only blood sugar >250mg /dl. Using 8-10 units when sugars are high lately.   Audie Bleacher, MD Taking Active   pyridOXINE (VITAMIN B-6) 50 MG tablet [id number] Yes Take 50 mg by mouth every other day. Takes the p5pb6 version [provider] Taking Active            Med Note Laverna Pott, Sue Em   Thu Jun 06, 2023 10:45 AM) For fluid retention every day usually; daily if needed  TOUJEO  SOLOSTAR 300 UNIT/ML Solostar Pen [id number] Yes Inject 23 Units into the skin daily.  Patient taking differently: Inject 20 Units into the skin in the morning and at bedtime. 10-16 units in the morning and 10-16 in the evening   Audie Bleacher, MD Taking Active            Med Note Burley Carpenter, Micaiah Litle   Mon Nov 11, 2023  9:25 AM) Patient reports taking 18 units daily  TRIAMCINOLONE ACETONIDE, TOP, 0.05 % OINT [id number] Yes Apply topically daily as needed. Uses as needed  for rash, bug bites or on gums before dentist. [provider] Taking Active   TRULICITY  1.5 MG/0.5ML [place] [id number] Yes INJECT 1.5 MG (0.5ML) UNDER THE SKIN TWICE A WEEK. MAX WEEKLY DOSE 3MG . Cox, Kirsten, MD Taking Active             Recommendation:   PCP Follow-up Referral to: Clinical Pharmacist for polypharmacy  Follow Up Plan:   Telephone follow-up in 1 month   Clarnce Crow BSN RN CCM   [facility],  [facility] RN Care Manager Direct Dial: [phone] Fax: [phone]

## 2023-11-11 NOTE — Patient Instructions (Signed)
 Visit Information  Thank you for taking time to visit with me today. Please don't hesitate to contact me if I can be of assistance to you before our next scheduled appointment.  Our next appointment is by telephone on 12/12/23 at 9:00 Please call the care guide team at [phone] if you need to cancel or reschedule your appointment.   Following is a copy of your care plan:   Goals Addressed             This Visit's Progress    VBCI RN Care Plan: DM   On track    Problems:  Chronic Disease Management support and education needs related to DMII, HLD, and HTN  Goal: Over the next 90 days the Patient will attend all scheduled medical appointments: with primary care provider and specialist as evidenced by keeping all scheduled appointments        take all medications exactly as prescribed and will call provider for medication related questions as evidenced by compliance with all medications    verbalize basic understanding of DMII disease process and self health management plan as evidenced by verbal explanation, recognizing symptoms, lifestyle changes  Interventions:   Diabetes Interventions: Assessed patient's understanding of A1c goal: <6.5% Provided education to patient about basic DM disease process Reviewed medications with patient and discussed importance of medication adherence Counseled on importance of regular laboratory monitoring as prescribed Discussed plans with patient for ongoing care management follow up and provided patient with direct contact information for care management team Provided patient with written educational materials related to hypo and hyperglycemia and importance of correct treatment Reviewed scheduled/upcoming provider appointments including: 01-08-2024 with PCP Advised patient, providing education and rationale, to check cbg at minimum daily and record, calling provider for findings outside established parameters Review of patient status, including  review of consultants reports, relevant laboratory and other test results, and medications completed Screening for signs and symptoms of depression related to chronic disease state  Assessed social determinant of health barriers Lab Results  Component Value Date   HGBA1C 7.3 (H) 10/08/2023    Patient Self-Care Activities:  Attend all scheduled provider appointments Call pharmacy for medication refills 3-7 days in advance of running out of medications Call provider office for new concerns or questions  Perform all self care activities independently  Perform IADL's (shopping, preparing meals, housekeeping, managing finances) independently Take medications as prescribed   check blood sugar at prescribed times: before meals and at bedtime, once daily, and when you have symptoms of low or high blood sugar check feet daily for cuts, sores or redness fill half of plate with vegetables manage portion size  Plan:  Telephone follow up appointment with care management team member scheduled for:  6/19-2025 at 9:00 am             Please call the Suicide and Crisis Lifeline: 988 call the [place]  National Suicide Prevention Lifeline: (913) 830-3814 or TTY: (551)141-8218 TTY 910-684-0650) to talk to a trained counselor call 1-800-273-TALK (toll free, 24 hour hotline) if you are experiencing a Mental Health or Behavioral Health Crisis or need someone to talk to.  Patient verbalizes understanding of instructions and care plan provided today and agrees to view in MyChart. Active MyChart status and patient understanding of how to access instructions and care plan via MyChart confirmed with patient.      Clarnce Crow BSN RN CCM [place]  [facility], [facility] RN Care Manager Direct Dial: [phone] Fax: [phone]

## 2023-11-13 ENCOUNTER — Telehealth: Payer: Self-pay

## 2023-11-13 NOTE — Telephone Encounter (Signed)
 PATIENT PICKED UP PATIENT ASSISTANCE.

## 2023-11-13 NOTE — Telephone Encounter (Signed)
 Patient aware of trulicity  (8 boxes) are ready for pick up.

## 2023-12-03 ENCOUNTER — Telehealth: Payer: Self-pay

## 2023-12-03 NOTE — Telephone Encounter (Signed)
 Patient informed that PAP for novolog  is here for pick up [id number]. 3 boxes of Novolog  and 2 boxes of needles.

## 2023-12-05 NOTE — Telephone Encounter (Signed)
 Patient picked up patient assistance

## 2023-12-10 ENCOUNTER — Encounter: Payer: Self-pay | Admitting: Family Medicine

## 2023-12-10 ENCOUNTER — Ambulatory Visit: Payer: Self-pay | Admitting: Family Medicine

## 2023-12-10 LAB — HM MAMMOGRAPHY

## 2023-12-12 ENCOUNTER — Other Ambulatory Visit: Payer: Self-pay

## 2023-12-12 NOTE — Patient Outreach (Signed)
 Complex Care Management   Visit Note  12/12/2023  Name:  Suzanne Hardin MRN: [id number] DOB: 1955-07-27  Situation: Referral received for Complex Care Management related to Diabetes with Complications and HLD, HTN I obtained verbal consent from Patient.  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  HEENT HEENT Symptoms Reported: Frequent sneezing, Sore throat (seasonal allergies)      Cardiovascular Cardiovascular Symptoms Reported: No symptoms reported Does patient have uncontrolled Hypertension?: No Is patient checking Blood Pressure at home?: Yes Patient's Recent BP reading at home: patient reports readings WNL Cardiovascular Conditions: High blood cholesterol Cardiovascular Management Strategies: Routine screening  Respiratory Respiratory Symptoms Reported: No symptoms reported Respiratory Conditions: Seasonal allergies  Endocrine Patient reports the following symptoms related to hypoglycemia or hyperglycemia : No symptoms reported Is patient diabetic?: Yes Is patient  checking blood sugars at home?: Yes Endocrine Conditions: Diabetes Endocrine Management Strategies: Medical device, Medication therapy, Routine screening, Diet modification  Gastrointestinal Gastrointestinal Symptoms Reported: No symptoms reported      Genitourinary Genitourinary Symptoms Reported: No symptoms reported    Integumentary Integumentary Symptoms Reported: No symptoms reported    Musculoskeletal Additional Musculoskeletal Details: chronic dystonia, next botox injection 12/30/23 Musculoskeletal Conditions: Other Musculoskeletal Management Strategies: Coping strategies, Medication therapy, Routine screening      Psychosocial Psychosocial Symptoms Reported: No symptoms reported     Do you feel physically threatened by others?: No      10/11/2023   10:10 AM  Depression screen PHQ 2/9  Decreased Interest 0  Down, Depressed, Hopeless 0  PHQ - 2 Score 0    There were no vitals filed for this visit.  Medications Reviewed Today     Reviewed by Clarnce Crow, RN (Registered Nurse) on 12/12/23 at 0915  Med List Status: <None>   Medication Order Taking? Sig Documenting Provider Last Dose Status Informant  Albuterol -Budesonide (AIRSUPRA ) 90-80 MCG/ACT AERO [id number] Yes INHALE 2 PUFFS INTO THE LUNGS 4 TIMES DAILY AS NEEDED. Mercy Stall, MD  Active   Alpha-Lipoic Acid 300 MG CAPS [id number] Yes Take 1 capsule by mouth daily. [provider]  Active            Med Note Laverna Pott, Sue Em   Thu Jun 06, 2023 10:39 AM) For neuropathy  baclofen (LIORESAL) 10 MG tablet [id number] Yes Take 10 mg by mouth 3 (three) times daily as needed. [provider]  Active   Blood Glucose Calibration (ONETOUCH VERIO) SOLN [id number] Yes 1 each by In Vitro route every 30 (thirty) days. Audie Bleacher, MD  Active   Blood Glucose Monitoring Suppl [facility] VERIO) w/Device Suzanne Erps [id number]  Yes 1 each by Does not apply route 3 (three) times daily. Audie Bleacher, MD  Active    chlorpheniramine (CHLOR-TRIMETON) 4 MG tablet [id number] Yes Take 1 mg by mouth every 6 (six) hours as needed for allergies. [provider]  Active   Cholecalciferol (VITAMIN D ) 125 MCG (5000 UT) CAPS [id number] Yes Take 5,000 Units by mouth 2 (two) times a week. [provider]  Active            Med Note Laverna Pott, Sue Em   Thu Jun 06, 2023 10:41 AM) Taking twice a week  CONTOUR NEXT TEST test strip [id number] Yes USE AS Henderson Lock, MD  Active   Cyanocobalamin 1000 MCG TBCR [id number] Yes Take 1 tablet by mouth every other day. [provider]  Active   D-Mannose POWD [id number] Yes Take by mouth as needed. [provider]  Active            Med Note Laverna Pott, April Bayard Jun 06, 2023 10:43 AM) Starts taking when infections recur.   hydrOXYzine (ATARAX) 10 MG/5ML syrup [id number] Yes Take by mouth. [provider]  Active            Med Note (KLINE, KEESHA J   Thu Mar 28, 2023 10:24 AM) Only as needed for food allergy reactions  ibuprofen (ADVIL) 200 MG tablet [id number] Yes Take 200 mg by mouth every 6 (six) hours as needed. [provider]  Active   Lancets MISC [id number] Yes by Does not apply route. [provider]  Active   NOVOLOG  FLEXPEN 100 UNIT/ML FlexPen [id number] Yes Patient uses slidding scale. Maximum 30 units uses only blood sugar >250mg /dl. Using 8-10 units when sugars are high lately. Audie Bleacher, MD  Active   pyridOXINE (VITAMIN B-6) 50 MG tablet [id number] Yes Take 50 mg by mouth every other day. Takes the p5pb6 version [provider]  Active            Med Note Laverna Pott, Sue Em   Thu Jun 06, 2023 10:45 AM) For fluid retention every day usually; daily if needed  TOUJEO  SOLOSTAR 300 UNIT/ML Solostar Pen [id number] Yes Inject 23 Units into the skin daily. Audie Bleacher, MD  Active            Med Note Burley Carpenter, Raenah Murley   Mon Nov 11, 2023  9:25 AM) Patient reports taking 18 units daily   TRIAMCINOLONE ACETONIDE, TOP, 0.05 % OINT [id number] Yes Apply topically daily as needed. Uses as needed for rash, bug bites or on gums before dentist. [provider]  Active   TRULICITY  1.5 MG/0.5ML [place] [id number] Yes INJECT 1.5 MG (0.5ML) UNDER THE SKIN TWICE A WEEK. MAX WEEKLY DOSE 3MG . Mercy Stall, MD  Active             Recommendation:   Continue Current Plan of Care  Follow Up Plan:   Telephone follow up appointment date/time:  01/10/24 at 9:00   Clarnce Crow BSN RN CCM [place]  [facility], [facility] RN Care Manager Direct Dial: [phone] Fax: [phone]

## 2023-12-12 NOTE — Patient Instructions (Signed)
 Visit Information  Thank you for taking time to visit with me today. Please don't hesitate to contact me if I can be of assistance to you before our next scheduled appointment.  Our next appointment is by telephone on 01/10/24 at 9:00 Please call the care guide team at [phone] if you need to cancel or reschedule your appointment.   Following is a copy of your care plan:   Goals Addressed             This Visit's Progress    VBCI RN Care Plan       Problems:  Chronic Disease Management support and education needs related to DMII, HLD, and HTN  Goal: Over the next 3months the Patient will attend all scheduled medical appointments: 01/08/24 PCP as evidenced by patient report and chart review        take all medications exactly as prescribed and will call provider for medication related questions as evidenced by patient report and chart review     Interventions:   Diabetes Interventions: Assessed patient's understanding of A1c goal: <6.5% Reviewed medications with patient and discussed importance of medication adherence Counseled on importance of regular laboratory monitoring as prescribed Discussed plans with patient for ongoing care management follow up and provided patient with direct contact information for care management team Reviewed scheduled/upcoming provider appointments including: 01/08/24 with PCP Review of patient status, including review of consultants reports, relevant laboratory and other test results, and medications completed Screening for signs and symptoms of depression related to chronic disease state  Assessed social determinant of health barriers Lab Results  Component Value Date   HGBA1C 7.3 (H) 10/08/2023    Hyperlipidemia Interventions: Medication review performed; medication list updated in electronic medical record.  Counseled on importance of regular laboratory monitoring as prescribed Screening for signs and symptoms of depression related to  chronic disease state Assessed social determinant of health barriers   Hypertension Interventions: Last practice recorded BP readings:  BP Readings from Last 3 Encounters:  11/11/23 117/70  10/11/23 120/68  10/08/23 122/86   Most recent eGFR/CrCl:  Lab Results  Component Value Date   EGFR 55 (L) 10/08/2023    No components found for: CRCL  Evaluation of current treatment plan related to hypertension self management and patient's adherence to plan as established by provider Discussed plans with patient for ongoing care management follow up and provided patient with direct contact information for care management team Advised patient, providing education and rationale, to monitor blood pressure daily and record, calling PCP for findings outside established parameters Screening for signs and symptoms of depression related to chronic disease state  Assessed social determinant of health barriers  Patient Self-Care Activities:  Attend all scheduled provider appointments Call pharmacy for medication refills 3-7 days in advance of running out of medications Take medications as prescribed    Plan:  Telephone follow up appointment with care management team member scheduled for:  01/10/24 at 9:00             Please call the Suicide and Crisis Lifeline: 988 call the [place]  National Suicide Prevention Lifeline: 5737219734 or TTY: (986) 640-8052 TTY 415-095-4074) to talk to a trained counselor call 1-800-273-TALK (toll free, 24 hour hotline) if you are experiencing a Mental Health or Behavioral Health Crisis or need someone to talk to.  Patient verbalizes understanding of instructions and care plan provided today and agrees to view in MyChart. Active MyChart status and patient understanding of how to access instructions and care plan  via MyChart confirmed with patient.      Clarnce Crow BSN RN CCM [place]  [facility], [facility] RN Care Manager Direct  Dial: [phone] Fax: [phone]

## 2023-12-19 NOTE — Progress Notes (Signed)
   12/19/2023  Patient ID: Suzanne Hardin, female   DOB: 31-Oct-1955, 68 y.o.   MRN: [id number]  Had set reminder to review patient for possible statin alternatives, as of April did not want to go on repatha, and wanted to wait for upcoming labs. Now has appt scheduled in July so I will defer outreach until next labs.   Primary prevention. Most recent 10-yr ASCVD places her at intermediate risk.  Future Appointments  Date Time Provider Department Center  01/08/2024  8:20 AM Sherre Clapper, MD [facility] None  01/10/2024  9:00 AM Lonzell Planas, RN [facility] None  02/14/2024  9:00 AM [facility], Clapper, MD [facility] None   Lang Sieve, PharmD, BCGP Clinical Pharmacist  [phone]

## 2024-01-08 ENCOUNTER — Ambulatory Visit: Admitting: Family Medicine

## 2024-01-08 ENCOUNTER — Encounter: Payer: Self-pay | Admitting: Family Medicine

## 2024-01-08 VITALS — BP 136/86 | HR 82 | Temp 98.3°F | Ht 66.0 in | Wt 195.0 lb

## 2024-01-08 DIAGNOSIS — E782 Mixed hyperlipidemia: Secondary | ICD-10-CM

## 2024-01-08 DIAGNOSIS — N3001 Acute cystitis with hematuria: Secondary | ICD-10-CM

## 2024-01-08 DIAGNOSIS — E1121 Type 2 diabetes mellitus with diabetic nephropathy: Secondary | ICD-10-CM | POA: Diagnosis not present

## 2024-01-08 DIAGNOSIS — I10 Essential (primary) hypertension: Secondary | ICD-10-CM | POA: Diagnosis not present

## 2024-01-08 DIAGNOSIS — J4540 Moderate persistent asthma, uncomplicated: Secondary | ICD-10-CM

## 2024-01-08 LAB — POCT URINALYSIS DIP (CLINITEK)
Bilirubin, UA: NEGATIVE
Glucose, UA: NEGATIVE mg/dL
Ketones, POC UA: NEGATIVE mg/dL
Leukocytes, UA: NEGATIVE
Nitrite, UA: POSITIVE — AB
Spec Grav, UA: 1.025 (ref 1.010–1.025)
Urobilinogen, UA: 0.2 U/dL
pH, UA: 6 (ref 5.0–8.0)

## 2024-01-08 MED ORDER — DOXYCYCLINE HYCLATE 100 MG PO TABS: 100.0000 mg | ORAL_TABLET | Freq: Two times a day (BID) | ORAL | 0 refills | Status: AC

## 2024-01-08 NOTE — Progress Notes (Signed)
 Subjective:  Patient ID: Suzanne Hardin, female    DOB: 30-Nov-1955  Age: 68 y.o. MRN: [id number]  Chief Complaint  Patient presents with   Medical Management of Chronic Issues   Discussed the use of AI scribe software for clinical note transcription with the patient, who gave verbal consent to proceed.  HPI: Suzanne Hardin is a 68 year old female with diabetes who presents with symptoms of a urinary tract infection.  Urinary tract symptoms - Dysuria and urinary symptoms consistent with urinary tract infection - Positive nitrates on home urine test strips - Unable to consistently detect odor in urine, but checks for odor when symptoms arise  Diabetes:  Complications: nephropathy Glucose checking: twice daily  Glucose logs: 81-190. Usually 100-125. Hypoglycemia: no Most recent A1C: 7.3% Current medications: Dulaglutide  1.5 mg two times per week ( taking every 6 days) Toujeo  16 units in the morning and 16 in the evening. Novolog  sliding scale 2-3 times per day. Rarely needs novolog . Last Eye Exam: 07/27/2023. Dr. Allison at [facility] in [place].  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  - Remains physically active, including painting her house and working in her yard - Expresses concern about potential drug interactions with Nexletol, particularly with Airsupra  and various antibiotics - Hesitant to initiate Nexletol therapy   Hypertension: Current medications: None    Cranial dystonia: Brad Boards, MD  Giving botox. Airsupra  has helped. On Baclofen as needed. Uses rarely.  Occurred after quadrivalent influenza vaccine given October 15th, 2014 at 8:30 am.    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 0    Patient Care Team: Sherre Clapper, MD as PCP - [facility]   Review of Systems  Constitutional:  Positive for fatigue. Negative for chills and fever.  HENT:  Negative for congestion, ear pain, rhinorrhea and sore throat.   Respiratory:  Negative for cough and shortness of breath.   Cardiovascular:  Negative for chest pain.  Gastrointestinal:  Negative for abdominal pain, constipation, diarrhea, nausea and vomiting.  Genitourinary:  Negative for dysuria and urgency.       Urine has odor.  Musculoskeletal:  Positive for back pain. Negative for myalgias.  Neurological:  Negative for dizziness, weakness, light-headedness and headaches.  Psychiatric/Behavioral:  Negative for dysphoric mood. The patient is not nervous/anxious.     Current Outpatient Medications on File Prior to Visit  Medication Sig Dispense Refill   Albuterol -Budesonide (AIRSUPRA ) 90-80 MCG/ACT AERO INHALE 2 PUFFS INTO THE LUNGS 4 TIMES DAILY AS NEEDED. 10.7 g 2   Alpha-Lipoic Acid 300 MG CAPS Take  1 capsule by mouth daily.     baclofen (LIORESAL) 10 MG tablet Take 10 mg by mouth 3 (three) times daily as needed.     Blood Glucose Calibration (ONETOUCH VERIO) SOLN 1 each by In Vitro route every 30 (thirty) days. 1 each 2   Blood Glucose Monitoring Suppl (ONETOUCH VERIO) w/Device KIT 1 each by Does not apply route 3 (three) times daily. 1 kit 0   chlorpheniramine (CHLOR-TRIMETON) 4 MG tablet Take 1 mg by mouth every 6 (six)  hours as needed for allergies.     Cholecalciferol (VITAMIN D ) 125 MCG (5000 UT) CAPS Take 5,000 Units by mouth 2 (two) times a week.     CONTOUR NEXT TEST test strip USE AS DIRECTED 300 strip 2   Cyanocobalamin 1000 MCG TBCR Take 1 tablet by mouth every other day.     D-Mannose POWD Take by mouth as needed.     hydrOXYzine (ATARAX) 10 MG/5ML syrup Take by mouth.     ibuprofen (ADVIL) 200 MG tablet Take 200 mg by mouth every 6 (six) hours as needed.     Lancets MISC by Does not apply route.     NOVOLOG  FLEXPEN 100 UNIT/ML FlexPen Patient uses slidding scale. Maximum 30 units uses only blood sugar >250mg /dl. Using 8-10 units when sugars are high lately. 15 mL 1   pyridOXINE (VITAMIN B-6) 50 MG tablet Take 50 mg by mouth every other day. Takes the p5pb6 version     TOUJEO  SOLOSTAR 300 UNIT/ML Solostar Pen Inject 23 Units into the skin daily. 6 pen 6   TRIAMCINOLONE ACETONIDE, TOP, 0.05 % OINT Apply topically daily as needed. Uses as needed for rash, bug bites or on gums before dentist.     TRULICITY  1.5 MG/0.5ML SOAJ INJECT 1.5 MG (0.5ML) UNDER THE SKIN TWICE A WEEK. MAX WEEKLY DOSE 3MG . 16 mL 0   No current facility-administered medications on file prior to visit.   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 Food, (peanuts, bell peppers, raw onions,pickles) Drug, and Skin, environmental, fragrances & air freshners   Type 2 diabetes mellitus with other specified complication (HCC) 09/16/2019   Past Surgical History:  Procedure Laterality Date   APPENDECTOMY   2000   CATARACT EXTRACTION, BILATERAL     CHOLECYSTECTOMY  2000   TONSILLECTOMY AND ADENOIDECTOMY      Family History  Problem Relation Age of Onset   Ulcers Father    Peripheral Artery Disease Father    Breast cancer Maternal Aunt    Breast cancer Maternal Aunt    Breast cancer Cousin    Social History   Socioeconomic History   Marital status: Divorced    Spouse name: Not on file   Number of children: 2   Years of education: Not on file   Highest education level: Not on file  Occupational History   Occupation: Disabled  Tobacco Use   Smoking status: Never   Smokeless tobacco: Never  Vaping Use   Vaping status: Never Used  Substance and Sexual Activity   Alcohol use:  Never   Drug use: Never   Sexual activity: Yes    Partners: Male  Other Topics Concern   Not on file  Social History Narrative   Not on file   Social Drivers of Health   Financial Resource Strain: Low Risk  (01/08/2024)   Overall Financial Resource Strain (CARDIA)    Difficulty of Paying Living Expenses: Not hard at all  Food Insecurity: No Food Insecurity (12/12/2023)   Hunger Vital Sign    Worried About Running Out of Food in the Last Year: Never true    Ran Out of Food in the Last Year: Never true  Transportation Needs: No Transportation Needs (12/12/2023)   PRAPARE - Administrator, Civil Service (Medical): No    Lack of Transportation (Non-Medical): No  Physical Activity: Sufficiently Active (01/08/2024)   Exercise Vital Sign    Days of Exercise per Week: 5 days    Minutes of Exercise per Session: 150+ min  Stress: No Stress Concern Present (01/08/2024)   [employer] of Occupational Health - Occupational Stress Questionnaire    Feeling of Stress: Not at all  Social Connections: Moderately Integrated (01/08/2024)   Social Connection and Isolation Panel    Frequency of Communication with Friends and Family: More than three times a week    Frequency of Social Gatherings with  Friends and Family: More than three times a week    Attends Religious Services: More than 4 times per year    Active Member of [employer] or Organizations: Yes    Attends Engineer, structural: More than 4 times per year    Marital Status: Divorced    Objective:  BP 136/86   Pulse 82   Temp 98.3 F (36.8 C)   Ht 5' 6 (1.676 m)   Wt 195 lb (88.5 kg)   SpO2 99%   BMI 31.47 kg/m      01/08/2024    9:15 AM 01/08/2024    8:24 AM 11/11/2023    9:20 AM  BP/Weight  Systolic BP 123 136 117  Diastolic BP 68 86 70  Wt. (Lbs)  195 189  BMI  31.47 kg/m2 29.82 kg/m2    Physical Exam Vitals reviewed.  Constitutional:      Appearance: Normal appearance. She is normal weight.  Neck:     Vascular: No carotid bruit.  Cardiovascular:     Rate and Rhythm: Normal rate and regular rhythm.     Pulses: Normal pulses.     Heart sounds: Normal heart sounds.  Pulmonary:     Effort: Pulmonary effort is normal. No respiratory distress.     Breath sounds: Normal breath sounds.  Abdominal:     General: Abdomen is flat. Bowel sounds are normal.     Palpations: Abdomen is soft.     Tenderness: There is no abdominal tenderness.  Neurological:     Mental Status: She is alert and oriented to person, place, and time.  Psychiatric:        Mood and Affect: Mood normal.        Behavior: Behavior normal.      Diabetic foot exam was performed with the following findings:   No deformities, ulcerations, or other skin breakdown Normal sensation of 10g monofilament Intact posterior tibialis and dorsalis pedis pulses      Lab Results  Component Value Date   WBC 7.7 01/08/2024   HGB 15.0 01/08/2024   HCT 45.5 01/08/2024   PLT 298 01/08/2024  GLUCOSE 163 (H) 01/08/2024   CHOL 210 (H) 01/08/2024   TRIG 78 01/08/2024   HDL 65 01/08/2024   LDLCALC 131 (H) 01/08/2024   ALT 19 01/08/2024   AST 25 01/08/2024   NA 142 01/08/2024   K 5.2 01/08/2024   CL 104 01/08/2024   CREATININE 1.02 (H)  01/08/2024   BUN 17 01/08/2024   CO2 21 01/08/2024   TSH 2.650 10/08/2023   HGBA1C 7.0 (H) 01/08/2024   MICROALBUR 80 12/28/2020      Assessment & Plan:  Acute cystitis with hematuria Assessment & Plan: Start doxycycline .  Urine sent for culture.   Orders: -     POCT URINALYSIS DIP (CLINITEK) -     Urine Culture -     Doxycycline  Hyclate; Take 1 tablet (100 mg total) by mouth 2 (two) times daily.  Dispense: 14 tablet; Refill: 0  Diabetic glomerulopathy (HCC) Assessment & Plan: Diabetes managed with Toujeo  and Trulicity .  - Continue to check sugars and feet daily..  - Continue Toujeo  and Trulicity  with current dosing schedule. - Use Novolog  as needed for blood sugar spikes.  Orders: -     Microalbumin / creatinine urine ratio -     Hemoglobin A1c  Essential hypertension Assessment & Plan: Controlled off medicines. Continue to work on eating a healthy diet and exercise.  Labs drawn today.    Orders: -     CBC with Differential/Platelet -     Comprehensive metabolic panel with GFR  Mixed hyperlipidemia Assessment & Plan: Not at goal. Intolerant to numerous medicines. Reluctant to take nexlitol. Continue to work on eating a healthy diet and exercise.  Labs drawn today.    Orders: -     Lipid panel  Moderate persistent asthma without complication Assessment & Plan: Continue airsupra .       Meds ordered this encounter  Medications   doxycycline  (VIBRA -TABS) 100 MG tablet    Sig: Take 1 tablet (100 mg total) by mouth 2 (two) times daily.    Dispense:  14 tablet    Refill:  0    Orders Placed This Encounter  Procedures   Urine Culture   Microalbumin / creatinine urine ratio   CBC with Differential/Platelet   Comprehensive metabolic panel with GFR   Lipid panel   Hemoglobin A1c   POCT URINALYSIS DIP (CLINITEK)     Follow-up: Return in about 3 months (around 04/09/2024) for chronic follow up.   I,Katherina A Bramblett,acting as a scribe for  Abigail Free, MD.,have documented all relevant documentation on the behalf of Abigail Free, MD,as directed by  Abigail Free, MD while in the presence of Abigail Free, MD.   An After Visit Summary was printed and given to the patient.  I attest that I have reviewed this visit and agree with the plan scribed by my staff.  Abigail Free, MD [facility] [phone]

## 2024-01-09 ENCOUNTER — Ambulatory Visit: Payer: Self-pay | Admitting: Family Medicine

## 2024-01-09 LAB — CBC WITH DIFFERENTIAL/PLATELET
Basophils Absolute: 0.1 x10E3/uL (ref 0.0–0.2)
Basos: 1 %
EOS (ABSOLUTE): 0.3 x10E3/uL (ref 0.0–0.4)
Eos: 4 %
Hematocrit: 45.5 % (ref 34.0–46.6)
Hemoglobin: 15 g/dL (ref 11.1–15.9)
Immature Grans (Abs): 0 x10E3/uL (ref 0.0–0.1)
Immature Granulocytes: 0 %
Lymphocytes Absolute: 2.7 x10E3/uL (ref 0.7–3.1)
Lymphs: 35 %
MCH: 30.1 pg (ref 26.6–33.0)
MCHC: 33 g/dL (ref 31.5–35.7)
MCV: 91 fL (ref 79–97)
Monocytes Absolute: 0.4 x10E3/uL (ref 0.1–0.9)
Monocytes: 5 %
Neutrophils Absolute: 4.2 x10E3/uL (ref 1.4–7.0)
Neutrophils: 54 %
Platelets: 298 x10E3/uL (ref 150–450)
RBC: 4.99 x10E6/uL (ref 3.77–5.28)
RDW: 13 % (ref 11.7–15.4)
WBC: 7.7 x10E3/uL (ref 3.4–10.8)

## 2024-01-09 LAB — COMPREHENSIVE METABOLIC PANEL WITH GFR
ALT: 19 IU/L (ref 0–32)
AST: 25 IU/L (ref 0–40)
Albumin: 4.5 g/dL (ref 3.9–4.9)
Alkaline Phosphatase: 100 IU/L (ref 44–121)
BUN/Creatinine Ratio: 17 (ref 12–28)
BUN: 17 mg/dL (ref 8–27)
Bilirubin Total: 0.5 mg/dL (ref 0.0–1.2)
CO2: 21 mmol/L (ref 20–29)
Calcium: 9.8 mg/dL (ref 8.7–10.3)
Chloride: 104 mmol/L (ref 96–106)
Creatinine, Ser: 1.02 mg/dL — ABNORMAL HIGH (ref 0.57–1.00)
Globulin, Total: 2.3 g/dL (ref 1.5–4.5)
Glucose: 163 mg/dL — ABNORMAL HIGH (ref 70–99)
Potassium: 5.2 mmol/L (ref 3.5–5.2)
Sodium: 142 mmol/L (ref 134–144)
Total Protein: 6.8 g/dL (ref 6.0–8.5)
eGFR: 61 mL/min/1.73 (ref >59)

## 2024-01-09 LAB — LIPID PANEL
Chol/HDL Ratio: 3.2 ratio (ref 0.0–4.4)
Cholesterol, Total: 210 mg/dL — ABNORMAL HIGH (ref 100–199)
HDL: 65 mg/dL (ref >39)
LDL Chol Calc (NIH): 131 mg/dL — ABNORMAL HIGH (ref 0–99)
Triglycerides: 78 mg/dL (ref 0–149)
VLDL Cholesterol Cal: 14 mg/dL (ref 5–40)

## 2024-01-09 LAB — MICROALBUMIN / CREATININE URINE RATIO
Creatinine, Urine: 161.3 mg/dL
Microalb/Creat Ratio: 54 mg/g{creat} — ABNORMAL HIGH (ref 0–29)
Microalbumin, Urine: 86.9 ug/mL

## 2024-01-09 LAB — HEMOGLOBIN A1C
Est. average glucose Bld gHb Est-mCnc: 154 mg/dL
Hgb A1c MFr Bld: 7 % — ABNORMAL HIGH (ref 4.8–5.6)

## 2024-01-10 ENCOUNTER — Other Ambulatory Visit: Payer: Self-pay

## 2024-01-10 NOTE — Patient Outreach (Signed)
 Complex Care Management   Visit Note  01/10/2024  Name:  Suzanne Hardin MRN: [id number] DOB: 06/21/1956  Situation: Referral received for Complex Care Management related to Diabetes with Complications I obtained verbal consent from Patient.  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 Food, (peanuts, bell peppers, raw onions,pickles) Drug, and Skin, environmental, fragrances & air freshners   Type 2 diabetes mellitus with other specified complication (HCC) 09/16/2019    Assessment: Patient Reported Symptoms:  Cognitive Cognitive Status: No symptoms reported      Neurological Neurological Review of Symptoms: No symptoms reported    HEENT        Cardiovascular Cardiovascular Symptoms Reported: No symptoms reported Does patient have uncontrolled Hypertension?: No Cardiovascular Management Strategies: Routine screening  Respiratory Respiratory Symptoms Reported: No symptoms reported    Endocrine Endocrine Symptoms Reported: No symptoms reported Is patient diabetic?: Yes Is patient checking blood sugars at home?: Yes List most recent blood sugar readings, include date and time of day: fasting today 149    Gastrointestinal Gastrointestinal Symptoms Reported: No symptoms reported      Genitourinary Genitourinary Symptoms Reported: No symptoms reported Additional Genitourinary Details: recently dx with UTI 01/08/24 taking abx, symptoms are improving    Integumentary Integumentary Symptoms  Reported: No symptoms reported    Musculoskeletal     Falls in the past year?: No    Psychosocial              10/11/2023   10:10 AM  Depression screen PHQ 2/9  Decreased Interest 0  Down, Depressed, Hopeless 0  PHQ - 2 Score 0    Vitals:   01/08/24 0915  BP: 123/68    Medications Reviewed Today     Reviewed by Lonzell Planas, RN (Registered Nurse) on 01/10/24 at 0912  Med List Status: <None>   Medication Order Taking? Sig Documenting Provider Last Dose Status Informant  Albuterol -Budesonide (AIRSUPRA ) 90-80 MCG/ACT AERO [id number]  INHALE 2 PUFFS INTO THE LUNGS 4 TIMES DAILY AS NEEDED. Sherre Clapper, MD  Active   Alpha-Lipoic Acid 300 MG CAPS [id number]  Take 1 capsule by mouth daily. [provider]  Active            Med Note ELENA, ALOYSIUS HERO   Thu Jun 06, 2023 10:39 AM) For neuropathy  baclofen (LIORESAL) 10 MG tablet [id number]  Take 10 mg by mouth 3 (three) times daily as needed. [provider]  Active   Blood Glucose Calibration (ONETOUCH VERIO) SOLN [id number]  1 each by In Vitro route every 30 (thirty) days. Abran Jerilynn Loving, MD  Active   Blood Glucose Monitoring Suppl [facility] VERIO) w/Device PRESSLEY [id number]  1 each by Does not apply route 3 (three) times daily. Abran Jerilynn Loving, MD  Active   chlorpheniramine (CHLOR-TRIMETON) 4 MG tablet [id number]  Take 1 mg by mouth every 6 (six) hours as needed for allergies. [provider]  Active   Cholecalciferol (VITAMIN D ) 125 MCG (5000 UT) CAPS [id number]  Take 5,000 Units by mouth 2 (two) times a week. [provider]  Active            Med Note ELENA, ALOYSIUS HERO   Thu Jun 06, 2023 10:41 AM) Taking twice a week  CONTOUR NEXT TEST test strip [id number]  USE AS JONELLE Sherre Clapper, MD  Active   Cyanocobalamin 1000 MCG TBCR [id number]  Take 1 tablet by mouth every other day. [provider]  Active   D-Mannose POWD [id number]  Take by mouth as needed. [provider]   Active            Med Note ELENA, ALOYSIUS HERO Schaumann Jun 06, 2023 10:43 AM) Starts taking when infections recur.   doxycycline  (VIBRA -TABS) 100 MG tablet [id number]  Take 1 tablet (100 mg total) by mouth 2 (two) times daily. Cox, Kirsten, MD  Active   hydrOXYzine (ATARAX) 10 MG/5ML syrup [id number]  Take by mouth. [provider]  Active            Med Note LAURELYN CARA JINNY Schaumann Mar 28, 2023 10:24 AM) Only as needed for food allergy reactions  ibuprofen (ADVIL) 200 MG tablet [id number]  Take 200 mg by mouth every 6 (six) hours as needed. [provider]  Active   Lancets MISC [id number]  by Does not apply route. [provider]  Active   NOVOLOG  FLEXPEN 100 UNIT/ML FlexPen [id number]  Patient uses slidding scale. Maximum 30 units uses only blood sugar >250mg /dl. Using 8-10 units when sugars are high lately. Abran Jerilynn Loving, MD  Active   pyridOXINE (VITAMIN B-6) 50 MG tablet [id number]  Take 50 mg by mouth every other day. Takes the p5pb6 version [provider]  Active            Med Note ELENA, ALOYSIUS HERO   Thu Jun 06, 2023 10:45 AM) For fluid retention every day usually; daily if needed  TOUJEO  SOLOSTAR 300 UNIT/ML Solostar Pen [id number]  Inject 23 Units into the skin daily. Abran Jerilynn Loving, MD  Active            Med Note DANICE, Layney Gillson   Mon Nov 11, 2023  9:25 AM) Patient reports taking 18 units daily  TRIAMCINOLONE ACETONIDE, TOP, 0.05 % OINT [id number]  Apply topically daily as needed. Uses as needed for rash, bug bites or on gums before dentist. [provider]  Active   TRULICITY  1.5 MG/0.5ML SOAJ [id number]  INJECT 1.5 MG (0.5ML) UNDER THE SKIN TWICE A WEEK. MAX WEEKLY DOSE 3MG . Cox, Kirsten, MD  Active             Recommendation:   Continue Current Plan of Care  Follow Up Plan:   Patient has met all care management goals. Care Management case will be closed. Patient has been provided contact information should new needs arise.    SIG  Olam Idol BSN RN CCM [place]  [facility], [facility] RN Care Manager Direct Dial: [phone] Fax: [phone]

## 2024-01-10 NOTE — Patient Instructions (Signed)
 Visit Information  Thank you for taking time to visit with me today. Please don't hesitate to contact me if I can be of assistance to you before our next scheduled appointment.  Our next appointment is by telephone no further scheduled appointments.  on  at  Please call the care guide team at [phone] if you need to cancel or reschedule your appointment.   Following is a copy of your care plan:   Goals Addressed             This Visit's Progress    COMPLETED: [facility] RN Care Plan   On track    Problems:  Chronic Disease Management support and education needs related to DMII, HLD, and HTN  Goal: Over the next 3months the Patient will attend all scheduled medical appointments: 01/08/24 PCP as evidenced by patient report and chart review        take all medications exactly as prescribed and will call provider for medication related questions as evidenced by patient report and chart review     Interventions:   Diabetes Interventions: Assessed patient's understanding of A1c goal: <6.5% Reviewed medications with patient and discussed importance of medication adherence Counseled on importance of regular laboratory monitoring as prescribed Discussed plans with patient for ongoing care management follow up and provided patient with direct contact information for care management team Reviewed scheduled/upcoming provider appointments including: 01/08/24 with PCP Review of patient status, including review of consultants reports, relevant laboratory and other test results, and medications completed Screening for signs and symptoms of depression related to chronic disease state  Assessed social determinant of health barriers Lab Results  Component Value Date   HGBA1C 7.3 (H) 10/08/2023    Hyperlipidemia Interventions: Medication review performed; medication list updated in electronic medical record.  Counseled on importance of regular laboratory monitoring as prescribed Screening for  signs and symptoms of depression related to chronic disease state Assessed social determinant of health barriers   Hypertension Interventions: Last practice recorded BP readings:  BP Readings from Last 3 Encounters:  11/11/23 117/70  10/11/23 120/68  10/08/23 122/86   Most recent eGFR/CrCl:  Lab Results  Component Value Date   EGFR 55 (L) 10/08/2023    No components found for: CRCL  Evaluation of current treatment plan related to hypertension self management and patient's adherence to plan as established by provider Discussed plans with patient for ongoing care management follow up and provided patient with direct contact information for care management team Advised patient, providing education and rationale, to monitor blood pressure daily and record, calling PCP for findings outside established parameters Screening for signs and symptoms of depression related to chronic disease state  Assessed social determinant of health barriers  Patient Self-Care Activities:  Attend all scheduled provider appointments Call pharmacy for medication refills 3-7 days in advance of running out of medications Take medications as prescribed    Plan:  Telephone follow up appointment with care management team member scheduled for:  01/10/24 at 9:00             Please call the Suicide and Crisis Lifeline: 988 call the [place]  National Suicide Prevention Lifeline: 606-503-2870 or TTY: 661-054-4296 TTY 2348774324) to talk to a trained counselor call 1-800-273-TALK (toll free, 24 hour hotline) if you are experiencing a Mental Health or Behavioral Health Crisis or need someone to talk to.  Patient verbalizes understanding of instructions and care plan provided today and agrees to view in MyChart. Active MyChart status and patient understanding of  how to access instructions and care plan via MyChart confirmed with patient.     SIGNATURE  Olam Idol BSN RN CCM [place]  [facility], [facility] RN Care Manager Direct Dial: [phone] Fax: [phone]

## 2024-01-11 LAB — URINE CULTURE

## 2024-01-11 NOTE — Assessment & Plan Note (Signed)
 Not at goal. Intolerant to numerous medicines. Reluctant to take nexlitol. Continue to work on eating a healthy diet and exercise.  Labs drawn today.

## 2024-01-11 NOTE — Assessment & Plan Note (Signed)
Controlled off medicines.  Continue to work on eating a healthy diet and exercise.  Labs drawn today.  

## 2024-01-11 NOTE — Assessment & Plan Note (Signed)
 Continue airsupra .

## 2024-01-11 NOTE — Assessment & Plan Note (Signed)
 Start doxycycline .  Urine sent for culture.

## 2024-01-11 NOTE — Assessment & Plan Note (Signed)
 Diabetes managed with Toujeo  and Trulicity .  - Continue to check sugars and feet daily..  - Continue Toujeo  and Trulicity  with current dosing schedule. - Use Novolog  as needed for blood sugar spikes.

## 2024-01-13 ENCOUNTER — Other Ambulatory Visit: Payer: Self-pay | Admitting: Family Medicine

## 2024-01-13 MED ORDER — CIPROFLOXACIN HCL 250 MG PO TABS
250.0000 mg | ORAL_TABLET | Freq: Two times a day (BID) | ORAL | 0 refills | Status: AC
Start: 2024-01-13 — End: 2024-01-20

## 2024-02-09 ENCOUNTER — Other Ambulatory Visit: Payer: Self-pay | Admitting: Family Medicine

## 2024-02-09 DIAGNOSIS — E1169 Type 2 diabetes mellitus with other specified complication: Secondary | ICD-10-CM

## 2024-02-13 NOTE — Telephone Encounter (Unsigned)
 Copied from CRM [id number]. Topic: Clinical - Medical Advice >> Feb 13, 2024 11:58 AM Tiffini S wrote: Reason for CRM: Dephany with [facility] needs a reshippment for   TRULICITY  1.5 MG/0.5ML SOAJ- last shipment was 11/11/23 for auto refills:   [facility] [place], [place] - [address] [place] [address] Phone: [phone] Fax: [phone]

## 2024-02-14 ENCOUNTER — Encounter: Payer: Medicare Other | Admitting: Family Medicine

## 2024-02-20 ENCOUNTER — Telehealth: Payer: Self-pay

## 2024-02-20 NOTE — Telephone Encounter (Signed)
 Called patient and let her know we have her Toujeo  (2 boxes) ready for pick up. Patient stated she will come by in the morning.

## 2024-02-21 NOTE — Telephone Encounter (Signed)
 Patient picked up patient assistance

## 2024-02-26 ENCOUNTER — Telehealth: Payer: Self-pay

## 2024-02-26 NOTE — Telephone Encounter (Signed)
 Patient picked up patient assistance

## 2024-02-26 NOTE — Telephone Encounter (Signed)
 Notified patient that her Trulicity  patient assistance has arrived.

## 2024-02-28 ENCOUNTER — Telehealth: Payer: Self-pay

## 2024-02-28 NOTE — Telephone Encounter (Signed)
 Patient notified of patient assist ready for pick-up.

## 2024-03-02 NOTE — Telephone Encounter (Signed)
 Patient picked up patient assistance

## 2024-03-04 ENCOUNTER — Telehealth: Payer: Self-pay

## 2024-03-04 NOTE — Telephone Encounter (Signed)
 Patient picked up patient assistance

## 2024-03-04 NOTE — Telephone Encounter (Signed)
 Called and informed patient that we have received her patient assistance for Airsupra  90-80 mcg inhaler (10.7gm) . Told patient it was ready for pick up.  Received Airsupra  90-80 mcg inhaler - #3  Lots # [id number] NDC# [address]-87  Exp: 03/2026

## 2024-04-21 ENCOUNTER — Ambulatory Visit: Admitting: Family Medicine

## 2024-05-05 ENCOUNTER — Other Ambulatory Visit (HOSPITAL_COMMUNITY): Payer: Self-pay

## 2024-05-11 ENCOUNTER — Ambulatory Visit (INDEPENDENT_AMBULATORY_CARE_PROVIDER_SITE_OTHER): Admitting: Family Medicine

## 2024-05-11 ENCOUNTER — Encounter: Payer: Self-pay | Admitting: Family Medicine

## 2024-05-11 VITALS — BP 137/78 | HR 81 | Temp 97.8°F | Ht 66.0 in | Wt 191.0 lb

## 2024-05-11 DIAGNOSIS — N3000 Acute cystitis without hematuria: Secondary | ICD-10-CM

## 2024-05-11 DIAGNOSIS — Z1211 Encounter for screening for malignant neoplasm of colon: Secondary | ICD-10-CM

## 2024-05-11 DIAGNOSIS — E1121 Type 2 diabetes mellitus with diabetic nephropathy: Secondary | ICD-10-CM

## 2024-05-11 MED ORDER — CIPROFLOXACIN HCL 250 MG PO TABS: 250.0000 mg | ORAL_TABLET | Freq: Two times a day (BID) | ORAL | 0 refills | Status: AC

## 2024-05-11 NOTE — Assessment & Plan Note (Signed)
 Blood glucose levels well-controlled. Upcoming diabetic checkup scheduled for December 2nd. Requires assistance for medication renewal, specifically Toujeo  and Trulicity . - Ensure diabetic checkup on December 2nd. - Coordinate with patient assistance program for medication renewal.

## 2024-05-11 NOTE — Progress Notes (Signed)
 Acute Office Visit  Subjective:    Patient ID: Suzanne Hardin, female    DOB: 07/11/1955, 68 y.o.   MRN: [id number]  Chief Complaint  Patient presents with   Urinary Tract Infection    Discussed the use of AI scribe software for clinical note transcription with the patient, who gave verbal consent to proceed.  History of Present Illness Suzanne Hardin is a 68 year old female with a history of urinary tract infections who presents with symptoms of a urinary tract infection.  Urinary symptoms - Foul odor in urine since the weekend - Tenderness during urination since the weekend - Symptoms typically occur over weekends - No fever, chills, or sweats - Previous treatment with ciprofloxacin  250 mg twice daily was effective - Allergy to Pyridium - Allergy to ingredients in ciprofloxacin  except Auro brand  General well-being - Generally feels 'real good'  Metabolic control - Blood sugar well-controlled - Blood pressure well-controlled - Recent weight loss  Medication tolerance and supply - Will need Toujeo  and Trulicity  for the upcoming year on PAP    UA DIP BROUGHT FROM HOME.     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 Food, (peanuts, bell peppers, raw onions,pickles) Drug, and Skin, environmental, fragrances & air freshners   Type 2 diabetes mellitus with other specified complication (HCC) 09/16/2019    Past Surgical History:  Procedure Laterality Date   APPENDECTOMY  2000   CATARACT EXTRACTION, BILATERAL      CHOLECYSTECTOMY  2000   TONSILLECTOMY AND ADENOIDECTOMY      Family History  Problem Relation Age of Onset   Ulcers Father    Peripheral Artery Disease Father    Breast cancer Maternal Aunt    Breast cancer Maternal Aunt    Breast cancer Cousin     Social History   Socioeconomic History   Marital status: Divorced    Spouse name: Not on file   Number of children: 2   Years of education: Not on file   Highest education level: Not on file  Occupational History   Occupation: Disabled  Tobacco Use   Smoking status: Never   Smokeless tobacco: Never  Vaping Use   Vaping status: Never Used  Substance and Sexual Activity   Alcohol use: Never   Drug use: Never   Sexual activity: Yes    Partners: Male  Other Topics Concern   Not on file  Social History Narrative   Not on file   Social Drivers of Health   Financial Resource Strain: Low Risk  (01/08/2024)   Overall Financial Resource Strain (CARDIA)    Difficulty of Paying Living Expenses: Not hard at all  Food Insecurity: No Food Insecurity (12/12/2023)   Hunger Vital Sign    Worried About Running Out of Food in the Last Year: Never true    Ran Out of Food in the Last Year: Never true  Transportation Needs: No Transportation Needs (12/12/2023)   PRAPARE - Transportation    Lack of Transportation (Medical): No    Lack of  Transportation (Non-Medical): No  Physical Activity: Sufficiently Active (01/08/2024)   Exercise Vital Sign    Days of Exercise per Week: 5 days    Minutes of Exercise per Session: 150+ min  Stress: No Stress Concern Present (01/08/2024)   [employer] of Occupational Health - Occupational Stress Questionnaire    Feeling of Stress: Not at all  Social Connections: Moderately Integrated (01/08/2024)   Social Connection and Isolation Panel    Frequency of Communication with Friends and Family: More than three times a week    Frequency of Social Gatherings with Friends and Family: More than three times a  week    Attends Religious Services: More than 4 times per year    Active Member of [employer] or Organizations: Yes    Attends Engineer, Structural: More than 4 times per year    Marital Status: Divorced  Intimate Partner Violence: Not At Risk (12/12/2023)   Humiliation, Afraid, Rape, and Kick questionnaire    Fear of Current or Ex-Partner: No    Emotionally Abused: No    Physically Abused: No    Sexually Abused: No    Outpatient Medications Prior to Visit  Medication Sig Dispense Refill   Albuterol -Budesonide (AIRSUPRA ) 90-80 MCG/ACT AERO INHALE 2 PUFFS INTO THE LUNGS 4 TIMES DAILY AS NEEDED. 10.7 g 2   Alpha-Lipoic Acid 300 MG CAPS Take 1 capsule by mouth daily.     baclofen (LIORESAL) 10 MG tablet Take 10 mg by mouth 3 (three) times daily as needed.     Blood Glucose Calibration (ONETOUCH VERIO) SOLN 1 each by In Vitro route every 30 (thirty) days. 1 each 2   Blood Glucose Monitoring Suppl (ONETOUCH VERIO) w/Device KIT 1 each by Does not apply route 3 (three) times daily. 1 kit 0   chlorpheniramine (CHLOR-TRIMETON) 4 MG tablet Take 1 mg by mouth every 6 (six) hours as needed for allergies.     Cholecalciferol (VITAMIN D ) 125 MCG (5000 UT) CAPS Take 5,000 Units by mouth 2 (two) times a week.     CONTOUR NEXT TEST test strip USE AS DIRECTED 300 strip 2   Cyanocobalamin 1000 MCG TBCR Take 1 tablet by mouth every other day.     D-Mannose POWD Take by mouth as needed.     doxycycline  (VIBRA -TABS) 100 MG tablet Take 1 tablet (100 mg total) by mouth 2 (two) times daily. 14 tablet 0   hydrOXYzine (ATARAX) 10 MG/5ML syrup Take by mouth.     ibuprofen (ADVIL) 200 MG tablet Take 200 mg by mouth every 6 (six) hours as needed.     Lancets MISC by Does not apply route.     NOVOLOG  FLEXPEN 100 UNIT/ML FlexPen Patient uses slidding scale. Maximum 30 units uses only blood sugar >250mg /dl. Using 8-10 units when sugars are high lately. 15 mL 1   pyridOXINE (VITAMIN B-6) 50 MG tablet Take 50 mg by  mouth every other day. Takes the p5pb6 version     TOUJEO  SOLOSTAR 300 UNIT/ML Solostar Pen Inject 23 Units into the skin daily. 6 pen 6   TRIAMCINOLONE ACETONIDE, TOP, 0.05 % OINT Apply topically daily as needed. Uses as needed for rash, bug bites or on gums before dentist.     TRULICITY  1.5 MG/0.5ML SOAJ INJECT 1.5 MG (0.5ML) UNDER THE SKIN TWICE A WEEK. MAX WEEKLY DOSE 3MG . 16 mL 0   No facility-administered medications prior to visit.    Allergies  Allergen Reactions   Dextromethorphan-Guaifenesin Itching   Dorzolamide Hcl-Timolol  Mal Nausea And Vomiting    Brachycardia, severe headache, swelling Other reaction(s): Other (See Comments) Brachycardia, severe headache, swelling Brachycardia, severe headache, swelling   Hydrocodone Other (See Comments)    Inflammatory Inflammatory Other reaction(s): Other (See Comments) Inflammatory Inflammatory    Lidocaine Swelling   Other Rash, Other (See Comments) and Swelling    Neurological damage fragances fragances  Neurological damage Fragances; YELLOW NUMBER 5;  Yellow #5 Yellow #5 Yellow #5 Yellow #5  High eye pressure High eye pressure High eye pressure    Sodium Laureth Sulfate Dermatitis, Hives, Itching, Other (See Comments), Rash and Swelling   Sulfa Antibiotics Hives   Sulfites Shortness Of Breath    Other reaction(s): Shortness of Breath Other reaction(s): Shortness of Breath    Statins Hives and Other (See Comments)   Articaine-Epinephrine Swelling   Cymbalta [Duloxetine Hcl]    Darvon [Propoxyphene]    Duloxetine    Ezetimibe    Gentamicin Sulfate    Hctz [Hydrochlorothiazide]    Humalog [Insulin Lispro]    Keflex [Cephalexin]    Lasix [Furosemide]    Lisinopril-Hydrochlorothiazide    Loratadine    Losartan    Macrodantin [Nitrofurantoin]    Metformin    Mucinex [Guaifenesin Er]    Peanut-Containing Drug Products    Penicillins    Pioglitazone    Tessalon Perles [Benzonatate]    Travatan  [Travoprost]    Tylenol [Acetaminophen]    Propofol Other (See Comments) and Nausea And Vomiting   Sulfamethoxazole-Trimethoprim Rash    Review of Systems  Constitutional:  Negative for appetite change, fatigue and fever.  HENT:  Negative for congestion, ear pain, sinus pressure and sore throat.   Respiratory:  Negative for cough, chest tightness, shortness of breath and wheezing.   Cardiovascular:  Negative for chest pain and palpitations.  Gastrointestinal:  Negative for abdominal pain, constipation, diarrhea, nausea and vomiting.  Genitourinary:  Positive for dysuria and frequency. Negative for hematuria.  Musculoskeletal:  Negative for arthralgias, back pain, joint swelling and myalgias.  Skin:  Negative for rash.  Neurological:  Negative for dizziness, weakness and headaches.  Psychiatric/Behavioral:  Negative for dysphoric mood. The patient is not nervous/anxious.        Objective:        05/11/2024    9:50 AM 01/08/2024    9:15 AM 01/08/2024    8:24 AM  Vitals with BMI  Height 5' 6  5' 6  Weight 191 lbs  195 lbs  BMI 30.84  31.49  Systolic 137 123 863  Diastolic 78 68 86  Pulse 81  82    Orthostatic VS for the past 72 hrs (Last 3 readings):  Patient Position BP Location  05/11/24 0950 Sitting Right Arm     Physical Exam Vitals reviewed.  Constitutional:      Appearance: Normal appearance. She is normal weight.  Cardiovascular:     Rate and Rhythm: Normal rate and regular rhythm.     Heart sounds: Normal heart sounds.  Pulmonary:     Effort: Pulmonary effort is normal. No respiratory distress.     Breath sounds: Normal breath sounds.  Abdominal:     General: Abdomen is flat. Bowel sounds are normal.     Palpations: Abdomen is soft.     Tenderness: There is no abdominal tenderness (suprapubic).  Neurological:     Mental Status: She is alert and oriented to person, place, and time.  Psychiatric:        Mood and Affect: Mood  normal.        Behavior:  Behavior normal.     Health Maintenance Due  Topic Date Due   OPHTHALMOLOGY EXAM  08/03/2023   Fecal DNA (Cologuard)  01/31/2024   Medicare Annual Wellness (AWV)  02/12/2024    There are no preventive care reminders to display for this patient.   Lab Results  Component Value Date   TSH 2.650 10/08/2023   Lab Results  Component Value Date   WBC 7.7 01/08/2024   HGB 15.0 01/08/2024   HCT 45.5 01/08/2024   MCV 91 01/08/2024   PLT 298 01/08/2024   Lab Results  Component Value Date   NA 142 01/08/2024   K 5.2 01/08/2024   CO2 21 01/08/2024   GLUCOSE 163 (H) 01/08/2024   BUN 17 01/08/2024   CREATININE 1.02 (H) 01/08/2024   BILITOT 0.5 01/08/2024   ALKPHOS 100 01/08/2024   AST 25 01/08/2024   ALT 19 01/08/2024   PROT 6.8 01/08/2024   ALBUMIN 4.5 01/08/2024   CALCIUM 9.8 01/08/2024   EGFR 61 01/08/2024   Lab Results  Component Value Date   CHOL 210 (H) 01/08/2024   Lab Results  Component Value Date   HDL 65 01/08/2024   Lab Results  Component Value Date   LDLCALC 131 (H) 01/08/2024   Lab Results  Component Value Date   TRIG 78 01/08/2024   Lab Results  Component Value Date   CHOLHDL 3.2 01/08/2024   Lab Results  Component Value Date   HGBA1C 7.0 (H) 01/08/2024        Results for orders placed or performed in visit on 01/08/24  POCT URINALYSIS DIP (CLINITEK)   Collection Time: 01/08/24  8:41 AM  Result Value Ref Range   Color, UA brown (A) yellow   Clarity, UA hazy (A) clear   Glucose, UA negative negative mg/dL   Bilirubin, UA negative negative   Ketones, POC UA negative negative mg/dL   Spec Grav, UA 8.974 8.989 - 1.025   Blood, UA trace-intact (A) negative   pH, UA 6.0 5.0 - 8.0   POC PROTEIN,UA trace negative, trace   Urobilinogen, UA 0.2 0.2 or 1.0 E.U./dL   Nitrite, UA Positive (A) Negative   Leukocytes, UA Negative Negative  Urine Culture   Collection Time: 01/08/24  8:46 AM   Specimen: Urine   UR  Result Value Ref Range   Urine  Culture, Routine Final report (A)    Organism ID, Bacteria Escherichia coli (A)    Antimicrobial Susceptibility Comment   Microalbumin / creatinine urine ratio   Collection Time: 01/08/24  9:30 AM  Result Value Ref Range   Creatinine, Urine 161.3 Not Estab. mg/dL   Microalbumin, Urine 13.0 Not Estab. ug/mL   Microalb/Creat Ratio 54 (H) 0 - 29 mg/g creat  CBC with Differential/Platelet   Collection Time: 01/08/24  9:30 AM  Result Value Ref Range   WBC 7.7 3.4 - 10.8 x10E3/uL   RBC 4.99 3.77 - 5.28 x10E6/uL   Hemoglobin 15.0 11.1 - 15.9 g/dL   Hematocrit 54.4 65.9 - 46.6 %   MCV 91 79 - 97 fL   MCH 30.1 26.6 - 33.0 pg   MCHC 33.0 31.5 - 35.7 g/dL   RDW 86.9 88.2 - 84.5 %   Platelets 298 150 - 450 x10E3/uL   Neutrophils 54 Not Estab. %   Lymphs 35 Not Estab. %   Monocytes 5 Not Estab. %   Eos 4 Not Estab. %  Basos 1 Not Estab. %   Neutrophils Absolute 4.2 1.4 - 7.0 x10E3/uL   Lymphocytes Absolute 2.7 0.7 - 3.1 x10E3/uL   Monocytes Absolute 0.4 0.1 - 0.9 x10E3/uL   EOS (ABSOLUTE) 0.3 0.0 - 0.4 x10E3/uL   Basophils Absolute 0.1 0.0 - 0.2 x10E3/uL   Immature Granulocytes 0 Not Estab. %   Immature Grans (Abs) 0.0 0.0 - 0.1 x10E3/uL  Comprehensive metabolic panel with GFR   Collection Time: 01/08/24  9:30 AM  Result Value Ref Range   Glucose 163 (H) 70 - 99 mg/dL   BUN 17 8 - 27 mg/dL   Creatinine, Ser 8.97 (H) 0.57 - 1.00 mg/dL   eGFR 61 >40 fO/fpw/8.26   BUN/Creatinine Ratio 17 12 - 28   Sodium 142 134 - 144 mmol/L   Potassium 5.2 3.5 - 5.2 mmol/L   Chloride 104 96 - 106 mmol/L   CO2 21 20 - 29 mmol/L   Calcium 9.8 8.7 - 10.3 mg/dL   Total Protein 6.8 6.0 - 8.5 g/dL   Albumin 4.5 3.9 - 4.9 g/dL   Globulin, Total 2.3 1.5 - 4.5 g/dL   Bilirubin Total 0.5 0.0 - 1.2 mg/dL   Alkaline Phosphatase 100 44 - 121 IU/L   AST 25 0 - 40 IU/L   ALT 19 0 - 32 IU/L  Lipid panel   Collection Time: 01/08/24  9:30 AM  Result Value Ref Range   Cholesterol, Total 210 (H) 100 - 199 mg/dL    Triglycerides 78 0 - 149 mg/dL   HDL 65 >60 mg/dL   VLDL Cholesterol Cal 14 5 - 40 mg/dL   LDL Chol Calc (NIH) 868 (H) 0 - 99 mg/dL   Chol/HDL Ratio 3.2 0.0 - 4.4 ratio  Hemoglobin A1c   Collection Time: 01/08/24  9:30 AM  Result Value Ref Range   Hgb A1c MFr Bld 7.0 (H) 4.8 - 5.6 %   Est. average glucose Bld gHb Est-mCnc 154 mg/dL     Assessment & Plan:   Assessment & Plan Acute cystitis without hematuria Symptoms of malodorous urine and dysuria for two days. No fever, chills, or back pain. Previous treatment with Cipro  250 mg twice daily was effective. - Prescribed Cipro  250 mg twice daily. Orders:   Urine Culture  Screen for colon cancer Due for Cologuard test. Allergic to pretreatments for other screening methods. - Ordered Cologuard test.    Diabetic glomerulopathy (HCC) Blood glucose levels well-controlled. Upcoming diabetic checkup scheduled for December 2nd. Requires assistance for medication renewal, specifically Toujeo  and Trulicity . - Ensure diabetic checkup on December 2nd. - Coordinate with patient assistance program for medication renewal.     Meds ordered this encounter  Medications   ciprofloxacin  (CIPRO ) 250 MG tablet    Sig: Take 1 tablet (250 mg total) by mouth 2 (two) times daily for 7 days.    Dispense:  14 tablet    Refill:  0    AURO NEEDED DUE TO ALLERGIES TO INGREDIENTS IN OTHER BRANDS.    Orders Placed This Encounter  Procedures   Urine Culture     Follow-up: Return in about 15 days (around 05/26/2024) for chronic follow up (ALREADY SCHEDULED).  An After Visit Summary was printed and given to the patient.    I,Lauren M Auman,acting as a scribe for Abigail Free, MD.,have documented all relevant documentation on the behalf of Abigail Free, MD,as directed by  Abigail Free, MD while in the presence of Abigail Free, MD.    Abigail  Chantel Teti, MD [facility] [phone]

## 2024-05-12 ENCOUNTER — Telehealth: Payer: Self-pay

## 2024-05-12 NOTE — Telephone Encounter (Signed)
 2026 Renewals PAP: Patient assistance application for Trulicity  through [employer] has been mailed to pt's home address on file. Provider portion of application will be faxed to provider's office.  PAP: Patient assistance application for Toujeo  Solostar through [employer] has been mailed to pt's home address on file. Provider portion of application will be faxed to provider's office.    Patient states she has a large supple of novolog  and Airsupra  and does not wish to apply for PAP at this time.   Provider portion will be faxed to Dr. Abigail Free

## 2024-05-14 ENCOUNTER — Ambulatory Visit: Payer: Self-pay | Admitting: Family Medicine

## 2024-05-14 LAB — URINE CULTURE

## 2024-05-14 NOTE — Telephone Encounter (Signed)
 Received provider portion of patient assistance applications

## 2024-05-25 ENCOUNTER — Other Ambulatory Visit: Payer: Self-pay | Admitting: Family Medicine

## 2024-05-25 DIAGNOSIS — E1169 Type 2 diabetes mellitus with other specified complication: Secondary | ICD-10-CM

## 2024-05-26 ENCOUNTER — Encounter: Payer: Self-pay | Admitting: Family Medicine

## 2024-05-26 ENCOUNTER — Ambulatory Visit: Admitting: Family Medicine

## 2024-05-26 VITALS — BP 130/68 | HR 71 | Temp 97.2°F | Resp 16 | Ht 66.0 in | Wt 190.0 lb

## 2024-05-26 DIAGNOSIS — E782 Mixed hyperlipidemia: Secondary | ICD-10-CM

## 2024-05-26 DIAGNOSIS — I10 Essential (primary) hypertension: Secondary | ICD-10-CM | POA: Diagnosis not present

## 2024-05-26 DIAGNOSIS — Z794 Long term (current) use of insulin: Secondary | ICD-10-CM | POA: Diagnosis not present

## 2024-05-26 DIAGNOSIS — E1169 Type 2 diabetes mellitus with other specified complication: Secondary | ICD-10-CM | POA: Diagnosis not present

## 2024-05-26 NOTE — Progress Notes (Unsigned)
 Subjective:  Patient ID: Suzanne Hardin, female    DOB: Jan 30, 1956  Age: 68 y.o. MRN: [id number]  Chief Complaint  Patient presents with   Medical Management of Chronic Issues    HPI: Discussed the use of AI scribe software for clinical note transcription with the patient, who gave verbal consent to proceed.  History of Present Illness Suzanne Hardin is a 68 year old female who presents for routine follow-up and lab work.  Glycemic control and diabetes management - Morning blood glucose values range from 98 to 117 mg/dL. - Last hemoglobin A1c was 7%. - Diabetes managed with Toujeo  18 units in the morning and evening, and Trulicity  1.5 mg every five to six days. - Trulicity  does not maintain efficacy for a full seven days, so dosing interval is shortened. - Rare use of Novolog ; adequate supply for the year.  Weight loss and associated symptoms - Five-pound weight loss since July, attributed to dietary restrictions. - Increased sensitivity to cold. - Discomfort when sitting, related to weight loss.  Ophthalmologic history and neurological considerations - Cataract implants since age 26. - Annual eye examinations performed every February. - Neurologist has provided advice regarding eye exams and neurological health.  Constitutional and cardiopulmonary symptoms - No fevers, chills, or sweats. - No chest pain, headaches, dizziness, or shortness of breath. - No fluid retention in the feet.  Ear, nose, and throat symptoms - No earaches, sore throats, or stuffy nose.       10/11/2023   10:10 AM 02/12/2023    9:37 AM 09/06/2022   10:32 AM 08/17/2022    8:26 AM 05/15/2021   11:24 AM  Depression screen PHQ 2/9  Decreased Interest 0 0 0 0 0  Down, Depressed, Hopeless 0 0 0 0 0  PHQ - 2 Score 0 0 0 0 0        01/10/2024    9:13 AM  Fall Risk   Falls in the past year? 0    Patient Care Team: Sherre Clapper, MD as PCP - [facility]   Review of Systems  Constitutional:  Negative for chills, fatigue and fever.  HENT:  Negative for congestion, ear pain and sore throat.   Respiratory:  Negative for cough and shortness of breath.   Cardiovascular:  Negative for chest pain.  Gastrointestinal:  Negative for abdominal pain, constipation, diarrhea, nausea and vomiting.  Genitourinary:  Negative for dysuria and urgency.  Musculoskeletal:  Negative for arthralgias and myalgias.  Skin:  Negative for rash.  Neurological:  Negative for dizziness and headaches.  Psychiatric/Behavioral:  Negative for dysphoric mood. The patient is not nervous/anxious.     Current Outpatient Medications on File Prior to Visit  Medication Sig Dispense Refill   Albuterol -Budesonide (AIRSUPRA ) 90-80 MCG/ACT AERO INHALE 2 PUFFS INTO THE LUNGS 4 TIMES DAILY AS NEEDED. 10.7 g 2   Alpha-Lipoic Acid 300 MG CAPS Take 1 capsule by mouth daily.     baclofen (LIORESAL) 10 MG tablet Take 10 mg by mouth 3 (three) times daily as needed.     Blood Glucose Calibration (ONETOUCH VERIO) SOLN 1 each by In Vitro route every 30 (thirty) days. 1 each 2   Blood Glucose Monitoring Suppl (ONETOUCH VERIO) w/Device KIT 1 each by Does not apply route 3 (three) times daily. 1 kit 0   chlorpheniramine (CHLOR-TRIMETON) 4 MG tablet Take 1 mg by mouth every 6 (six) hours as needed for allergies.  Cholecalciferol (VITAMIN D ) 125 MCG (5000 UT) CAPS Take 5,000 Units by mouth 2 (two) times a week.     CONTOUR NEXT TEST test strip USE AS DIRECTED 300 strip 2   Cyanocobalamin 1000 MCG TBCR Take 1 tablet by mouth every other day.     D-Mannose POWD Take by mouth as needed.     doxycycline  (VIBRA -TABS) 100 MG tablet Take 1 tablet (100 mg total) by mouth 2 (two) times daily. 14 tablet 0   hydrOXYzine (ATARAX) 10 MG/5ML syrup Take by mouth.     ibuprofen (ADVIL) 200 MG tablet Take 200 mg by mouth every 6 (six) hours as needed.     Lancets MISC by Does not apply route.      NOVOLOG  FLEXPEN 100 UNIT/ML FlexPen Patient uses slidding scale. Maximum 30 units uses only blood sugar >250mg /dl. Using 8-10 units when sugars are high lately. 15 mL 1   pyridOXINE (VITAMIN B-6) 50 MG tablet Take 50 mg by mouth every other day. Takes the p5pb6 version     TOUJEO  SOLOSTAR 300 UNIT/ML Solostar Pen Inject 23 Units into the skin daily. 6 pen 6   TRIAMCINOLONE ACETONIDE, TOP, 0.05 % OINT Apply topically daily as needed. Uses as needed for rash, bug bites or on gums before dentist.     TRULICITY  1.5 MG/0.5ML SOAJ INJECT 1.5 MG (0.5ML) UNDER THE SKIN TWICE A WEEK. MAX WEEKLY DOSE IS 3MG  16 mL 0   No current facility-administered medications on file prior to visit.   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 Food, (peanuts, bell peppers, raw onions,pickles) Drug, and Skin, environmental, fragrances & air freshners   Type 2 diabetes mellitus with other specified complication (HCC) 09/16/2019   Past Surgical History:  Procedure Laterality Date   APPENDECTOMY  2000   CATARACT EXTRACTION, BILATERAL     CHOLECYSTECTOMY  2000   TONSILLECTOMY AND ADENOIDECTOMY      Family History  Problem Relation Age of Onset   Ulcers Father    Peripheral Artery Disease Father    Breast cancer Maternal Aunt    Breast cancer Maternal Aunt    Breast cancer Cousin    Social History   Socioeconomic History   Marital status: Divorced    Spouse name: Not on file   Number of children: 2   Years of education: Not on file   Highest education level: Not on file   Occupational History   Occupation: Disabled  Tobacco Use   Smoking status: Never   Smokeless tobacco: Never  Vaping Use   Vaping status: Never Used  Substance and Sexual Activity   Alcohol use: Never   Drug use: Never   Sexual activity: Yes    Partners: Male  Other Topics Concern   Not on file  Social History Narrative   Not on file   Social Drivers of Health   Financial Resource Strain: Low Risk  (01/08/2024)   Overall Financial Resource Strain (CARDIA)    Difficulty of Paying Living Expenses: Not hard at all  Food Insecurity: No Food  Insecurity (12/12/2023)   Hunger Vital Sign    Worried About Running Out of Food in the Last Year: Never true    Ran Out of Food in the Last Year: Never true  Transportation Needs: No Transportation Needs (12/12/2023)   PRAPARE - Administrator, Civil Service (Medical): No    Lack of Transportation (Non-Medical): No  Physical Activity: Sufficiently Active (01/08/2024)   Exercise Vital Sign    Days of Exercise per Week: 5 days    Minutes of Exercise per Session: 150+ min  Stress: No Stress Concern Present (01/08/2024)   [employer] of Occupational Health - Occupational Stress Questionnaire    Feeling of Stress: Not at all  Social Connections: Moderately Integrated (01/08/2024)   Social Connection and Isolation Panel    Frequency of Communication with Friends and Family: More than three times a week    Frequency of Social Gatherings with Friends and Family: More than three times a week    Attends Religious Services: More than 4 times per year    Active Member of [employer] or Organizations: Yes    Attends Engineer, Structural: More than 4 times per year    Marital Status: Divorced    Objective:  BP 130/68   Pulse 71   Temp (!) 97.2 F (36.2 C)   Resp 16   Ht 5' 6 (1.676 m)   Wt 190 lb (86.2 kg)   SpO2 98%   BMI 30.67 kg/m      05/26/2024    2:12 PM 05/11/2024    9:50 AM 01/08/2024    9:15 AM  BP/Weight   Systolic BP 130 137 123  Diastolic BP 68 78 68  Wt. (Lbs) 190 191   BMI 30.67 kg/m2 30.83 kg/m2     Physical Exam Vitals reviewed.  Constitutional:      Appearance: Normal appearance. She is normal weight.  Neck:     Vascular: No carotid bruit.  Cardiovascular:     Rate and Rhythm: Normal rate and regular rhythm.     Pulses: Normal pulses.     Heart sounds: Normal heart sounds.  Pulmonary:     Effort: Pulmonary effort is normal. No respiratory distress.     Breath sounds: Normal breath sounds.  Abdominal:     General: Abdomen is flat. Bowel sounds are normal.     Palpations: Abdomen is soft.     Tenderness: There is no abdominal tenderness.  Neurological:     Mental Status: She is alert and oriented to person, place, and time.  Psychiatric:        Mood and Affect: Mood normal.        Behavior: Behavior normal.     {Perform Simple Foot Exam  Perform Detailed exam:1} Diabetic foot exam was performed with the following findings:   No deformities, ulcerations, or other skin breakdown Normal sensation of 10g monofilament Intact posterior tibialis and dorsalis pedis pulses      Lab Results  Component Value Date   WBC 7.6 05/27/2024   HGB 14.5 05/27/2024   HCT 42.1 05/27/2024   PLT 289 05/27/2024   GLUCOSE 92 05/27/2024   CHOL 184 05/27/2024   TRIG 49 05/27/2024   HDL 62 05/27/2024   LDLCALC 112 (H) 05/27/2024   ALT 14 05/27/2024   AST 22 05/27/2024   NA 142 05/27/2024   K 4.3 05/27/2024   CL 104 05/27/2024   CREATININE 0.88 05/27/2024   BUN 13 05/27/2024  CO2 25 05/27/2024   TSH 2.650 10/08/2023   HGBA1C 6.6 (H) 05/27/2024    Results for orders placed or performed in visit on 05/11/24  Urine Culture   Collection Time: 05/11/24  1:12 PM   Specimen: Urine   UR  Result Value Ref Range   Urine Culture, Routine Final report (A)    Organism ID, Bacteria Escherichia coli (A)    Antimicrobial Susceptibility Comment   .  Assessment & Plan:   Assessment  & Plan Type 2 diabetes mellitus with other specified complication, with long-term current use of insulin (HCC) Improved morning blood glucose levels. Current regimen effective. Last A1c at 7%. - Continue Toujeo  18 units BID and Trulicity  1.5 mg every 5-6 days. - Order A1c test with fasting blood draw. - Perform urine test for proteinuria.    Mixed hyperlipidemia Previous cholesterol panel unremarkable. Fasting status affects triglyceride and LDL levels. - Schedule fasting blood draw for cholesterol panel.      Body mass index is 30.67 kg/m.    No orders of the defined types were placed in this encounter.   No orders of the defined types were placed in this encounter.    Suzanne Hardin,acting as a scribe for Abigail Free, MD.,have documented all relevant documentation on the behalf of Abigail Free, MD,as directed by  Abigail Free, MD while in the presence of Abigail Free, MD.   Follow-up: Return in about 1 day (around 05/27/2024) for lab visit fasting. 3 1/2 month follow up fasting. SABRA  An After Visit Summary was printed and given to the patient.  Abigail Free, MD [facility] [phone]

## 2024-05-27 ENCOUNTER — Other Ambulatory Visit

## 2024-05-27 DIAGNOSIS — E1169 Type 2 diabetes mellitus with other specified complication: Secondary | ICD-10-CM

## 2024-05-27 DIAGNOSIS — I10 Essential (primary) hypertension: Secondary | ICD-10-CM

## 2024-05-27 LAB — CBC WITH DIFFERENTIAL/PLATELET
Basophils Absolute: 0.1 x10E3/uL (ref 0.0–0.2)
Basos: 1 %
EOS (ABSOLUTE): 0.4 x10E3/uL (ref 0.0–0.4)
Eos: 5 %
Hematocrit: 42.1 % (ref 34.0–46.6)
Hemoglobin: 14.5 g/dL (ref 11.1–15.9)
Immature Grans (Abs): 0 x10E3/uL (ref 0.0–0.1)
Immature Granulocytes: 0 %
Lymphocytes Absolute: 3.2 x10E3/uL — ABNORMAL HIGH (ref 0.7–3.1)
Lymphs: 42 %
MCH: 30.9 pg (ref 26.6–33.0)
MCHC: 34.4 g/dL (ref 31.5–35.7)
MCV: 90 fL (ref 79–97)
Monocytes Absolute: 0.4 x10E3/uL (ref 0.1–0.9)
Monocytes: 6 %
Neutrophils Absolute: 3.6 x10E3/uL (ref 1.4–7.0)
Neutrophils: 46 %
Platelets: 289 x10E3/uL (ref 150–450)
RBC: 4.69 x10E6/uL (ref 3.77–5.28)
RDW: 13 % (ref 11.7–15.4)
WBC: 7.6 x10E3/uL (ref 3.4–10.8)

## 2024-05-27 LAB — CMP14+EGFR
ALT: 14 IU/L (ref 0–32)
AST: 22 IU/L (ref 0–40)
Albumin: 4.4 g/dL (ref 3.9–4.9)
Alkaline Phosphatase: 83 IU/L (ref 49–135)
BUN/Creatinine Ratio: 15 (ref 12–28)
BUN: 13 mg/dL (ref 8–27)
Bilirubin Total: 0.7 mg/dL (ref 0.0–1.2)
CO2: 25 mmol/L (ref 20–29)
Calcium: 9.6 mg/dL (ref 8.7–10.3)
Chloride: 104 mmol/L (ref 96–106)
Creatinine, Ser: 0.88 mg/dL (ref 0.57–1.00)
Globulin, Total: 2.1 g/dL (ref 1.5–4.5)
Glucose: 92 mg/dL (ref 70–99)
Potassium: 4.3 mmol/L (ref 3.5–5.2)
Sodium: 142 mmol/L (ref 134–144)
Total Protein: 6.5 g/dL (ref 6.0–8.5)
eGFR: 72 mL/min/1.73 (ref >59)

## 2024-05-27 LAB — LIPID PANEL
Chol/HDL Ratio: 3 ratio (ref 0.0–4.4)
Cholesterol, Total: 184 mg/dL (ref 100–199)
HDL: 62 mg/dL (ref >39)
LDL Chol Calc (NIH): 112 mg/dL — ABNORMAL HIGH (ref 0–99)
Triglycerides: 49 mg/dL (ref 0–149)
VLDL Cholesterol Cal: 10 mg/dL (ref 5–40)

## 2024-05-27 LAB — HEMOGLOBIN A1C
Est. average glucose Bld gHb Est-mCnc: 143 mg/dL
Hgb A1c MFr Bld: 6.6 % — ABNORMAL HIGH (ref 4.8–5.6)

## 2024-05-28 ENCOUNTER — Ambulatory Visit: Payer: Self-pay | Admitting: Family Medicine

## 2024-05-28 DIAGNOSIS — E1121 Type 2 diabetes mellitus with diabetic nephropathy: Secondary | ICD-10-CM

## 2024-05-29 LAB — MICROALBUMIN / CREATININE URINE RATIO
Creatinine, Urine: 178.3 mg/dL
Microalb/Creat Ratio: 48 mg/g{creat} — ABNORMAL HIGH (ref 0–29)
Microalbumin, Urine: 85.3 ug/mL

## 2024-05-30 DIAGNOSIS — E119 Type 2 diabetes mellitus without complications: Secondary | ICD-10-CM | POA: Insufficient documentation

## 2024-05-30 NOTE — Assessment & Plan Note (Signed)
 Improved morning blood glucose levels. Current regimen effective. Last A1c at 7%. - Continue Toujeo  18 units BID and Trulicity  1.5 mg every 5-6 days. - Order A1c test with fasting blood draw. - Perform urine test for proteinuria.

## 2024-05-30 NOTE — Assessment & Plan Note (Signed)
 Previous cholesterol panel unremarkable. Fasting status affects triglyceride and LDL levels. - Schedule fasting blood draw for cholesterol panel.

## 2024-05-31 NOTE — Assessment & Plan Note (Signed)
Controlled off medicines.  Continue to work on eating a healthy diet and exercise.

## 2024-06-02 NOTE — Telephone Encounter (Signed)
 PAP: Application for Toujeo  has been submitted to [employer], via fax  PAP: Application for Trulicity  has been submitted to [employer], via fax

## 2024-06-03 NOTE — Telephone Encounter (Signed)
 PAP: Patient assistance application for Toujeo  has been approved by PAP Companies: [employer] from 06/25/2024 to 06/24/2025. Medication should be delivered to PAP Delivery: Provider's office. For further shipping updates, please contact [employer] at [phone]. Patient ID is: [id number]

## 2024-06-10 ENCOUNTER — Telehealth: Payer: Self-pay | Admitting: Family Medicine

## 2024-06-10 NOTE — Telephone Encounter (Signed)
 Called patient to inform her of meds to be picked up. Had to leave message.

## 2024-06-10 NOTE — Telephone Encounter (Signed)
 Patient picked up patient assistance. Trulicity 

## 2024-06-11 ENCOUNTER — Telehealth: Payer: Self-pay | Admitting: Family Medicine

## 2024-06-11 NOTE — Telephone Encounter (Signed)
 Called patient and informed her of her patient assist, Toujeo , is ready for pickup.

## 2024-06-11 NOTE — Telephone Encounter (Signed)
 Patient picked up patient assistance

## 2024-06-22 NOTE — Telephone Encounter (Signed)
 Received reuest from [employer] asking for Proof of income.  [employer] Social Security statement for 2026

## 2024-06-24 ENCOUNTER — Telehealth: Payer: Self-pay

## 2024-06-24 NOTE — Telephone Encounter (Signed)
 Copied from CRM #[id number]. Topic: General - Other >> Jun 24, 2024  9:38 AM Wess RAMAN wrote: Reason for CRM: Patient would like her application for [facility] for Trulicity  re-faxed. Cover letter needs to include patient's name, address, and proof of income. She would also like a call when the task is completed.  Callback #: [id number] Fax #: [phone]

## 2024-06-26 NOTE — Telephone Encounter (Signed)
 PAP: Patient assistance application for Trulicity  has been approved by PAP Companies: [employer] from 06/25/2024 to 06/24/2025. Medication should be delivered to PAP Delivery: Home. For further shipping updates, please contact [employer] at [phone]. Patient ID is: [id number]

## 2024-06-30 LAB — COLOGUARD: COLOGUARD: NEGATIVE

## 2024-07-06 ENCOUNTER — Encounter: Payer: Self-pay | Admitting: Physician Assistant

## 2024-07-06 ENCOUNTER — Ambulatory Visit: Admitting: Physician Assistant

## 2024-07-06 VITALS — BP 125/71 | HR 71 | Temp 97.5°F | Ht 66.0 in | Wt 186.8 lb

## 2024-07-06 DIAGNOSIS — G243 Spasmodic torticollis: Secondary | ICD-10-CM

## 2024-07-06 DIAGNOSIS — R3129 Other microscopic hematuria: Secondary | ICD-10-CM

## 2024-07-06 DIAGNOSIS — R300 Dysuria: Secondary | ICD-10-CM

## 2024-07-06 LAB — POCT URINALYSIS DIP (CLINITEK)
Bilirubin, UA: NEGATIVE
Glucose, UA: NEGATIVE mg/dL
Ketones, POC UA: NEGATIVE mg/dL
Nitrite, UA: NEGATIVE
POC PROTEIN,UA: 30 — AB
Spec Grav, UA: >=1.03 — AB
Urobilinogen, UA: 0.2 U/dL
pH, UA: 6

## 2024-07-06 MED ORDER — CIPROFLOXACIN HCL 500 MG PO TABS: 500.0000 mg | ORAL_TABLET | Freq: Two times a day (BID) | ORAL | 0 refills | Status: AC

## 2024-07-06 NOTE — Progress Notes (Unsigned)
 "  Acute Office Visit  Subjective:    Patient ID: Suzanne Hardin, female    DOB: Aug 04, 1955, 69 y.o.   MRN: [id number]  Chief Complaint  Patient presents with   UTI    HPI: Patient is in today for dysuria.   Discussed the use of AI scribe software for clinical note transcription with the patient, who gave verbal consent to proceed.  Cranial dystonia Laryngeal dystonia.  Discussed the use of AI scribe software for clinical note transcription with the patient, who gave verbal consent to proceed.  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 Food, (peanuts, bell peppers, raw onions,pickles) Drug, and Skin, environmental, fragrances & air freshners   Type 2 diabetes mellitus with other specified complication (HCC) 09/16/2019    Past Surgical History:  Procedure Laterality Date   APPENDECTOMY  2000   CATARACT EXTRACTION, BILATERAL     CHOLECYSTECTOMY  2000   TONSILLECTOMY AND ADENOIDECTOMY      Family History  Problem Relation Age of Onset   Ulcers Father    Peripheral Artery Disease Father    Breast cancer Maternal Aunt    Breast cancer Maternal Aunt    Breast cancer Cousin     Social History   Socioeconomic History   Marital status: Divorced    Spouse name: Not on file   Number of children: 2   Years of education: Not on file   Highest education level: Not on file  Occupational History   Occupation: Disabled  Tobacco Use   Smoking status: Never   Smokeless tobacco: Never  Vaping Use   Vaping  status: Never Used  Substance and Sexual Activity   Alcohol use: Never   Drug use: Never   Sexual activity: Yes    Partners: Male  Other Topics Concern   Not on file  Social History Narrative   Not on file   Social Drivers of Health   Tobacco Use: Low Risk (07/06/2024)   Patient History    Smoking Tobacco Use: Never    Smokeless Tobacco Use: Never    Passive Exposure: Not on file  Financial Resource Strain: Low Risk (01/08/2024)   Overall Financial Resource Strain (CARDIA)    Difficulty of Paying Living Expenses: Not hard at all  Food Insecurity: No Food Insecurity (12/12/2023)   Epic    Worried About Programme Researcher, Broadcasting/film/video in the Last Year: Never true    Ran Out of Food in the Last Year: Never true  Transportation Needs: No Transportation Needs (12/12/2023)   Epic    Lack of Transportation (Medical): No    Lack of Transportation (Non-Medical): No  Physical Activity: Sufficiently Active (01/08/2024)   Exercise Vital Sign    Days of Exercise per Week: 5 days    Minutes of Exercise per Session: 150+ min  Stress: No Stress Concern Present (01/08/2024)   [employer] of Occupational Health - Occupational Stress Questionnaire    Feeling of Stress: Not at all  Social Connections: Moderately Integrated (  01/08/2024)   Social Connection and Isolation Panel    Frequency of Communication with Friends and Family: More than three times a week    Frequency of Social Gatherings with Friends and Family: More than three times a week    Attends Religious Services: More than 4 times per year    Active Member of Clubs or Organizations: Yes    Attends Banker Meetings: More than 4 times per year    Marital Status: Divorced  Intimate Partner Violence: Not At Risk (12/12/2023)   Epic    Fear of Current or Ex-Partner: No    Emotionally Abused: No    Physically Abused: No    Sexually Abused: No  Depression (PHQ2-9): Low Risk (10/11/2023)   Depression (PHQ2-9)    PHQ-2 Score: 0   Alcohol Screen: Low Risk (01/08/2024)   Alcohol Screen    Last Alcohol Screening Score (AUDIT): 0  Housing: Low Risk (01/08/2024)   Epic    Unable to Pay for Housing in the Last Year: No    Number of Times Moved in the Last Year: 0    Homeless in the Last Year: No  Utilities: Not At Risk (12/12/2023)   Epic    Threatened with loss of utilities: No  Health Literacy: Adequate Health Literacy (01/08/2024)   B1300 Health Literacy    Frequency of need for help with medical instructions: Never    Outpatient Medications Prior to Visit  Medication Sig Dispense Refill   Albuterol -Budesonide (AIRSUPRA ) 90-80 MCG/ACT AERO INHALE 2 PUFFS INTO THE LUNGS 4 TIMES DAILY AS NEEDED. 10.7 g 2   Alpha-Lipoic Acid 300 MG CAPS Take 1 capsule by mouth daily.     baclofen (LIORESAL) 10 MG tablet Take 10 mg by mouth 3 (three) times daily as needed.     Blood Glucose Calibration (ONETOUCH VERIO) SOLN 1 each by In Vitro route every 30 (thirty) days. 1 each 2   Blood Glucose Monitoring Suppl (ONETOUCH VERIO) w/Device KIT 1 each by Does not apply route 3 (three) times daily. 1 kit 0   chlorpheniramine (CHLOR-TRIMETON) 4 MG tablet Take 1 mg by mouth every 6 (six) hours as needed for allergies.     Cholecalciferol (VITAMIN D ) 125 MCG (5000 UT) CAPS Take 5,000 Units by mouth 2 (two) times a week.     CONTOUR NEXT TEST test strip USE AS DIRECTED 300 strip 2   Cyanocobalamin 1000 MCG TBCR Take 1 tablet by mouth every other day.     D-Mannose POWD Take by mouth as needed.     doxycycline  (VIBRA -TABS) 100 MG tablet Take 1 tablet (100 mg total) by mouth 2 (two) times daily. 14 tablet 0   hydrOXYzine (ATARAX) 10 MG/5ML syrup Take by mouth.     ibuprofen (ADVIL) 200 MG tablet Take 200 mg by mouth every 6 (six) hours as needed.     Lancets MISC by Does not apply route.     NOVOLOG  FLEXPEN 100 UNIT/ML FlexPen Patient uses slidding scale. Maximum 30 units uses only blood sugar >250mg /dl. Using 8-10 units when sugars are high  lately. 15 mL 1   pyridOXINE (VITAMIN B-6) 50 MG tablet Take 50 mg by mouth every other day. Takes the p5pb6 version     TOUJEO  SOLOSTAR 300 UNIT/ML Solostar Pen Inject 23 Units into the skin daily. 6 pen 6   TRIAMCINOLONE ACETONIDE, TOP, 0.05 % OINT Apply topically daily as needed. Uses as needed for rash, bug bites or on gums before dentist.  TRULICITY  1.5 MG/0.5ML SOAJ INJECT 1.5 MG (0.5ML) UNDER THE SKIN TWICE A WEEK. MAX WEEKLY DOSE IS 3MG  16 mL 0   No facility-administered medications prior to visit.    Allergies[1]  Review of Systems  Constitutional:  Negative for appetite change, fatigue and fever.  HENT:  Negative for congestion, ear pain, sinus pressure and sore throat.   Respiratory:  Negative for cough, chest tightness, shortness of breath and wheezing.   Cardiovascular:  Negative for chest pain and palpitations.  Gastrointestinal:  Negative for abdominal pain, constipation, diarrhea, nausea and vomiting.  Genitourinary:  Positive for dysuria. Negative for hematuria.  Musculoskeletal:  Negative for arthralgias, back pain, joint swelling and myalgias.  Skin:  Negative for rash.  Neurological:  Negative for dizziness, weakness and headaches.  Psychiatric/Behavioral:  Negative for dysphoric mood. The patient is not nervous/anxious.        Objective:        07/06/2024    9:18 AM 05/26/2024    2:12 PM 05/11/2024    9:50 AM  Vitals with BMI  Height 5' 6 5' 6 5' 6  Weight 186 lbs 13 oz 190 lbs 191 lbs  BMI 30.16 30.68 30.84  Systolic 125 130 862  Diastolic 71 68 78  Pulse 71 71 81    Orthostatic VS for the past 72 hrs (Last 3 readings):  Patient Position BP Location  07/06/24 0918 Sitting Left Arm     Physical Exam Vitals reviewed.  Constitutional:      Appearance: Normal appearance.  Neck:     Vascular: No carotid bruit.  Cardiovascular:     Rate and Rhythm: Normal rate and regular rhythm.     Heart sounds: Normal heart sounds.  Pulmonary:      Effort: Pulmonary effort is normal.     Breath sounds: Normal breath sounds.  Abdominal:     General: Bowel sounds are normal.     Palpations: Abdomen is soft.     Tenderness: There is no abdominal tenderness.  Neurological:     Mental Status: She is alert and oriented to person, place, and time.  Psychiatric:        Mood and Affect: Mood normal.        Behavior: Behavior normal.     Health Maintenance Due  Topic Date Due   OPHTHALMOLOGY EXAM  08/03/2023    There are no preventive care reminders to display for this patient.   Lab Results  Component Value Date   TSH 2.650 10/08/2023   Lab Results  Component Value Date   WBC 7.6 05/27/2024   HGB 14.5 05/27/2024   HCT 42.1 05/27/2024   MCV 90 05/27/2024   PLT 289 05/27/2024   Lab Results  Component Value Date   NA 142 05/27/2024   K 4.3 05/27/2024   CO2 25 05/27/2024   GLUCOSE 92 05/27/2024   BUN 13 05/27/2024   CREATININE 0.88 05/27/2024   BILITOT 0.7 05/27/2024   ALKPHOS 83 05/27/2024   AST 22 05/27/2024   ALT 14 05/27/2024   PROT 6.5 05/27/2024   ALBUMIN 4.4 05/27/2024   CALCIUM 9.6 05/27/2024   EGFR 72 05/27/2024   Lab Results  Component Value Date   CHOL 184 05/27/2024   Lab Results  Component Value Date   HDL 62 05/27/2024   Lab Results  Component Value Date   LDLCALC 112 (H) 05/27/2024   Lab Results  Component Value Date   TRIG 49 05/27/2024   Lab Results  Component Value Date   CHOLHDL 3.0 05/27/2024   Lab Results  Component Value Date   HGBA1C 6.6 (H) 05/27/2024        Results for orders placed or performed in visit on 05/28/24  Microalbumin / creatinine urine ratio   Collection Time: 05/28/24  4:08 PM  Result Value Ref Range   Creatinine, Urine 178.3 Not Estab. mg/dL   Microalbumin, Urine 14.6 Not Estab. ug/mL   Microalb/Creat Ratio 48 (H) 0 - 29 mg/g creat     Assessment & Plan:   Assessment & Plan Dysuria  Orders:   ciprofloxacin  (CIPRO ) 500 MG tablet; Take 1 tablet  (500 mg total) by mouth 2 (two) times daily for 10 days.     Body mass index is 30.15 kg/m.SABRA  Assessment and Plan      No orders of the defined types were placed in this encounter.   No orders of the defined types were placed in this encounter.    Follow-up: No follow-ups on file.  An After Visit Summary was printed and given to the patient.    I,Lauren M Auman,acting as a neurosurgeon for [employer].,have documented all relevant documentation on the behalf of Nola Angles, PA,as directed by  Nola Angles, PA while in the presence of Nola Angles, [place].    Nola Angles, [place] [facility] [phone]     [1]  Allergies Allergen Reactions   Dextromethorphan-Guaifenesin Itching   Dorzolamide Hcl-Timolol Mal Nausea And Vomiting    Brachycardia, severe headache, swelling Other reaction(s): Other (See Comments) Brachycardia, severe headache, swelling Brachycardia, severe headache, swelling   Hydrocodone Other (See Comments)    Inflammatory Inflammatory Other reaction(s): Other (See Comments) Inflammatory Inflammatory    Lidocaine Swelling   Other Rash, Other (See Comments) and Swelling    Neurological damage fragances fragances  Neurological damage Fragances; YELLOW NUMBER 5;  Yellow #5 Yellow #5 Yellow #5 Yellow #5  High eye pressure High eye pressure High eye pressure    Sodium Laureth Sulfate Dermatitis, Hives, Itching, Other (See Comments), Rash and Swelling   Sulfa Antibiotics Hives   Sulfites Shortness Of Breath    Other reaction(s): Shortness of Breath Other reaction(s): Shortness of Breath    Statins Hives and Other (See Comments)   Articaine-Epinephrine Swelling   Cymbalta [Duloxetine Hcl]    Darvon [Propoxyphene]    Duloxetine    Ezetimibe    Gentamicin Sulfate    Hctz [Hydrochlorothiazide]    Humalog [Insulin Lispro]    Keflex [Cephalexin]    Lasix [Furosemide]    Lisinopril-Hydrochlorothiazide    Loratadine    Losartan     Macrodantin [Nitrofurantoin]    Metformin    Mucinex [Guaifenesin Er]    Peanut-Containing Drug Products    Penicillins    Pioglitazone    Tessalon Perles [Benzonatate]    Travatan [Travoprost]    Tylenol [Acetaminophen]    Propofol Other (See Comments) and Nausea And Vomiting   Sulfamethoxazole-Trimethoprim Rash   "

## 2024-07-09 ENCOUNTER — Ambulatory Visit: Payer: Self-pay | Admitting: Physician Assistant

## 2024-07-09 LAB — URINE CULTURE

## 2024-07-09 NOTE — Assessment & Plan Note (Signed)
 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 Botox effective but delayed. Baclofen and ibuprofen partially effective. - Continue Botox treatment as scheduled. - Continue baclofen and ibuprofen for symptom management. - Ensure Botox administration does not exceed 92 days between doses.

## 2024-08-27 ENCOUNTER — Ambulatory Visit: Admitting: Family Medicine
